# Patient Record
Sex: Female | Born: 1960
Health system: Southern US, Community
[De-identification: ages and names within clinical notes are randomized; demographics above are authoritative.]

## PROBLEM LIST (undated history)

## (undated) DIAGNOSIS — Z780 Asymptomatic menopausal state: Secondary | ICD-10-CM

## (undated) DIAGNOSIS — R011 Cardiac murmur, unspecified: Secondary | ICD-10-CM

## (undated) DIAGNOSIS — F32A Depression, unspecified: Secondary | ICD-10-CM

## (undated) DIAGNOSIS — R232 Flushing: Secondary | ICD-10-CM

## (undated) DIAGNOSIS — F329 Major depressive disorder, single episode, unspecified: Secondary | ICD-10-CM

## (undated) DIAGNOSIS — F419 Anxiety disorder, unspecified: Secondary | ICD-10-CM

## (undated) DIAGNOSIS — K922 Gastrointestinal hemorrhage, unspecified: Secondary | ICD-10-CM

## (undated) DIAGNOSIS — M1990 Unspecified osteoarthritis, unspecified site: Secondary | ICD-10-CM

## (undated) DIAGNOSIS — K259 Gastric ulcer, unspecified as acute or chronic, without hemorrhage or perforation: Secondary | ICD-10-CM

## (undated) DIAGNOSIS — N816 Rectocele: Secondary | ICD-10-CM

## (undated) DIAGNOSIS — G4700 Insomnia, unspecified: Secondary | ICD-10-CM

## (undated) DIAGNOSIS — K2970 Gastritis, unspecified, without bleeding: Secondary | ICD-10-CM

## (undated) DIAGNOSIS — M75102 Unspecified rotator cuff tear or rupture of left shoulder, not specified as traumatic: Secondary | ICD-10-CM

## (undated) DIAGNOSIS — R519 Headache, unspecified: Secondary | ICD-10-CM

## (undated) DIAGNOSIS — K219 Gastro-esophageal reflux disease without esophagitis: Secondary | ICD-10-CM

## (undated) DIAGNOSIS — D352 Benign neoplasm of pituitary gland: Secondary | ICD-10-CM

## (undated) DIAGNOSIS — N950 Postmenopausal bleeding: Secondary | ICD-10-CM

## (undated) DIAGNOSIS — Z8619 Personal history of other infectious and parasitic diseases: Secondary | ICD-10-CM

## (undated) DIAGNOSIS — K7689 Other specified diseases of liver: Secondary | ICD-10-CM

## (undated) DIAGNOSIS — K209 Esophagitis, unspecified without bleeding: Secondary | ICD-10-CM

## (undated) DIAGNOSIS — K529 Noninfective gastroenteritis and colitis, unspecified: Secondary | ICD-10-CM

## (undated) DIAGNOSIS — R61 Generalized hyperhidrosis: Secondary | ICD-10-CM

## (undated) DIAGNOSIS — Z79890 Hormone replacement therapy (postmenopausal): Secondary | ICD-10-CM

## (undated) DIAGNOSIS — T7840XA Allergy, unspecified, initial encounter: Secondary | ICD-10-CM

## (undated) DIAGNOSIS — R9389 Abnormal findings on diagnostic imaging of other specified body structures: Secondary | ICD-10-CM

## (undated) DIAGNOSIS — IMO0001 Reserved for inherently not codable concepts without codable children: Secondary | ICD-10-CM

## (undated) DIAGNOSIS — K5909 Other constipation: Secondary | ICD-10-CM

## (undated) DIAGNOSIS — D219 Benign neoplasm of connective and other soft tissue, unspecified: Secondary | ICD-10-CM

## (undated) DIAGNOSIS — D649 Anemia, unspecified: Secondary | ICD-10-CM

## (undated) DIAGNOSIS — Z9889 Other specified postprocedural states: Secondary | ICD-10-CM

## (undated) DIAGNOSIS — R4589 Other symptoms and signs involving emotional state: Secondary | ICD-10-CM

## (undated) HISTORY — DX: Reserved for inherently not codable concepts without codable children: IMO0001

## (undated) HISTORY — DX: Hormone replacement therapy: Z79.890

## (undated) HISTORY — DX: Rectocele: N81.6

## (undated) HISTORY — DX: Benign neoplasm of connective and other soft tissue, unspecified: D21.9

## (undated) HISTORY — DX: Depression, unspecified: F32.A

## (undated) HISTORY — DX: Allergy, unspecified, initial encounter: T78.40XA

## (undated) HISTORY — DX: Flushing: R23.2

## (undated) HISTORY — DX: Other specified diseases of liver: K76.89

## (undated) HISTORY — PX: APPENDECTOMY: SHX54

## (undated) HISTORY — DX: Esophagitis, unspecified without bleeding: K20.90

## (undated) HISTORY — DX: Major depressive disorder, single episode, unspecified: F32.9

## (undated) HISTORY — DX: Other symptoms and signs involving emotional state: R45.89

## (undated) HISTORY — DX: Generalized hyperhidrosis: R61

## (undated) HISTORY — DX: Gastritis, unspecified, without bleeding: K29.70

## (undated) HISTORY — DX: Other constipation: K59.09

## (undated) HISTORY — DX: Anxiety disorder, unspecified: F41.9

## (undated) HISTORY — DX: Postmenopausal bleeding: N95.0

## (undated) HISTORY — DX: Anemia, unspecified: D64.9

## (undated) HISTORY — DX: Abnormal findings on diagnostic imaging of other specified body structures: R93.89

## (undated) HISTORY — DX: Personal history of other infectious and parasitic diseases: Z86.19

## (undated) HISTORY — DX: Asymptomatic menopausal state: Z78.0

## (undated) HISTORY — DX: Insomnia, unspecified: G47.00

## (undated) HISTORY — DX: Unspecified osteoarthritis, unspecified site: M19.90

## (undated) HISTORY — DX: Gastro-esophageal reflux disease without esophagitis: K21.9

## (undated) HISTORY — DX: Cardiac murmur, unspecified: R01.1

## (undated) HISTORY — PX: TONSILECTOMY, ADENOIDECTOMY, BILATERAL MYRINGOTOMY AND TUBES: SHX2538

## (undated) HISTORY — DX: Noninfective gastroenteritis and colitis, unspecified: K52.9

## (undated) HISTORY — PX: CARPAL TUNNEL RELEASE: SHX101

## (undated) HISTORY — DX: Benign neoplasm of pituitary gland: D35.2

## (undated) HISTORY — DX: Gastric ulcer, unspecified as acute or chronic, without hemorrhage or perforation: K25.9

## (undated) HISTORY — PX: ABDOMINAL SURGERY: SHX537

## (undated) HISTORY — PX: UPPER GASTROINTESTINAL ENDOSCOPY: SHX188

---
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
## 1994-01-05 HISTORY — PX: BREAST ENHANCEMENT SURGERY: SHX7

## 1998-01-05 HISTORY — PX: AUGMENTATION MAMMAPLASTY: SUR837

## 1999-04-22 ENCOUNTER — Other Ambulatory Visit: Admission: RE | Admit: 1999-04-22 | Discharge: 1999-04-22 | Payer: Self-pay | Admitting: Family Medicine

## 2000-11-25 ENCOUNTER — Other Ambulatory Visit: Admission: RE | Admit: 2000-11-25 | Discharge: 2000-11-25 | Payer: Self-pay | Admitting: Family Medicine

## 2004-03-27 ENCOUNTER — Other Ambulatory Visit: Admission: RE | Admit: 2004-03-27 | Discharge: 2004-03-27 | Payer: Self-pay | Admitting: Family Medicine

## 2004-04-14 ENCOUNTER — Encounter: Admission: RE | Admit: 2004-04-14 | Discharge: 2004-04-14 | Payer: Self-pay | Admitting: Family Medicine

## 2005-05-07 ENCOUNTER — Other Ambulatory Visit: Admission: RE | Admit: 2005-05-07 | Discharge: 2005-05-07 | Payer: Self-pay | Admitting: Family Medicine

## 2005-05-14 ENCOUNTER — Encounter: Admission: RE | Admit: 2005-05-14 | Discharge: 2005-05-14 | Payer: Self-pay | Admitting: Family Medicine

## 2006-01-14 ENCOUNTER — Encounter: Admission: RE | Admit: 2006-01-14 | Discharge: 2006-01-14 | Payer: Self-pay | Admitting: Family Medicine

## 2007-04-21 ENCOUNTER — Encounter: Admission: RE | Admit: 2007-04-21 | Discharge: 2007-04-21 | Payer: Self-pay | Admitting: Family Medicine

## 2008-05-07 ENCOUNTER — Encounter: Admission: RE | Admit: 2008-05-07 | Discharge: 2008-05-07 | Payer: Self-pay | Admitting: Unknown Physician Specialty

## 2008-10-02 ENCOUNTER — Ambulatory Visit: Payer: Self-pay | Admitting: Gastroenterology

## 2008-10-18 ENCOUNTER — Ambulatory Visit: Payer: Self-pay | Admitting: Gastroenterology

## 2008-10-19 ENCOUNTER — Telehealth: Payer: Self-pay | Admitting: Gastroenterology

## 2008-12-21 ENCOUNTER — Telehealth: Payer: Self-pay | Admitting: Gastroenterology

## 2009-01-25 ENCOUNTER — Telehealth: Payer: Self-pay | Admitting: Gastroenterology

## 2009-03-04 ENCOUNTER — Telehealth: Payer: Self-pay | Admitting: Gastroenterology

## 2009-03-12 ENCOUNTER — Ambulatory Visit: Payer: Self-pay | Admitting: Gastroenterology

## 2009-03-18 ENCOUNTER — Telehealth: Payer: Self-pay | Admitting: Gastroenterology

## 2009-03-20 ENCOUNTER — Ambulatory Visit (HOSPITAL_COMMUNITY): Admission: RE | Admit: 2009-03-20 | Discharge: 2009-03-20 | Payer: Self-pay | Admitting: Gastroenterology

## 2009-03-20 ENCOUNTER — Ambulatory Visit: Payer: Self-pay | Admitting: Gastroenterology

## 2009-03-21 ENCOUNTER — Telehealth: Payer: Self-pay | Admitting: Gastroenterology

## 2009-03-21 ENCOUNTER — Encounter (INDEPENDENT_AMBULATORY_CARE_PROVIDER_SITE_OTHER): Payer: Self-pay | Admitting: *Deleted

## 2009-03-26 ENCOUNTER — Telehealth: Payer: Self-pay | Admitting: Gastroenterology

## 2009-03-26 ENCOUNTER — Ambulatory Visit: Payer: Self-pay | Admitting: Cardiology

## 2009-05-17 ENCOUNTER — Telehealth: Payer: Self-pay | Admitting: Gastroenterology

## 2009-08-19 ENCOUNTER — Encounter (INDEPENDENT_AMBULATORY_CARE_PROVIDER_SITE_OTHER): Payer: Self-pay | Admitting: *Deleted

## 2009-08-19 ENCOUNTER — Telehealth: Payer: Self-pay | Admitting: Gastroenterology

## 2009-09-02 ENCOUNTER — Telehealth: Payer: Self-pay | Admitting: Gastroenterology

## 2009-09-03 ENCOUNTER — Ambulatory Visit: Payer: Self-pay | Admitting: Gastroenterology

## 2009-09-03 ENCOUNTER — Telehealth: Payer: Self-pay | Admitting: Physician Assistant

## 2009-09-03 ENCOUNTER — Ambulatory Visit: Payer: Self-pay | Admitting: Cardiovascular Disease

## 2009-09-03 LAB — CONVERTED CEMR LAB
Basophils Absolute: 0 10*3/uL (ref 0.0–0.1)
Eosinophils Relative: 1.9 % (ref 0.0–5.0)
Hemoglobin: 13.7 g/dL (ref 12.0–15.0)
Lymphs Abs: 1.6 10*3/uL (ref 0.7–4.0)
MCV: 98.3 fL (ref 78.0–100.0)
Monocytes Relative: 8.1 % (ref 3.0–12.0)
Neutro Abs: 3.4 10*3/uL (ref 1.4–7.7)
WBC: 5.6 10*3/uL (ref 4.5–10.5)

## 2009-09-04 ENCOUNTER — Telehealth: Payer: Self-pay | Admitting: Physician Assistant

## 2009-09-30 ENCOUNTER — Encounter: Payer: Self-pay | Admitting: Gastroenterology

## 2009-09-30 ENCOUNTER — Encounter: Payer: Self-pay | Admitting: Physician Assistant

## 2009-09-30 ENCOUNTER — Ambulatory Visit: Payer: Self-pay | Admitting: Gastroenterology

## 2009-10-14 ENCOUNTER — Ambulatory Visit: Payer: Self-pay | Admitting: Gastroenterology

## 2009-10-17 ENCOUNTER — Encounter: Payer: Self-pay | Admitting: Gastroenterology

## 2009-10-18 ENCOUNTER — Encounter: Payer: Self-pay | Admitting: Gastroenterology

## 2009-10-25 ENCOUNTER — Telehealth: Payer: Self-pay | Admitting: Gastroenterology

## 2009-11-11 ENCOUNTER — Ambulatory Visit: Payer: Self-pay | Admitting: Gastroenterology

## 2009-11-11 DIAGNOSIS — K52839 Microscopic colitis, unspecified: Secondary | ICD-10-CM | POA: Insufficient documentation

## 2009-11-11 DIAGNOSIS — K219 Gastro-esophageal reflux disease without esophagitis: Secondary | ICD-10-CM | POA: Insufficient documentation

## 2009-11-12 ENCOUNTER — Telehealth: Payer: Self-pay | Admitting: Gastroenterology

## 2010-01-09 ENCOUNTER — Ambulatory Visit: Admit: 2010-01-09 | Payer: Self-pay | Admitting: Gastroenterology

## 2010-02-04 NOTE — Progress Notes (Signed)
Summary: CONDITION UPDATE-FLEX SCHEDULED  Phone Note Call from Patient Call back at [phone]   Caller: Patient Call For: Arlyce Dice Reason for Call: Talk to Nurse Action Taken: Patient advised to call 911 Summary of Call: Patient states that she was told to take antibiotics last Tues. and that if she did not notice any significant difference on how she felt by today to give [place] a call. Initial call taken by: Tawni Levy,  March 18, 2009 10:49 AM  Follow-up for Phone Call        F/U from OV 03-12-09--Pt. is still taking Cipro/Flagyl, continues with 4-6 watery stools daily,gas and cramping. Denies blood, black stools, fever. per OV note she needs a Sigmoidoscopy if symptoms haven't improved.   1) Flex at [facility] on 03-20-09 at 12:30pm 2) Continue antibiotics as directed 3) Continue Immodium as needed 4) If symptoms become worse call back immediately.  (All procedure instructions given to pt. by phone.) Follow-up by: Laureen Ochs LPN,  March 18, 2009 11:27 AM  Additional Follow-up for Phone Call Additional follow up Details #1::        ok Additional Follow-up by: Louis Meckel MD,  March 19, 2009 9:48 AM  New Problems: FLATULENCE ERUCTATION AND GAS PAIN (ICD-787.3)   New Problems: FLATULENCE ERUCTATION AND GAS PAIN (ICD-787.3)

## 2010-02-04 NOTE — Progress Notes (Signed)
Summary: medication info.  Phone Note Call from Patient Call back at Work Phone [phone]   Caller: Patient Call For: Mike Gip Reason for Call: Talk to Nurse Summary of Call: At our request, pt. called back with prescription information.  She takes Wellbutrin SR 150 mg. Initial call taken by: Schuyler Amor,  September 03, 2009 1:15 PM    New/Updated Medications: * WELLBUTRIN SR 150 MG 1 by mouth once daily

## 2010-02-04 NOTE — Letter (Signed)
Summary: Appt Reminder 2  [facility]  [address] [place], [place] [address]   Phone: [phone]  Fax: [phone]        August 19, 2009 MRN: [id number]    [facility] [address] [place], [place]  [address]    Dear Ms. Lalley,   You have a return appointment with Dr.Robert Arlyce Dice on 09-16-09 at 9:30am. Please remember to bring a complete list of the medicines you are taking, your insurance card and your co-pay.  If you have to cancel or reschedule this appointment, please call before 5:00 pm the evening before to avoid a cancellation fee.  If you have any questions or concerns, please call [phone].    Sincerely,    Laureen Ochs LPN  Appended Document: Appt Reminder 2 Letter mailed to patient.

## 2010-02-04 NOTE — Progress Notes (Signed)
Summary: results  Phone Note Call from Patient Call back at [phone]   Caller: Patient Call For: Dr Arlyce Dice Reason for Call: Talk to Nurse, Lab or Test Results Summary of Call: lab results Initial call taken by: Tawni Levy,  September 04, 2009 9:40 AM     Appended Document: results Please see CT report from 09-04-09 for follow-up.

## 2010-02-04 NOTE — Progress Notes (Signed)
Summary: Nexium authorization  Phone Note Outgoing Call Call back at [facility] Phone [phone]   Call placed by: Merri Ray CMA Duncan Dull),  November 12, 2009 1:32 PM Summary of Call: Called pt to inform that nexium needs prior authorization. Need to know if pt has been on another PPI. L/M for pt to R/C Initial call taken by: Merri Ray CMA Duncan Dull),  November 12, 2009 1:33 PM  Follow-up for Phone Call        Pt returned call, has not been on another PPI. Will send in Omeprazole for her to try for insuracne purposes Follow-up by: Merri Ray CMA Duncan Dull),  November 12, 2009 4:47 PM  Additional Follow-up for Phone Call Additional follow up Details #1::        FYI Dr Arlyce Dice sent pt in Omeprazole Additional Follow-up by: Merri Ray CMA Duncan Dull),  November 12, 2009 4:49 PM    New/Updated Medications: OMEPRAZOLE 40 MG CPDR (OMEPRAZOLE) 1 by mouth once daily Prescriptions: OMEPRAZOLE 40 MG CPDR (OMEPRAZOLE) 1 by mouth once daily  #30 x 2   Entered by:   Merri Ray CMA (AAMA)   Authorized by:   Louis Meckel MD   Signed by:   Merri Ray CMA (AAMA) on 11/12/2009   Method used:   [facility]        [facility] [phone]* (retail)       [address]       [place], [place]  [address]       Ph: [phone] or [phone]       Fax: [phone]   RxID:   [id number]

## 2010-02-04 NOTE — Assessment & Plan Note (Signed)
Summary: RUQ PAIN, DIARRHEA, BLOOD IN STOOLS            Catherine Short   History of Present Illness Visit Type: Follow-up Visit Primary GI MD: Melvia Heaps MD [name] Primary Provider: Belva Agee, NP Requesting Provider: NA Chief Complaint: Patient complains of diarrhea that comtains some blood. She has had some mucous. She has 4-14 BM per day. Patient complains of LLQ abdominal pain that is constant.  History of Present Illness:   Catherine Short has returned for evaluation of diarrhea.  She suffers from chronic constipation.  However, over the past 6 weeks she has had an abrupt change of bowel habits consisting of multiple watery stools daily.  She may have up to 8 stools a day and will awaken with the urge to move her bowels.  On occasion she has had bright red blood mixed with her stools.  Diarrheas is accompanied  by urgency.  There is been no change in her medications or diet.  She went .about 4 months ago.  She complains of left lower quadrant pain as well.   GI Review of Systems    Reports abdominal pain.     Location of  Abdominal pain: LLQ.    Denies acid reflux, belching, bloating, chest pain, dysphagia with liquids, dysphagia with solids, heartburn, loss of appetite, nausea, vomiting, vomiting blood, weight loss, and  weight gain.      Reports diarrhea, hemorrhoids, and  rectal bleeding.     Denies anal fissure, black tarry stools, change in bowel habit, constipation, diverticulosis, fecal incontinence, heme positive stool, irritable bowel syndrome, jaundice, light color stool, liver problems, and  rectal pain.    Current Medications (verified): 1)  Norethindrone Acetate 5 Mg Tabs (Norethindrone Acetate) .... Take 1 Tablet By Mouth Once A Day 2)  Multivitamins  Tabs (Multiple Vitamin) .... Take 1 Tablet By Mouth Once A Day 3)  Omega 3-6-9 .... Take 1 Capsule By Mouth Two Times A Day 4)  Vitamin D 400 Unit Caps (Cholecalciferol) .... Take 1 Tablet By Mouth Once A Day 5)  Calcium 1,200 Units  Capsule .... Take 1 Capsule By Mouth Once A Day 6)  Acidophilus  Caps (Lactobacillus) .... Take 1 Capsule By Mouth Once A Day 7)  Trans Resveratrol (Unknown Dosage) .... Take 1 Tablet By Mouth Once A Day 8)  Co Q-10 150 Mg Caps (Coenzyme Q10) .... Take One By Mouth Once Daily 9)  Imodium A-D 2 Mg Tabs (Loperamide Hcl) .... Take Two By Mouth Every 4-6 Hours  Allergies (verified): 1)  ! Flexeril  Past History:  Past Medical History: Last updated: 10/02/2008 Anxiety Disorder Diverticulitis  Past Surgical History: bilateral carptunnel surgery Tonsillectomy  Family History: Family History of Colon Cancer: Maternal grandfather Family History of Ovarian Cancer:Maternal grandmother Family History of Diabetes: maternal grandfather Family History of Heart Disease: father  Social History: Reviewed history from 10/02/2008 and no changes required. 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  Alcohol Use - yes  1 daily Daily Caffeine Use 1 daily Illicit Drug Use - no no smokeless tobacco  Review of Systems  The patient denies allergy/sinus, anemia, anxiety-new, arthritis/joint pain, back pain, blood in urine, breast changes/lumps, change in vision, confusion, cough, coughing up blood, depression-new, fainting, fatigue, fever, headaches-new, hearing problems, heart murmur, heart rhythm changes, itching, menstrual pain, muscle pains/cramps, night sweats, nosebleeds, pregnancy symptoms, shortness of breath, skin rash, sleeping problems, sore throat, swelling of feet/legs, swollen lymph glands, thirst - excessive ,  urination - excessive , urination changes/pain, urine leakage, vision changes, and voice change.    Vital Signs:  Patient profile:   50 year old female Height:      64 inches Weight:      130.0 pounds BMI:     22.40 Pulse rate:   60 / minute Pulse rhythm:   regular BP sitting:   110 / 60  (left arm) Cuff size:   regular  Vitals Entered By: Harlow Mares CMA Duncan Dull) (March 12, 2009 9:32 AM)  Physical Exam  Additional Exam:  On physical exam healthy-appearing female  Abdomen is without masses, tenderness organomegaly   Impression & Recommendations:  Problem # 1:  DIARRHEA (ICD-787.91) Assessment New Presumably this is due to an infectious etiology.  Idiopathic colitis is less likely.  Recommendations #1 trial of Cipro and Flagyl for 10 days #2 flexible sigmoidoscopy if symptoms are not significantly improved on empiric antibiotic therapy  Patient Instructions: 1)  CC Belva Agee, NP Prescriptions: FLAGYL 500 MG TABS (METRONIDAZOLE) take one tab t.i.d.  #30 x 0   Entered and Authorized by:   Louis Meckel MD   Signed by:   Louis Meckel MD on 03/12/2009   Method used:   Electronically to        [facility] [phone]* (retail)       [address]       [place], [place]  [address]       Ph: [phone] or [phone]       Fax: [phone]   RxID:   [id number] CIPRO 500 MG TABS (CIPROFLOXACIN HCL) take one half twice a day  #20 x 0   Entered and Authorized by:   Louis Meckel MD   Signed by:   Louis Meckel MD on 03/12/2009   Method used:   Electronically to        [facility] [phone]* (retail)       [address]       [place], [place]  [address]       Ph: [phone] or [phone]       Fax: [phone]   RxID:   [id number]

## 2010-02-04 NOTE — Procedures (Signed)
Summary: Flexible Sigmoidoscopy  Patient: Catherine Short Note: All result statuses are Final unless otherwise noted.  Tests: (1) Flexible Sigmoidoscopy (FLX)  FLX Flexible Sigmoidoscopy                             DONE     [facility]     [address] [place], [place]  [address]           FLEXIBLE SIGMOIDOSCOPY PROCEDURE REPORT           PATIENT:  Catherine, Short  MR#:  [id number]     BIRTHDATE:  08/19/1960, 48 yrs. old  GENDER:  female           ENDOSCOPIST:  Barbette Hair. Arlyce Dice, MD     Referred by:           PROCEDURE DATE:  03/20/2009     PROCEDURE:  Flexible Sigmoidoscopy, diagnostic     ASA CLASS:  Class I     INDICATIONS:  diarrhea           MEDICATIONS:   Fentanyl 75 mcg, Versed 8 mg, Benadryl 25 mg IV           DESCRIPTION OF PROCEDURE:   After the risks benefits and     alternatives of the procedure were thoroughly explained, informed     consent was obtained.  Digital rectal exam was performed and     revealed perianal skin tags.   The EC-[id number] ([id number]) endoscope     was introduced through the anus and advanced to the descending     colon, without limitations.  The quality of the prep was .  The     instrument was then slowly withdrawn as the mucosa was fully     examined.     <<PROCEDUREIMAGES>>           The area of colon examined was normal in appearance (see image001,     image002, and image003). Normal colon to 60cm  Internal     hemorrhoids were found (see image004).   Retroflexed views in the     rectum revealed no abnormalities.    The scope was then withdrawn     from the patient and the procedure terminated.           COMPLICATIONS:  None           ENDOSCOPIC IMPRESSION:     1) Normal  left colon     2) Internal hemorrhoids     RECOMMENDATIONS:     1) continue current meds     2) Call the office to schedule a followup office visit for 2     weeks     3) CT abd/pelvis           REPEAT EXAM:  No        ______________________________     Barbette Hair. Arlyce Dice, MD           CC:  Catherine Agee, NP           n.     Rosalie DoctorBarbette Hair. Kaplan at 03/20/2009 01:04 PM           Catherine, Short, [id number]  Note: An exclamation mark (!) indicates a result that was not dispersed into the flowsheet. Document Creation Date: 03/20/2009 1:05 PM _______________________________________________________________________  (1) Order result status: Final Collection or observation date-time: 03/20/2009 12:59 Requested  date-time:  Receipt date-time:  Reported date-time:  Referring Physician:   Ordering Physician: Melvia Heaps [phone]) Specimen Source:  Source: Launa Grill Order Number: [id number] Lab site:

## 2010-02-04 NOTE — Progress Notes (Signed)
Summary: TRIAGE-diarrhea,blood  Phone Note Call from Patient Call back at Home Phone [phone]   Caller: Patient Call For: Dr Arlyce Dice Reason for Call: Talk to Nurse Summary of Call: Patient states that she had rectal bleeding and abd pain on Friday and dark tarry stools in weekend. Initial call taken by: Tawni Levy,  September 02, 2009 8:26 AM  Follow-up for Phone Call        On Friday pt. had diarrhea, then  had an episode of severe cramping and rectal bleeding after running on the treadmill. States, "It happened 4 times, just poured blood." On Saturday she had "Black mushy stool"  This morning pt. has had 4 watery stools.  No blood or black stools today. Pt. c/o fatigue. Declines to use Imodium, states is doesn't help at all.   DR.STARK (DOD) PLEASE ADVISE  Follow-up by: Laureen Ochs LPN,  September 02, 2009 9:29 AM  Additional Follow-up for Phone Call Additional follow up Details #1::        Needs office appt in next 2-3 days. Can work in with extender. Additional Follow-up by: Meryl Dare MD Clementeen Graham,  September 02, 2009 9:32 AM    Additional Follow-up for Phone Call Additional follow up Details #2::    1) See [name] [name] PAC on 09-03-09 at 9am. (Please have labs done prior.) 2) Soft,bland diet. No spicy,greasy,fried foods.  3) Immodium as needed for diarrhea. 4) tylenol/Ibuprofen as needed 5) Heating pad to abdomen as needed. 6) If symptoms become worse call back immediately or go to ER.  Follow-up by: Laureen Ochs LPN,  September 02, 2009 9:46 AM

## 2010-02-04 NOTE — Letter (Signed)
Summary: Results Letter  [facility]  [address] [place], [place] [address]   Phone: [phone]  Fax: [phone]        October 17, 2009 MRN: [id number]    [facility] [address] [place], [place]  [address]    Dear Ms. Delawder,   Your colon biopsies demonstrated inflammatory changes.  These changes could account for the change in her bowel habits.  My nurse will be calling him a prescription for you.  I would like to see you again in approximately 2 weeks.  Please feel free to contact me if you have any questions.  Sincerely,  Barbette Hair. Arlyce Dice, M.D., [facility]          Sincerely,  Louis Meckel MD  This letter has been electronically signed by your physician.  Appended Document: Results Letter letter mailed

## 2010-02-04 NOTE — Letter (Signed)
Summary: [facility]  [address] [place], [place] [address]   Phone: [phone]  Fax: [phone]       Catherine Short    [birth year]    MRN: [id number]        Procedure Day /Date:MONDAY 10/14/2009     Arrival Time:1PM      Procedure Time:2PM     Location of Procedure:                    X  [facility] (4th Floor)    PREPARATION FOR FLEXIBLE SIGMOIDOSCOPY WITH MAGNESIUM CITRATE  Prior to the day before your procedure, purchase one 8 oz. bottle of Magnesium Citrate and one Fleet Enema from the laxative section of your drugstore.  _________________________________________________________________________________________________  THE DAY BEFORE YOUR PROCEDURE             DATE:10/13/2009    DAY: SUNDAY  1.   Have a clear liquid dinner the night before your procedure.  2.   Do not drink anything colored red or purple.  Avoid juices with pulp.  No orange juice.              CLEAR LIQUIDS INCLUDE: Water Jello Ice Popsicles Tea (sugar ok, no milk/cream) Powdered fruit flavored drinks Coffee (sugar ok, no milk/cream) Gatorade Juice: apple, white grape, white cranberry  Lemonade Clear bullion, consomm, broth Carbonated beverages (any kind) Strained chicken noodle soup Hard Candy   3.   At 7:00 pm the night before your procedure, drink one bottle of Magnesium Citrate over ice.  4.   Drink at least 3 more glasses of clear liquids before bedtime (preferably juices).  5.   Results are expected usually within 1 to 6 hours after taking the Magnesium Citrate.  ___________________________________________________________________________________________________  THE DAY OF YOUR PROCEDURE            DATE:10/14/2009   DAY: MONDAY  1.   Use Fleet Enema one hour prior to coming for procedure.  2.   You may drink clear liquids until 12PM(2 hours before exam)      MEDICATION INSTRUCTIONS  Unless otherwise instructed, you  should take regular prescription medications with a small sip of water as early as possible the morning of your procedure.          OTHER INSTRUCTIONS  You will need a responsible adult at least 48 years of age to accompany you and drive you home.   This person must remain in the waiting room during your procedure.  Wear loose fitting clothing that is easily removed.  Leave jewelry and other valuables at home.  However, you may wish to bring a book to read or an iPod/MP3 player to listen to music as you wait for your procedure to start.  Remove all body piercing jewelry and leave at home.  Total time from sign-in until discharge is approximately 2-3 hours.  You should go home directly after your procedure and rest.  You can resume normal activities the day after your procedure.  The day of your procedure you should not:   Drive   Make legal decisions   Operate machinery   Drink alcohol   Return to work  You will receive specific instructions about eating, activities and medications before you leave.   The above instructions have been reviewed and explained to me by   _______________________    I fully understand and can verbalize these instructions _____________________________ Date _________

## 2010-02-04 NOTE — Assessment & Plan Note (Signed)
Summary: 2 week f/u -rs and starting Lialda   History of Present Illness Visit Type: Follow-up Visit Primary GI MD: Melvia Heaps MD [facility] Primary Provider: Belva Agee, NP Requesting Provider: NA Chief Complaint: follow up starting Lialda, pt states she is headed in the right direction, but symptoms have still not completely resolved History of Present Illness:   Catherine Short has returned for followup of her diarrhea.  Flexible sigmoidoscopy was negative but biopsies were consistent with microscopic colitis.  On lialda 2.4 g daily her symptoms have significantly improved.  She still has diarrhea when she runs.  Otherwise stools have normalized.   GI Review of Systems      Denies abdominal pain, acid reflux, belching, bloating, chest pain, dysphagia with liquids, dysphagia with solids, heartburn, loss of appetite, nausea, vomiting, vomiting blood, weight loss, and  weight gain.        Denies anal fissure, black tarry stools, change in bowel habit, constipation, diarrhea, diverticulosis, fecal incontinence, heme positive stool, hemorrhoids, irritable bowel syndrome, jaundice, light color stool, liver problems, rectal bleeding, and  rectal pain.    Current Medications (verified): 1)  Norethindrone Acetate 5 Mg Tabs (Norethindrone Acetate) .... Take 1 Tablet By Mouth Once A Day 2)  Multivitamins  Tabs (Multiple Vitamin) .... Take 1 Tablet By Mouth Once A Day 3)  Omega 3-6-9 .... Take 1 Capsule By Mouth Two Times A Day 4)  Vitamin D 400 Unit Caps (Cholecalciferol) .... Take 1 Tablet By Mouth Once A Day 5)  Calcium 1,200 Units Capsule .... Take 1 Capsule By Mouth Once A Day 6)  Acidophilus  Caps (Lactobacillus) .... Take 1 Capsule By Mouth Once A Day 7)  Imodium A-D 2 Mg Tabs (Loperamide Hcl) .... As Needed 8)  Wellbutrin Sr 150 Mg .[name] Kitchen.. 1 By Mouth Once Daily 9)  Clonazepam 0.5 Mg Tabs (Clonazepam) .... 1/2 Tablet As Needed 10)  Loradamed 10 Mg Tabs (Loratadine) .[name] Kitchen.. 1 By Mouth Once Daily 11)   Cataflam 50 Mg Tabs (Diclofenac Potassium) .[name] Kitchen.. 1 By Mouth As Needed 12)  Bentyl 10 Mg  Caps (Dicyclomine Hcl) .... Take 1 Twice Daily As Needed For Abd.cramping/spasm. 13)  Lialda 1.2 Gm Tbec (Mesalamine) .... 2 By Mouth Every Am  Allergies (verified): 1)  ! Flexeril  Past History:  Past Medical History: Reviewed history from 09/30/2009 and no changes required. Anxiety Disorder FLATULENCE ERUCTATION AND GAS PAIN (ICD-787.3) DIARRHEA (ICD-787.91) CONSTIPATION (ICD-564.00) RECTAL BLEEDING (ICD-569.3)  Past Surgical History: Reviewed history from 03/12/2009 and no changes required. bilateral carptunnel surgery Tonsillectomy  Family History: Reviewed history from 03/12/2009 and no changes required. Family History of Colon Cancer: Maternal grandfather Family History of Ovarian Cancer:Maternal grandmother Family History of Diabetes: maternal grandfather Family History of Heart Disease: father  Social History: Reviewed history from 10/02/2008 and no changes required. 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  Alcohol Use - yes  1 daily Daily Caffeine Use 1 daily Illicit Drug Use - no no smokeless tobacco  Review of Systems  The patient denies allergy/sinus, anemia, anxiety-new, arthritis/joint pain, back pain, blood in urine, breast changes/lumps, confusion, cough, coughing up blood, depression-new, fainting, fatigue, fever, headaches-new, hearing problems, heart murmur, heart rhythm changes, itching, menstrual pain, muscle pains/cramps, night sweats, nosebleeds, pregnancy symptoms, shortness of breath, skin rash, sleeping problems, sore throat, swelling of feet/legs, swollen lymph glands, thirst - excessive, urination - excessive, urination changes/pain, urine leakage, vision changes, and voice change.    Vital Signs:  Patient profile:  50 year old female Height:      64 inches Weight:      132 pounds BMI:     22.74 Pulse rate:   60 /  minute Pulse rhythm:   regular BP sitting:   100 / 66  (left arm) Cuff size:   regular  Vitals Entered By: Francee Piccolo CMA Duncan Dull) (November 11, 2009 10:18 AM)   Impression & Recommendations:  Problem # 1:  OTH&UNSPEC NONINFECTIOUS GASTROENTERITIS&COLITIS (ICD-558.9) Her diarrhea is due to microscopic colitis.  She has had a good response to lialda.  Recommendations #1 the patient was instructed to take either Pepto-Bismol or Imodium 1-2 hours prior to running.  She will continue lialda.  Problem # 2:  GERD (ICD-530.81) Symptoms are well-controlled with Nexium.  Patient Instructions: 1)  Copy sent to : Belva Agee, NP 2)  You will follow up in 6 weeks 3)  You prescription will be at your pharmacy today 4)  The medication list was reviewed and reconciled.  All changed / newly prescribed medications were explained.  A complete medication list was provided to the patient / caregiver. Prescriptions: NEXIUM 40 MG CPDR (ESOMEPRAZOLE MAGNESIUM) take one tab daily  #30 x 2   Entered and Authorized by:   Louis Meckel MD   Signed by:   Louis Meckel MD on 11/11/2009   Method used:   [facility] to        [facility] [phone]* (retail)       [address]       [place], [place]  [address]       Ph: [phone] or [phone]       Fax: [phone]   RxID:   [id number]

## 2010-02-04 NOTE — Letter (Signed)
Summary: Appt Reminder 2  [facility]  [address] [place], [place] [address]   Phone: [phone]  Fax: [phone]        March 21, 2009 MRN: [id number]    [facility] [address] [place], [place]  [address]    Dear Ms. Gearheart,   You have a return appointment with Dr.Robert Arlyce Dice on 04-17-09 at 8:30am. Please remember to bring a complete list of the medicines you are taking, your insurance card and your co-pay.  If you have to cancel or reschedule this appointment, please call before 5:00 pm the evening before to avoid a cancellation fee.  If you have any questions or concerns, please call [phone].    Sincerely,    Laureen Ochs LPN  Appended Document: Appt Reminder 2 Letter mailed to patient.

## 2010-02-04 NOTE — Progress Notes (Signed)
Summary: Meds causing headaches  Phone Note Call from Patient Call back at [phone]   Call For: Dr Arlyce Dice Summary of Call: Medicine Lialda is causing her to have headaches and she believes she was told she could not mix with ibuprofen. What else can she take? Initial call taken by: Le[facility],  October 25, 2009 8:57 AM  Follow-up for Phone Call        Patient  has not HA in the am she thinks this might be from [place].  I have advised that she can try Tylenol.  I have asked her to call us back if her symptoms continue on Lialda. Follow-up by: Darcey Nora RN, CGRN,  October 25, 2009 10:04 AM  Additional Follow-up for Phone Call Additional follow up Details #1::        tylenol for heachaches.  If that's not sufficient she can d/c lialda for 3-4 days to see if heachaches improve.  If so we can try alternative meds Additional Follow-up by: Louis Meckel MD,  October 25, 2009 2:39 PM    Additional Follow-up for Phone Call Additional follow up Details #2::    Patient  advised of Dr Marzetta Board recommendations.  She will call us back with an update. Follow-up by: Darcey Nora RN, CGRN,  October 25, 2009 3:24 PM

## 2010-02-04 NOTE — Progress Notes (Signed)
Summary: CT/REV SCHEDULED  Phone Note Outgoing Call Call back at [phone]   Call placed by: Laureen Ochs LPN,  March 21, 2009 10:29 AM Call placed to: Patient Summary of Call: Pt. is scheduled for a CT at [facility] CT on 03-26-09 at 8:30am and an REV with Dr.Ivy Puryear on 04-17-09 at 8:30am. Appt. info. reviewed by phone and pt. will pick-up contrast/nstructions tomorrow. Pt. instructed to call back as needed.  Initial call taken by: Laureen Ochs LPN,  March 21, 2009 10:30 AM

## 2010-02-04 NOTE — Assessment & Plan Note (Signed)
Summary: ABD. PAIN/BLEEDING          (DR.KAPLAN PT.)  Catherine Short   History of Present Illness Visit Type: Follow-up Visit Primary GI MD: Melvia Heaps MD [facility] Primary Provider: Belva Agee, NP Requesting Provider: NA Chief Complaint: abdominal pain, rectal bleeding, black tarry stools and dizziness. History of Present Illness:   PLEASANT 49 YO FEMAEL KNOWN TO DR. KAPLAN. SHE UNDERWENT COLONOSCOPY 10/10 WHICH WAS NORMAL. SHE COMES IN TODAY WITH C/O ONGOING PROBLEMS WITH DIARRHEA,SAYS SHE HAS NOT HAD ANY NORMAL BM'S IN MONTHS AND THAT HER STOOLS ARE MUSHY,4-5 BM'S/DAY. SHE HAD AN EPISODE ON 8/27 WITH BAD CRAMPY ABDOMINAL PAIN,DIAPHORESIS,CHILLS ,NAUSEA,THEN COMPLETELY EVACUATED BOWELS WITH MULTIPLE BM'S EVENTUALLY TURNING BLOODY. THIS OCCURRED AFTER SHE HAD FINISHED RUNNING ON THE TREADMI[place] FOR AN HOUR.. SHE FELT BAD THE NEXT COUPLE DAYS, STOOLS DARK,NOW CLEARING. SHE SAYS SHE ALWAYS HAS MORE DIARRHEA ON THE DAYS SHE RUNS,BUT HAS NOT HAD SUCH BAD CRAMPING AND BLEEDING IN THE PAST. HER WEIGHT IS DOWN 7 POUNDS IN THE PAST 3 MONTHS. SHE ALSO WORRIES ABOUT PARASITES ETC AS SHE KAYAKS  FREQUENTLY. SHE IS ON ESTROGEN BECAUSE OF OVARIAN CYSTS.   GI Review of Systems    Reports abdominal pain and  nausea.     Location of  Abdominal pain: generalized.    Denies acid reflux, belching, bloating, chest pain, dysphagia with liquids, dysphagia with solids, heartburn, loss of appetite, vomiting, vomiting blood, weight loss, and  weight gain.      Reports black tarry stools, change in bowel habits, diarrhea, and  rectal bleeding.     Denies anal fissure, constipation, diverticulosis, fecal incontinence, heme positive stool, hemorrhoids, irritable bowel syndrome, jaundice, light color stool, liver problems, and  rectal pain.    Current Medications (verified): 1)  Norethindrone Acetate 5 Mg Tabs (Norethindrone Acetate) .... Take 1 Tablet By Mouth Once A Day 2)  Multivitamins  Tabs (Multiple Vitamin) .... Take 1  Tablet By Mouth Once A Day 3)  Omega 3-6-9 .... Take 1 Capsule By Mouth Two Times A Day 4)  Vitamin D 400 Unit Caps (Cholecalciferol) .... Take 1 Tablet By Mouth Once A Day 5)  Calcium 1,200 Units Capsule .... Take 1 Capsule By Mouth Once A Day 6)  Acidophilus  Caps (Lactobacillus) .... Take 1 Capsule By Mouth Once A Day 7)  Imodium A-D 2 Mg Tabs (Loperamide Hcl) .... Take Two By Mouth Every 4-6 Hours 8)  Wellbutrin (Unsure of Strength) .[name] Kitchen.. 1 By Mouth Once Daily 9)  Buspirone Hcl 10 Mg Tabs (Buspirone Hcl) .[name] Kitchen.. 1 By Mouth Two Times A Day 10)  Loradamed 10 Mg Tabs (Loratadine) .[name] Kitchen.. 1 By Mouth Once Daily 11)  Cataflam 50 Mg Tabs (Diclofenac Potassium) .[name] Kitchen.. 1 By Mouth As Needed  Allergies (verified): 1)  ! Flexeril  Past History:  Past Medical History: Anxiety Disorder  Past Surgical History: Reviewed history from 03/12/2009 and no changes required. bilateral carptunnel surgery Tonsillectomy  Family History: Reviewed history from 03/12/2009 and no changes required. Family History of Colon Cancer: Maternal grandfather Family History of Ovarian Cancer:Maternal grandmother Family History of Diabetes: maternal grandfather Family History of Heart Disease: father  Social History: Reviewed history from 10/02/2008 and no changes required. 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  Alcohol Use - yes  1 daily Daily Caffeine Use 1 daily Illicit Drug Use - no no smokeless tobacco  Review of Systems       The patient complains of fatigue and night sweats.  The patient denies allergy/sinus, anemia, anxiety-new, arthritis/joint pain, back pain, blood in urine, breast changes/lumps, change in vision, confusion, cough, coughing up blood, depression-new, fainting, fever, headaches-new, hearing problems, heart murmur, heart rhythm changes, itching, muscle pains/cramps, nosebleeds, shortness of breath, skin rash, sleeping problems, sore throat, swelling of feet/legs,  swollen lymph glands, thirst - excessive, urination - excessive, urination changes/pain, urine leakage, vision changes, and voice change.         SEE HPI  Vital Signs:  Patient profile:   50 year old female Height:      64 inches Weight:      125.50 pounds BMI:     21.62 Pulse rate:   60 / minute Pulse rhythm:   regular BP sitting:   102 / 66  (left arm)  Vitals Entered By: Milford Cage NCMA (September 03, 2009 9:07 AM)  Physical Exam  General:  Well developed, well nourished, no acute distress. Head:  Normocephalic and atraumatic. Eyes:  PERRLA, no icterus. Lungs:  Clear throughout to auscultation. Heart:  Regular rate and rhythm; no murmurs, rubs,  or bruits. Abdomen:  SOFT,TONED, BS+,MILD TENDERNESS ACROSS LOWER ABDOMEN, AND LLQ,BS+ Rectal:  NOT DONE Extremities:  No clubbing, cyanosis, edema or deformities noted. Neurologic:  Alert and  oriented x4;  grossly normal neurologically. Psych:  Alert and cooperative. Normal mood and affect.   Impression & Recommendations:  Problem # 1:  DIARRHEA (ICD-787.91) Assessment Deteriorated 49 YO FEMALE WITH PERSISTENT DIARRHEA,ABDOMINAL CRAMPING-NOW WITH WEIGHT LOSS. SXS ARE EXACEREBATED BY EXERCISE. EPISODE 8/26 WORRISOME FOR SEGMENTAL ISCHEMIC COLITIS. NEGATIVE COLON 10/10.   R/O IBS,R/O GIARDIASIS.  CT ABD/PELVIS TODAY TODAY R/O ISCHEMIC COLITIS. START FLAGYL 250 MG 4 X DAILY X 10 DAYS. PUSH FLUIDS,BACK OFF ON INTENSE EXERCISE IN THE SHOERT TERM LABS AS BELOW. FURTHER PLANS PENDING CT. FOLLOW UP DR.KAPLAN IN 2 WEEKS. Orders: T-Sprue Panel (Celiac Disease Aby Eval) ([id number]) T-Culture, Stool ([id number]) T-Culture, C-Diff Toxin A/B ([id number]) T-Stool Giardia / Crypto- EIA (95188) T-Stool for O&P ([id number]) T-Fecal WBC ([id number]) GI Misc Procedure/ Radiology Order (GI Misc ) TLB-Sedimentation Rate (ESR) ([id number]-[id number])  Other Orders: TLB-CRP-High Sensitivity (C-Reactive Protein) (86140-FCRP)  Patient  Instructions: 1)  We have ordered the CT scan at [facility] CT. 2)  Directions and contrast provided. 3)  Please go to lab, basement level. 4)  We sent a prescription  for Flagyl to [facility] 5)  Copy Sent To: Belva Agee NP 6)  The medication list was reviewed and reconciled.  All changed / newly prescribed medications were explained.  A complete medication list was provided to the patient / caregiver.  Prescriptions: FLAGYL 250 MG TABS (METRONIDAZOLE) Take 1 tab 4 times daily x 10 days  #40 x 0   Entered by:   Lowry Ram NCMA   Authorized by:   Sammuel Cooper PA-c   Signed by:   Lowry Ram NCMA on 09/03/2009   Method used:   [facility] to        [facility] [phone]* (retail)       [address]       [place], [place]  [address]       Ph: [phone] or [phone]       Fax: [phone]   RxID:   [id number]   Appended Document: ABD. PAIN/BLEEDING          (DR.KAPLAN PT.)  Catherine Short Warden Buffa: I am pretty sure this patient has never been seen at our  office at [facility] Belva Agee, NP, is listed as her PCP if that is helpful.

## 2010-02-04 NOTE — Miscellaneous (Signed)
  Clinical Lists Changes  Medications: Rx of LIALDA 1.2 GM TBEC (MESALAMINE) 2 by mouth every am;  #60 x 3;  Signed;  Entered by: Merri Ray CMA (AAMA);  Authorized by: Louis Meckel MD;  Method used: Printed then faxed to [facility] [phone]*, [address], [place], [place], [place]  [address], Ph: [phone] or [phone], Fax: [phone]    Prescriptions: LIALDA 1.2 GM TBEC (MESALAMINE) 2 by mouth every am  #60 x 3   Entered by:   Merri Ray CMA (AAMA)   Authorized by:   Louis Meckel MD   Signed by:   Merri Ray CMA (AAMA) on 10/18/2009   Method used:   Printed then faxed to ...       [facility]  [address] [phone]* (retail)       [address]       [place], [place]  [address]       Ph: [phone] or [phone]       Fax: [phone]   RxID:   [id number]

## 2010-02-04 NOTE — Progress Notes (Signed)
Summary: TRIAGE  Phone Note Call from Patient Call back at [phone]   Caller: Patient Call For: Dr. Arlyce Dice Reason for Call: Talk to Nurse Summary of Call: half-way through running routine yesterday she felt nausea and then started burping a lot and felt like she might vomit... about an hour later she had a BM and it looked like coffee grinds Initial call taken by: Vallarie Mare,  May 17, 2009 8:39 AM  Follow-up for Phone Call        Pt. states she became very nauseated while running yesterday. Took a few Maalox and drank a beer, thought it may relieve the gas/bloating.  She had 2-3 runny,black "It looked just like coffee grounds" stools, took 2 immodium. Pt. is outof town, cannot be seen today. States "I feel fine today, I was just worried about what happened yesterday"  1) If symptoms become worse, go to the nearest ER for evaluation.  2) Soft,bland diet. No spicy,greasy,fried foods. Advance diet as tolerated. 3) Immodium as needed for diarrhea. 4) Gas-x,Phazyme, etc. as needed for gas & bloating. 5) Call back on Monday with an update, sooner as needed.  DR.JACOBS-(DOD) PLEASE REVIEW.   Follow-up by: Laureen Ochs LPN,  May 17, 2009 8:49 AM  Additional Follow-up for Phone Call Additional follow up Details #1::        I agree Additional Follow-up by: Rachael Fee MD,  May 17, 2009 9:05 AM

## 2010-02-04 NOTE — Progress Notes (Signed)
Summary: TRIAGE  Phone Note Call from Patient Call back at [phone]   Caller: Patient Call For: Dr. Arlyce Dice Reason for Call: Talk to Nurse Summary of Call: pt having same symptoms as previously noted in last December EMR phone note... pt would like to know what to do this time  Initial call taken by: Vallarie Mare,  January 25, 2009 3:28 PM  Follow-up for Phone Call        Pt. c/o diarrhea, stools are loose to watery. "It doesn't matter what I eat, I go 3 to 8 times a day now, sometimes it is almost impossible to get to a bathroom without messing myself, and there is so much gas it is embarrassing"  Pt. states she takes up to 3 Immodium daily, some improvement, but not much. States she has tried [employer] and other products, they do not work. Pt. declines an OV, states she cannot afford it.  [facility] PLEASE REVIEW AND ADVISE   Follow-up by: Laureen Ochs LPN,  January 25, 2009 4:16 PM  Additional Follow-up for Phone Call Additional follow up Details #1::        She should stop any bowel stimulants.  Fiber daily.  To stop her diarrhea she can take 2 Imodium every 6 hours until it is stopped then take no more Additional Follow-up by: Louis Meckel MD,  January 28, 2009 10:01 AM    Additional Follow-up for Phone Call Additional follow up Details #2::    Message left for patient to callback. Laureen Ochs LPN  January 28, 2009 10:03 AM  Above MD orders reviewed with patient. Pt. to call back in one week with an update, sooner as needed. Follow-up by: Laureen Ochs LPN,  January 28, 2009 10:33 AM

## 2010-02-04 NOTE — Procedures (Signed)
Summary: Flexible Sigmoidoscopy  Patient: Catherine Short Note: All result statuses are Final unless otherwise noted.  Tests: (1) Flexible Sigmoidoscopy (FLX)  FLX Flexible Sigmoidoscopy                             DONE (C)     [facility]     [address]     [place], [place]  [address]           FLEXIBLE SIGMOIDOSCOPY PROCEDURE REPORT           PATIENT:  Catherine Short, Catherine Short  MR#:  [id number]     BIRTHDATE:  [birth year], 49 yrs. old  GENDER:  female           ENDOSCOPIST:  Barbette Hair. Arlyce Dice, MD     Referred by:           PROCEDURE DATE:  10/14/2009     PROCEDURE:  Flexible Sigmoidoscopy with biopsy     ASA CLASS:  Class I     INDICATIONS:  diarrhea           MEDICATIONS:   Fentanyl 100 mcg IV, Versed 10 mg IV           DESCRIPTION OF PROCEDURE:   After the risks benefits and     alternatives of the procedure were thoroughly explained, informed     consent was obtained.  Digital rectal exam was performed and     revealed no abnormalities.   The [id number] endoscope     was introduced through the anus and advanced to the splenic     flexure, without limitations.  The quality of the prep was .  The     instrument was then slowly withdrawn as the mucosa was fully     examined.     <<PROCEDUREIMAGES>>           The area of colon examined was normal in appearance (see image1,     image2, and image3).  Internal hemorrhoids were ween. Random     biopsies were taken to r/o microscopic colitis   Retroflexed views     in the rectum revealed no abnormalities.    The scope was then     withdrawn from the patient and the procedure terminated.           COMPLICATIONS:  None           ENDOSCOPIC IMPRESSION:     1) Normal colon     2) Internal hemorrhoids     RECOMMENDATIONS:     1) await biopsy results     2) Call the office to schedule a followup office visit for 2     weeks           REPEAT EXAM:  No           ______________________________     Barbette Hair.  Arlyce Dice, MD           CC:  Belva Agee, NP           n.     REVISED:  10/14/2009 03:28 PM     eSIGNED:   Barbette Hair. Kaplan at 10/14/2009 03:28 PM           [place], [place], [phone]  Note: An exclamation mark (!) indicates a result that was not dispersed into the flowsheet. Document Creation Date: 10/14/2009 3:28 PM _______________________________________________________________________  (1) Order result status: Final Collection or  observation date-time: 10/14/2009 15:22 Requested date-time:  Receipt date-time:  Reported date-time:  Referring Physician:   Ordering Physician: Melvia Heaps [phone]) Specimen Source:  Source: Launa Grill Order Number: [id number] Lab site:

## 2010-02-04 NOTE — Progress Notes (Signed)
Summary: TRIAGE  Phone Note Call from Patient   Caller: Patient Summary of Call: Pt. began Cipro/Flagyl on 03-12-09. She took them until 03-19-09, "I thought I was supposed to stop them" Pt. states that on 03-20-09 she began with leg cramps, states she cannot run like she usually does due to the cramping, wonders if it is from the antibiotics.   [facility] PLEASE ADVISE  Initial call taken by: Laureen Ochs LPN,  March 26, 2009 10:54 AM  Follow-up for Phone Call        I can't be sure.  She should take tonic water.  Is she still having diarrhea? CT scan shows no abnormalities though she needs gyn f/u for a possible fibroid Follow-up by: Louis Meckel MD,  March 26, 2009 12:04 PM  Additional Follow-up for Phone Call Additional follow up Details #1::        Message left for patient to callback. Laureen Ochs LPN  March 26, 2009 1:01 PM  Above MD orders reviewed with patient. Pt. continues with daily,multiple watery stools. Has stopped all fiber and Immodium. I have faxed her CT report to her GYN., she will call and make an appt. for follow-up of possible fibroid. I offered pt. a sooner OV with Dr.Kaplan, she declined, so she will see him on 04-17-09 at 9:15am.  1) Take 1-2 Immodium every morning and as needed throughout the day. 2) Begin a daily fiber supplement. 3) I will call pt., if new orders, after MD reviews. 4) Pt. instructed to call back as needed.  Additional Follow-up by: Laureen Ochs LPN,  March 26, 2009 2:19 PM    Additional Follow-up for Phone Call Additional follow up Details #2::    ok Follow-up by: Louis Meckel MD,  March 26, 2009 2:40 PM

## 2010-02-04 NOTE — Assessment & Plan Note (Signed)
Summary: F/U FROM APPT. W/[name]            Catherine   History of Present Illness Visit Type: Follow-up Visit Primary GI MD: Melvia Heaps MD [facility] Primary Provider: Belva Agee, NP Requesting Provider: NA Chief Complaint: F/u from visit with [name] for rectal bleeding, black tarry stools, and abd pain.  Pt states that she is having loose stools but is feeling much better and denies any other GI complaints  History of Present Illness:   Ms. Short has returned  for evaluation of diarrhea and bleeding.  She developed loose stools several months ago.  For years she has had problems with constipation.  She stopped running 4 weeks ago.  She now has poorly formed but more solid stools accompanied by urgency but no bleeding.  She took a course of Flagyl without change in her symptoms.  Clearly, with running she will develop loose stools and frequent rectal bleeding.  CT Scan was unremarkable.  There has been no significant change in her medications or her diet.   GI Review of Systems      Denies abdominal pain, acid reflux, belching, bloating, chest pain, dysphagia with liquids, dysphagia with solids, heartburn, loss of appetite, nausea, vomiting, vomiting blood, weight loss, and  weight gain.        Denies anal fissure, black tarry stools, change in bowel habit, constipation, diarrhea, diverticulosis, fecal incontinence, heme positive stool, hemorrhoids, irritable bowel syndrome, jaundice, light color stool, liver problems, rectal bleeding, and  rectal pain.    Current Medications (verified): 1)  Norethindrone Acetate 5 Mg Tabs (Norethindrone Acetate) .... Take 1 Tablet By Mouth Once A Day 2)  Multivitamins  Tabs (Multiple Vitamin) .... Take 1 Tablet By Mouth Once A Day 3)  Omega 3-6-9 .... Take 1 Capsule By Mouth Two Times A Day 4)  Vitamin D 400 Unit Caps (Cholecalciferol) .... Take 1 Tablet By Mouth Once A Day 5)  Calcium 1,200 Units Capsule .... Take 1 Capsule By Mouth Once A Day 6)  Acidophilus   Caps (Lactobacillus) .... Take 1 Capsule By Mouth Once A Day 7)  Imodium A-D 2 Mg Tabs (Loperamide Hcl) .... As Needed 8)  Wellbutrin Sr 150 Mg .[name] Kitchen.. 1 By Mouth Once Daily 9)  Buspirone Hcl 10 Mg Tabs (Buspirone Hcl) .[name] Kitchen.. 1 By Mouth Two Times A Day 10)  Loradamed 10 Mg Tabs (Loratadine) .[name] Kitchen.. 1 By Mouth Once Daily 11)  Cataflam 50 Mg Tabs (Diclofenac Potassium) .[name] Kitchen.. 1 By Mouth As Needed 12)  Bentyl 10 Mg  Caps (Dicyclomine Hcl) .... Take 1 Twice Daily As Needed For Abd.cramping/spasm.  Allergies (verified): 1)  ! Flexeril  Past History:  Past Medical History: Anxiety Disorder FLATULENCE ERUCTATION AND GAS PAIN (ICD-787.3) DIARRHEA (ICD-787.91) CONSTIPATION (ICD-564.00) RECTAL BLEEDING (ICD-569.3)  Past Surgical History: Reviewed history from 03/12/2009 and no changes required. bilateral carptunnel surgery Tonsillectomy  Family History: Reviewed history from 03/12/2009 and no changes required. Family History of Colon Cancer: Maternal grandfather Family History of Ovarian Cancer:Maternal grandmother Family History of Diabetes: maternal grandfather Family History of Heart Disease: father  Social History: Reviewed history from 10/02/2008 and no changes required. 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  Alcohol Use - yes  1 daily Daily Caffeine Use 1 daily Illicit Drug Use - no no smokeless tobacco  Review of Systems  The patient denies allergy/sinus, anemia, anxiety-new, arthritis/joint pain, back pain, blood in urine, breast changes/lumps, change in vision, confusion, cough, coughing up blood,  depression-new, fainting, fatigue, fever, headaches-new, hearing problems, heart murmur, heart rhythm changes, itching, menstrual pain, muscle pains/cramps, night sweats, nosebleeds, pregnancy symptoms, shortness of breath, skin rash, sleeping problems, sore throat, swelling of feet/legs, swollen lymph glands, thirst - excessive , urination - excessive ,  urination changes/pain, urine leakage, vision changes, and voice change.    Vital Signs:  Patient profile:   50 year old female Height:      64 inches Weight:      125 pounds BMI:     21.53 BSA:     1.60 Pulse rate:   64 / minute Pulse rhythm:   regular BP sitting:   100 / 62  (left arm) Cuff size:   regular  Vitals Entered By: Ok Anis CMA (September 30, 2009 10:30 AM)   Impression & Recommendations:  Problem # 1:  DIARRHEA (ICD-787.91)  I am suspicious that she develops ischemic colitis during her marathon running.  This does not explain her more chronic symptoms in the absence a running. Microscopic colitis is a possibility.  Enteric infection is less likely in view of the longevity of her symptoms and lack response to empiric therapy with Flagyl.  Recommendations #1 the patient is going to run several miles and then come in later that day for sigmoidoscopy.  If no changes are seen then biopsies will be taken to rule out microscopic colitis #2 check stool studies for ova and P., C. diffl, lactoferrin and C&S  Orders: Flex with Sedation (Flex w/Sed)  Patient Instructions: 1)  Copy sent to : Belva Agee, NP 2)  Conscious Sedation brochure given. 3)  Your Flex sig is scheduled for 10/14/2009 at 2pm  4)  The medication list was reviewed and reconciled.  All changed / newly prescribed medications were explained.  A complete medication list was provided to the patient / caregiver.

## 2010-02-04 NOTE — Progress Notes (Signed)
Summary: Triage  Phone Note Call from Patient Call back at Home Phone [phone]   Caller: Patient Call For: Dr. Arlyce Dice Reason for Call: Talk to Nurse Summary of Call: Pt is on a high fiber diet and she has always been constipated and now she has diarrhea. Initial call taken by: Karna Christmas,  March 04, 2009 12:11 PM  Follow-up for Phone Call        Message left for patient to callback. Laureen Ochs LPN  March 04, 2009 12:32 PM   Pt. c/o a pain under her right ribs when she runs, thinks it is gas pain, states it also causes diarrhea. Uses immodium, not helping. Sees blood in stools after she runs. Wants to be seen ASAP, declines an appt. until after 03-09-09.  Wants labs to be done for Celiac and Crohn's.  1) Pt. will see Dr.Adonte Vanriper on 03-12-09 at 9:30am 2) Soft,bland diet. No spicy,greasy,fried foods.  3) Avoid running until you see Dr.Frankie Zito. 4) If symptoms become worse call back immediately or go to ER.  Follow-up by: Laureen Ochs LPN,  March 04, 2009 3:09 PM  Additional Follow-up for Phone Call Additional follow up Details #1::        ok Additional Follow-up by: Louis Meckel MD,  March 04, 2009 4:58 PM

## 2010-02-04 NOTE — Progress Notes (Signed)
Summary: TRIAGE  Phone Note Call from Patient Call back at [phone]   Caller: Patient Call For: Dr. Arlyce Dice Reason for Call: Talk to Nurse Summary of Call: pt would like more instruction on a bland diet Initial call taken by: Vallarie Mare,  August 19, 2009 9:13 AM  Follow-up for Phone Call        Parke Simmers diet phamplet reviewed and mailed to pt. She continues with episodes of pain after running and episodes of diarrhea. She cancelled her last follow-up with Dr.Marquice Uddin, she has now scheduled an appt. to see him on 09-16-09 at 9:30am. Pt. instructed to call back as needed.  Follow-up by: Laureen Ochs LPN,  August 19, 2009 10:59 AM

## 2010-02-10 ENCOUNTER — Ambulatory Visit: Payer: Self-pay | Admitting: Gastroenterology

## 2010-03-31 LAB — CBC
HCT: 41.2 % (ref 36.0–46.0)
MCHC: 33.8 g/dL (ref 30.0–36.0)
MCV: 97.6 fL (ref 78.0–100.0)
RDW: 12.8 % (ref 11.5–15.5)

## 2010-03-31 LAB — COMPREHENSIVE METABOLIC PANEL
ALT: 27 U/L (ref 0–35)
AST: 33 U/L (ref 0–37)
Albumin: 3.4 g/dL — ABNORMAL LOW (ref 3.5–5.2)
Alkaline Phosphatase: 21 U/L — ABNORMAL LOW (ref 39–117)
Creatinine, Ser: 0.76 mg/dL (ref 0.4–1.2)
GFR calc Af Amer: >60 mL/min (ref >60)
Sodium: 141 mEq/L (ref 135–145)
Total Bilirubin: 1.2 mg/dL (ref 0.3–1.2)

## 2010-10-30 ENCOUNTER — Encounter: Payer: Self-pay | Admitting: Orthopedic Surgery

## 2010-10-30 ENCOUNTER — Ambulatory Visit (INDEPENDENT_AMBULATORY_CARE_PROVIDER_SITE_OTHER): Payer: BC Managed Care – PPO | Admitting: Orthopedic Surgery

## 2010-10-30 VITALS — BP 102/70 | Ht 64.0 in | Wt 132.0 lb

## 2010-10-30 DIAGNOSIS — M75102 Unspecified rotator cuff tear or rupture of left shoulder, not specified as traumatic: Secondary | ICD-10-CM | POA: Insufficient documentation

## 2010-10-30 DIAGNOSIS — M7710 Lateral epicondylitis, unspecified elbow: Secondary | ICD-10-CM

## 2010-10-30 DIAGNOSIS — M62838 Other muscle spasm: Secondary | ICD-10-CM

## 2010-10-30 DIAGNOSIS — M719 Bursopathy, unspecified: Secondary | ICD-10-CM

## 2010-10-30 DIAGNOSIS — M67919 Unspecified disorder of synovium and tendon, unspecified shoulder: Secondary | ICD-10-CM

## 2010-10-30 HISTORY — DX: Unspecified rotator cuff tear or rupture of left shoulder, not specified as traumatic: M75.102

## 2010-10-30 MED ORDER — METHOCARBAMOL 500 MG PO TABS: 500.0000 mg | ORAL_TABLET | Freq: Every day | ORAL | Status: DC

## 2010-10-30 NOTE — Patient Instructions (Signed)
You have received a steroid shot. 15% of patients experience increased pain at the injection site with in the next 24 hours. This is best treated with ice and tylenol extra strength 2 tabs every 8 hours. If you are still having pain please call the office.    Wear tennis elbow brace

## 2010-10-31 ENCOUNTER — Encounter: Payer: Self-pay | Admitting: Orthopedic Surgery

## 2010-10-31 MED ORDER — METHOCARBAMOL 500 MG PO TABS: 500.0000 mg | ORAL_TABLET | Freq: Every day | ORAL | Status: AC

## 2010-10-31 MED ORDER — METHYLPREDNISOLONE ACETATE 40 MG/ML IJ SUSP: 40.0000 mg | Freq: Once | INTRAMUSCULAR | Status: DC

## 2010-10-31 NOTE — Progress Notes (Signed)
   New patient previously followed and he [place] a 50 year old female complains of pain in her LEFT arm radiating from her LEFT shoulder across her LEFT elbow and into her LEFT wrist which started about 6 months ago.  She has a history of tennis elbow.  She has not lost any range of motion.  The pain in the elbows on the lateral epicondyle.  The pain in the wrist is over the LEFT thumb.  She is aching in the LEFT shoulder.  Review of systems history diarrhea or excessive thirst seasonal ALLERGIES denies chest pain blurred vision shortness of breath heartburn frequency skin changes numbness nervousness easy bleeding and expected weight loss.  Past Surgical History  Procedure Date  . Carpal tunnel release   . Tonsilectomy, adenoidectomy, bilateral myringotomy and tubes    Past Medical History  Diagnosis Date  . Colitis   . Prolactinoma    Vital signs are stable.  Gen. Appearance normal, excellent grooming and hygiene muscular body habitus.  She is oriented x3 she is a pleasant mood and affect.  Her gait and station are normal.  The lower extremities are normal on inspection with no contracture subluxation atrophy or tremor and normal skin  The RIGHT upper extremity has full range of motion, normal strength, all joints are reduced.  No contractures are seen.  No swelling is seen.  Skin is normal.  The cervical spine is tender at the base of the cervical spine there is normal range of motion and a negative Spurling sign with increased muscle tension on both sides of the spine and the trapezius muscle.  There is full range of motion of the shoulder elbow and wrist.  There is tenderness in the supraspinatus fossa and posterior and inferior to the acromion.  There is tenderness along the lateral epicondyle of the elbow.  There is some tenderness at the base of the thumb the LEFT hand.  Provocative tests For the shoulder shows a positive Neer sign for impingement at 130 flexion.   There is tenderness over the lateral epicondyle of the elbow with a positive wrist extension test.  Impression #1Spasms of the cervical spine #2 rotator cuff syndrome  #3 tennis elbow  Subacromial Shoulder Injection Procedure Note  Pre-operative Diagnosis: left RC Syndrome  Post-operative Diagnosis: same  Indications: pain   Anesthesia: ethyl chloride   Procedure Details   Verbal consent was obtained for the procedure. The shoulder was prepped withalcohol and the skin was anesthetized. A 20 gauge needle was advanced into the subacromial space through posterior approach without difficulty  The space was then injected with 3 ml 1% lidocaine and 1 ml of depomedrol. The injection site was cleansed with isopropyl alcohol and a dressing was applied.  Complications:  None; patient tolerated the procedure well.  Diagnosis LEFT tennis elbow Posterior diagnosis same Indications pain Anesthesia 2 chloride Procedure details  Verbal consent was obtained for the procedure.  Time out was taken.  The skin over the LEFT elbow was prepped with alcohol.  A 25-gauge needle was advanced at the point of maximal tenderness and 3 mL of 1% lidocaine and 1 mL of 40 mg Depo-Medrol per cc was injected.  The procedure was tolerated well  Complications none.

## 2010-12-18 ENCOUNTER — Encounter: Payer: Self-pay | Admitting: Orthopedic Surgery

## 2010-12-18 ENCOUNTER — Ambulatory Visit (INDEPENDENT_AMBULATORY_CARE_PROVIDER_SITE_OTHER): Payer: BC Managed Care – PPO | Admitting: Orthopedic Surgery

## 2010-12-18 VITALS — BP 100/64 | Ht 64.5 in | Wt 134.0 lb

## 2010-12-18 DIAGNOSIS — M7712 Lateral epicondylitis, left elbow: Secondary | ICD-10-CM

## 2010-12-18 DIAGNOSIS — M7710 Lateral epicondylitis, unspecified elbow: Secondary | ICD-10-CM

## 2010-12-18 MED ORDER — CYCLOBENZAPRINE HCL 5 MG PO TABS: 5.0000 mg | ORAL_TABLET | Freq: Every day | ORAL | Status: AC

## 2010-12-18 NOTE — Progress Notes (Signed)
Patient ID: Catherine Short, female   DOB: 1960-05-17, 50 y.o.   MRN: [id number] Her LEFT shoulder LEFT elbow  LEFT shoulder rotator cuff syndrome  LEFT elbow lateral epicondylitis  She could not tolerate the Robaxin although it helped her shoulder symptoms and her neck pain.  She would like a cervical pillow.  She did not tolerate the tennis elbow brace so she has stopped using  General an injection of therapy at this time  Exam shows tenderness over the lateral epicondyle painful hyperextension test of the long finger and painful wrist extension test with elbow extension.  Range of motion is full she has pain with hyperextension of the elbow.  Elbow stable.  No neurologic deficits no lymph nodes normal skin  Full range of motion LEFT shoulder  Impression rotator cuff syndrome Impression continued lateral epicondylitis  Plan recommend physical therapy for the elbow  modified activities for the shoulder  stop the Robaxin  start Flexeril continue Cataflam

## 2010-12-18 NOTE — Patient Instructions (Signed)
Start physical therapy   Continue cataflam  Stop robaxin start flexeril

## 2010-12-26 ENCOUNTER — Telehealth: Payer: Self-pay | Admitting: Orthopedic Surgery

## 2010-12-26 ENCOUNTER — Ambulatory Visit: Payer: BC Managed Care – PPO | Attending: Orthopedic Surgery | Admitting: Physical Therapy

## 2010-12-26 DIAGNOSIS — IMO0001 Reserved for inherently not codable concepts without codable children: Secondary | ICD-10-CM | POA: Insufficient documentation

## 2010-12-26 DIAGNOSIS — R5381 Other malaise: Secondary | ICD-10-CM | POA: Insufficient documentation

## 2010-12-26 DIAGNOSIS — M25529 Pain in unspecified elbow: Secondary | ICD-10-CM | POA: Insufficient documentation

## 2010-12-26 NOTE — Telephone Encounter (Signed)
Heather from [facility], Ph [phone], called to relay that patient has appointment today, 12/26/10.  States needs the order to indicate physical therapy -- original order indicates occupational therapy only.  Copy retained + copy sent to scanning center.  Order updated, noted, and re-faxed to them at [phone].

## 2010-12-31 ENCOUNTER — Ambulatory Visit: Payer: BC Managed Care – PPO | Admitting: Physical Therapy

## 2011-01-02 ENCOUNTER — Ambulatory Visit: Payer: BC Managed Care – PPO | Admitting: Physical Therapy

## 2011-01-07 NOTE — Telephone Encounter (Signed)
noted 

## 2011-02-12 ENCOUNTER — Ambulatory Visit: Payer: BC Managed Care – PPO | Admitting: Orthopedic Surgery

## 2012-08-18 ENCOUNTER — Other Ambulatory Visit (INDEPENDENT_AMBULATORY_CARE_PROVIDER_SITE_OTHER): Payer: BC Managed Care – PPO

## 2012-08-18 DIAGNOSIS — E229 Hyperfunction of pituitary gland, unspecified: Secondary | ICD-10-CM

## 2012-08-18 DIAGNOSIS — Z131 Encounter for screening for diabetes mellitus: Secondary | ICD-10-CM

## 2012-08-18 DIAGNOSIS — E785 Hyperlipidemia, unspecified: Secondary | ICD-10-CM

## 2012-08-18 NOTE — Progress Notes (Signed)
Pt came in for labs and an order from dr.mcleod at [facility] in [place]

## 2012-08-19 LAB — PROLACTIN: Prolactin: 33.8 ng/mL — ABNORMAL HIGH (ref 4.8–23.3)

## 2012-08-19 LAB — LIPID PANEL
Cholesterol, Total: 145 mg/dL (ref 100–199)
LDL Calculated: 66 mg/dL (ref 0–99)
Triglycerides: 57 mg/dL (ref 0–149)
VLDL Cholesterol Cal: 11 mg/dL (ref 5–40)

## 2012-08-24 ENCOUNTER — Telehealth: Payer: Self-pay | Admitting: Family Medicine

## 2012-08-26 NOTE — Telephone Encounter (Signed)
Results given to pt and faxed to [facility] whom ordered it

## 2012-11-14 ENCOUNTER — Ambulatory Visit (INDEPENDENT_AMBULATORY_CARE_PROVIDER_SITE_OTHER): Payer: BC Managed Care – PPO | Admitting: Gastroenterology

## 2012-11-14 ENCOUNTER — Encounter: Payer: Self-pay | Admitting: Gastroenterology

## 2012-11-14 VITALS — BP 98/64 | HR 64 | Ht 64.0 in | Wt 128.6 lb

## 2012-11-14 DIAGNOSIS — K5900 Constipation, unspecified: Secondary | ICD-10-CM | POA: Insufficient documentation

## 2012-11-14 NOTE — Progress Notes (Signed)
History of Present Illness: 52 year old white female with history of constipation and microscopic colitis here for reevaluation of constipation.  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  Because of diarrhea,  in 2011 she underwent sigmoidoscopy which was normal.  Biopsies demonstrated changes consistent with microscopic colitis.  She took Lialda for a period of time with resolution of symptoms.  She's been off lialda for a couple of years.  She's now back with constipation.  She may go 4-5 days without a bowel movement.  With constipation she has abdominal discomfort which is relieved with defecation.  There is no history of melena or hematochezia.  Past Medical History  Diagnosis Date  . Colitis   . Prolactinoma    Past Surgical History  Procedure Laterality Date  . Carpal tunnel release    . Tonsilectomy, adenoidectomy, bilateral myringotomy and tubes     family history includes Diabetes in an other family member; Heart disease in an other family member. Current Outpatient Prescriptions  Medication Sig Dispense Refill  . Loratadine (CLARITIN PO) Take by mouth.        [name] Kitchen PROGESTERONE MICRONIZED PO Take by mouth.         Current Facility-Administered Medications  Medication Dose Route Frequency Provider Last Rate Last Dose  . methylPREDNISolone acetate (DEPO-MEDROL) injection 40 mg  40 mg Intra-articular Once Vickki Hearing, MD      . methylPREDNISolone acetate (DEPO-MEDROL) injection 40 mg  40 mg Intra-articular Once Vickki Hearing, MD       Allergies as of 11/14/2012 - Review Complete 11/14/2012  Allergen Reaction Noted  . Cyclobenzaprine hcl  10/02/2008    reports that she has never smoked. She has never used smokeless tobacco. She reports that she drinks alcohol. She reports that she does not use illicit drugs.     Review of Systems: Pertinent positive and negative review of systems were noted in the above HPI section. All other review of  systems were otherwise negative.  Vital signs were reviewed in today's medical record Physical Exam: General: Well developed , well nourished, no acute distress Skin: anicteric Head: Normocephalic and atraumatic Eyes:  sclerae anicteric, EOMI Ears: Normal auditory acuity Mouth: No deformity or lesions Neck: Supple, no masses or thyromegaly Lungs: Clear throughout to auscultation Heart: Regular rate and rhythm; no murmurs, rubs or bruits Abdomen: Soft, non tender and non distended. No masses, hepatosplenomegaly or hernias noted. Normal Bowel sounds Rectal:deferred Musculoskeletal: Symmetrical with no gross deformities  Skin: No lesions on visible extremities Pulses:  Normal pulses noted Extremities: No clubbing, cyanosis, edema or deformities noted Neurological: Alert oriented x 4, grossly nonfocal Cervical Nodes:  No significant cervical adenopathy Inguinal Nodes: No significant inguinal adenopathy Psychological:  Alert and cooperative. Normal mood and affect

## 2012-11-14 NOTE — Patient Instructions (Addendum)
We are giving you Linzess samples today.  Stop Linzess and call office if you develop diarrhea per

## 2012-11-14 NOTE — Assessment & Plan Note (Addendum)
Pt has chronic idiopathic constipation.  While she has a history of microscopic colitis, this was associated with diarrhea.  Recommendations #1 trial of Linzess 145 grams daily

## 2012-11-28 ENCOUNTER — Telehealth: Payer: Self-pay | Admitting: Gastroenterology

## 2012-11-28 MED ORDER — LINACLOTIDE 290 MCG PO CAPS: 290.0000 ug | ORAL_CAPSULE | Freq: Every day | ORAL | Status: DC

## 2012-11-28 NOTE — Telephone Encounter (Signed)
Called in script for pt 

## 2012-11-29 MED ORDER — LINACLOTIDE 290 MCG PO CAPS: 290.0000 ug | ORAL_CAPSULE | Freq: Every day | ORAL | Status: DC

## 2012-11-29 NOTE — Telephone Encounter (Signed)
Linzess 90 day suplly sent to [facility]

## 2013-01-12 ENCOUNTER — Ambulatory Visit (INDEPENDENT_AMBULATORY_CARE_PROVIDER_SITE_OTHER): Payer: BC Managed Care – PPO | Admitting: Family Medicine

## 2013-01-12 VITALS — BP 122/79 | HR 64 | Temp 99.1°F | Ht 64.0 in | Wt 130.0 lb

## 2013-01-12 DIAGNOSIS — H6990 Unspecified Eustachian tube disorder, unspecified ear: Secondary | ICD-10-CM

## 2013-01-12 DIAGNOSIS — J329 Chronic sinusitis, unspecified: Secondary | ICD-10-CM

## 2013-01-12 DIAGNOSIS — R51 Headache: Secondary | ICD-10-CM

## 2013-01-12 DIAGNOSIS — H6980 Other specified disorders of Eustachian tube, unspecified ear: Secondary | ICD-10-CM

## 2013-01-12 MED ORDER — MONTELUKAST SODIUM 10 MG PO TABS: 10.0000 mg | ORAL_TABLET | Freq: Every day | ORAL | Status: DC

## 2013-01-12 MED ORDER — LEVOFLOXACIN 500 MG PO TABS: 500.0000 mg | ORAL_TABLET | Freq: Every day | ORAL | Status: DC

## 2013-01-12 MED ORDER — KETOROLAC TROMETHAMINE 30 MG/ML IJ SOLN
30.0000 mg | Freq: Once | INTRAMUSCULAR | Status: AC
  Administered 2013-01-12: 30 mg via INTRAMUSCULAR

## 2013-01-12 MED ORDER — METHYLPREDNISOLONE ACETATE 80 MG/ML IJ SUSP
80.0000 mg | Freq: Once | INTRAMUSCULAR | Status: AC
  Administered 2013-01-12: 80 mg via INTRAMUSCULAR

## 2013-01-12 MED ORDER — FLUTICASONE PROPIONATE 50 MCG/ACT NA SUSP: 2.0000 | Freq: Every day | NASAL | Status: DC

## 2013-01-12 NOTE — Progress Notes (Signed)
   Subjective:    Patient ID: Catherine Short, female    DOB: 09/18/1960, 53 y.o.   MRN: [id number]  HPI This 53 y.o. female presents for evaluation of URI sx's for over 3 weeks.  She has  Been having persistent cough.  She has been having some ear discomfot and sore throat. She has been having allergy symptoms and she has ETD sx's.  She has headache today.   Review of Systems C/o uri sx's and bilateral ear discomfort.  C/o sinus pressure and mucopurulent sinus drainage. No chest pain, SOB, HA, dizziness, vision change, N/V, diarrhea, constipation, dysuria, urinary urgency or frequency, myalgias, arthralgias or rash.     Objective:   Physical Exam  Vital signs noted  Well developed well nourished female.  HEENT - Head atraumatic Normocephalic                Eyes - PERRLA, Conjuctiva - clear Sclera- Clear EOMI                Ears - EAC's Wnl TM's Wnl Gross Hearing WNL                Nose - Nares patent                 Throat - oropharanx injected                Face - TTP bilateral maxillary sinuses Respiratory - Lungs CTA bilateral Cardiac - RRR S1 and S2 without murmur GI - Abdomen soft Nontender and bowel sounds active x 4 Extremities - No edema. Neuro - Grossly intact.      Assessment & Plan:  Sinusitis - Plan: levofloxacin (LEVAQUIN) 500 MG tablet, fluticasone (FLONASE) 50 MCG/ACT nasal spray, methylPREDNISolone acetate (DEPO-MEDROL) injection 80 mg, montelukast (SINGULAIR) 10 MG tablet  Headache(784.0) - Plan: ketorolac (TORADOL) 30 MG/ML injection 30 mg, montelukast (SINGULAIR) 10 MG tablet  ETD (eustachian tube dysfunction), unspecified laterality - Plan: montelukast (SINGULAIR) 10 MG tablet. Push po fluids, rest, tylenol and motrin otc prn as directed for fever, arthralgias, and myalgias.  Follow up prn if sx's continue or persist.  Lysbeth Penner FNP

## 2013-01-12 NOTE — Patient Instructions (Addendum)
Ketorolac injection What is this medicine? KETOROLAC (kee toe ROLE ak) is a non-steroidal anti-inflammatory drug (NSAID). It is used to treat moderate to severe pain for up to 5 days. It is commonly used after surgery. This medicine should not be used for more than 5 days. This medicine may be used for other purposes; ask your health care provider or pharmacist if you have questions. COMMON BRAND NAME(S): Toradol What should I tell my health care provider before I take this medicine? They need to know if you have any of these conditions: -asthma, especially aspirin-sensitive asthma -bleeding problems -kidney disease -stomach bleed, ulcer, or other problem -taking aspirin, other NSAID, or probenecid -an unusual or allergic reaction to ketorolac, tromethamine, aspirin, other NSAIDs, other medicines, foods, dyes or preservatives -pregnant or trying to get pregnant -breast-feeding How should I use this medicine? This medicine is for injection into a muscle or into a vein. It is given by a health care professional in a hospital or clinic setting. Talk to your pediatrician regarding the use of this medicine in children. While this drug may be prescribed for children as young as 74 years old for selected conditions, precautions do apply. Patients over 44 years old may have a stronger reaction and need a smaller dose. Overdosage: If you think you have taken too much of this medicine contact a poison control center or emergency room at once. NOTE: This medicine is only for you. Do not share this medicine with others. What if I miss a dose? This does not apply. What may interact with this medicine? Do not take this medicine with any of the following medications: -aspirin and aspirin-like medicines -cidofovir -methotrexate -NSAIDs, medicines for pain and inflammation, like ibuprofen or naproxen -pentoxifylline -probenecid This medicine may also interact with the following  medications: -alcohol -alendronate -alprazolam -carbamazepine -diuretics -flavocoxid -fluoxetine -ginkgo -lithium -medicines for blood pressure like enalapril -medicines that affect platelets like pentoxifylline -medicines that treat or prevent blood clots like heparin, warfarin -muscle relaxants -pemetrexed -phenytoin -thiothixene This list may not describe all possible interactions. Give your health care provider a list of all the medicines, herbs, non-prescription drugs, or dietary supplements you use. Also tell them if you smoke, drink alcohol, or use illegal drugs. Some items may interact with your medicine. What should I watch for while using this medicine? Tell your doctor or healthcare professional if your symptoms do not start to get better or if they get worse. This medicine does not prevent heart attack or stroke. In fact, this medicine may increase the chance of a heart attack or stroke. The chance may increase with longer use of this medicine and in people who have heart disease. If you take aspirin to prevent heart attack or stroke, talk with your doctor or health care professional. Do not take medicines such as ibuprofen and naproxen with this medicine. Side effects such as stomach upset, nausea, or ulcers may be more likely to occur. Many medicines available without a prescription should not be taken with this medicine. This medicine can cause ulcers and bleeding in the stomach and intestines at any time during treatment. Do not smoke cigarettes or drink alcohol. These increase irritation to your stomach and can make it more susceptible to damage from this medicine. Ulcers and bleeding can happen without warning symptoms and can cause death. This medicine can cause you to bleed more easily. Try to avoid damage to your teeth and gums when you brush or floss your teeth. What side effects may  I notice from receiving this medicine? Side effects that you should report to your  doctor or health care professional as soon as possible: -allergic reactions like skin rash, itching or hives, swelling of the face, lips, or tongue -black or tarry stools -breathing problems -changes in vision -chest pain -high blood pressure -nausea, vomiting -redness, blistering, peeling or loosening of the skin, including inside the mouth -severe abdominal pain -slurred speech or weakness on one side of the body -trouble passing urine or change in the amount of urine -unexplained weight gain or swelling -unusual bleeding or bruising -unusually weak or tired -yellowing of eyes or skin Side effects that usually do not require medical attention (report to your doctor or health care professional if they continue or are bothersome): -diarrhea -dizziness -headache -heartburn This list may not describe all possible side effects. Call your doctor for medical advice about side effects. You may report side effects to FDA at 1-800-FDA-1088. Where should I keep my medicine? This drug is given in a hospital or clinic and will not be stored at home. NOTE: This sheet is a summary. It may not cover all possible information. If you have questions about this medicine, talk to your doctor, pharmacist, or health care provider.  2014, Elsevier/Gold Standard. (2007-05-12 17:24:50)   Sinusitis Sinusitis is redness, soreness, and swelling (inflammation) of the paranasal sinuses. Paranasal sinuses are air pockets within the bones of your face (beneath the eyes, the middle of the forehead, or above the eyes). In healthy paranasal sinuses, mucus is able to drain out, and air is able to circulate through them by way of your nose. However, when your paranasal sinuses are inflamed, mucus and air can become trapped. This can allow bacteria and other germs to grow and cause infection. Sinusitis can develop quickly and last only a short time (acute) or continue over a long period (chronic). Sinusitis that lasts for  more than 12 weeks is considered chronic.  CAUSES  Causes of sinusitis include:  Allergies.  Structural abnormalities, such as displacement of the cartilage that separates your nostrils (deviated septum), which can decrease the air flow through your nose and sinuses and affect sinus drainage.  Functional abnormalities, such as when the small hairs (cilia) that line your sinuses and help remove mucus do not work properly or are not present. SYMPTOMS  Symptoms of acute and chronic sinusitis are the same. The primary symptoms are pain and pressure around the affected sinuses. Other symptoms include:  Upper toothache.  Earache.  Headache.  Bad breath.  Decreased sense of smell and taste.  A cough, which worsens when you are lying flat.  Fatigue.  Fever.  Thick drainage from your nose, which often is green and may contain pus (purulent).  Swelling and warmth over the affected sinuses. DIAGNOSIS  Your caregiver will perform a physical exam. During the exam, your caregiver may:  Look in your nose for signs of abnormal growths in your nostrils (nasal polyps).  Tap over the affected sinus to check for signs of infection.  View the inside of your sinuses (endoscopy) with a special imaging device with a light attached (endoscope), which is inserted into your sinuses. If your caregiver suspects that you have chronic sinusitis, one or more of the following tests may be recommended:  Allergy tests.  Nasal culture A sample of mucus is taken from your nose and sent to a lab and screened for bacteria.  Nasal cytology A sample of mucus is taken from your nose and  examined by your caregiver to determine if your sinusitis is related to an allergy. TREATMENT  Most cases of acute sinusitis are related to a viral infection and will resolve on their own within 10 days. Sometimes medicines are prescribed to help relieve symptoms (pain medicine, decongestants, nasal steroid sprays, or saline  sprays).  However, for sinusitis related to a bacterial infection, your caregiver will prescribe antibiotic medicines. These are medicines that will help kill the bacteria causing the infection.  Rarely, sinusitis is caused by a fungal infection. In theses cases, your caregiver will prescribe antifungal medicine. For some cases of chronic sinusitis, surgery is needed. Generally, these are cases in which sinusitis recurs more than 3 times per year, despite other treatments. HOME CARE INSTRUCTIONS   Drink plenty of water. Water helps thin the mucus so your sinuses can drain more easily.  Use a humidifier.  Inhale steam 3 to 4 times a day (for example, sit in the bathroom with the shower running).  Apply a warm, moist washcloth to your face 3 to 4 times a day, or as directed by your caregiver.  Use saline nasal sprays to help moisten and clean your sinuses.  Take over-the-counter or prescription medicines for pain, discomfort, or fever only as directed by your caregiver. SEEK IMMEDIATE MEDICAL CARE IF:  You have increasing pain or severe headaches.  You have nausea, vomiting, or drowsiness.  You have swelling around your face.  You have vision problems.  You have a stiff neck.  You have difficulty breathing. MAKE SURE YOU:   Understand these instructions.  Will watch your condition.  Will get help right away if you are not doing well or get worse. Document Released: 12/22/2004 Document Revised: 03/16/2011 Document Reviewed: 01/06/2011 [facility] Patient Information 2014 [place], [place].

## 2013-02-22 ENCOUNTER — Telehealth: Payer: Self-pay | Admitting: Family Medicine

## 2013-02-22 ENCOUNTER — Other Ambulatory Visit: Payer: Self-pay | Admitting: Family Medicine

## 2013-02-22 DIAGNOSIS — J329 Chronic sinusitis, unspecified: Secondary | ICD-10-CM

## 2013-02-22 MED ORDER — LEVOFLOXACIN 500 MG PO TABS: 500.0000 mg | ORAL_TABLET | Freq: Every day | ORAL | Status: DC

## 2013-02-22 NOTE — Telephone Encounter (Signed)
Sent in another round of levaquin and if this doesn't help she needs to see ENT.  Follow up if not better

## 2013-02-23 MED ORDER — LEVOFLOXACIN 500 MG PO TABS: 500.0000 mg | ORAL_TABLET | Freq: Every day | ORAL | Status: DC

## 2013-02-23 NOTE — Addendum Note (Signed)
Addended by: Ilean China on: 02/23/2013 03:20 PM   Modules accepted: Orders

## 2013-03-10 ENCOUNTER — Other Ambulatory Visit: Payer: Self-pay | Admitting: Physician Assistant

## 2013-10-10 ENCOUNTER — Telehealth: Payer: Self-pay | Admitting: Gastroenterology

## 2013-10-10 NOTE — Telephone Encounter (Signed)
Movantik is for narcotic-induced constipation.  Is she taking chronic narcotics?  If not she can try increasing Linzess to 290 mcg daily and call back in one to 2 weeks

## 2013-10-10 NOTE — Telephone Encounter (Signed)
Patient c/o constipation despite Linzess. She takes herbal laxative daily and occasionally take Miralax, but reports stool is hard "like rocks". She has an appointment 11/23/13 for follow up. She asks if she can try Movantik prior to the appointment. She also reports indigestion and burping but will discuss that at her appointment. Please advise on the Movantik.

## 2013-10-10 NOTE — Telephone Encounter (Signed)
She is not on any chronic pain meds of any sort. I scheduled her to come in and discuss options. She declines to try Linzess again.

## 2013-10-16 ENCOUNTER — Ambulatory Visit (INDEPENDENT_AMBULATORY_CARE_PROVIDER_SITE_OTHER): Payer: BC Managed Care – PPO | Admitting: Physician Assistant

## 2013-10-16 ENCOUNTER — Encounter: Payer: Self-pay | Admitting: Physician Assistant

## 2013-10-16 VITALS — BP 118/76 | HR 76 | Ht 64.0 in | Wt 128.8 lb

## 2013-10-16 DIAGNOSIS — R140 Abdominal distension (gaseous): Secondary | ICD-10-CM

## 2013-10-16 DIAGNOSIS — R110 Nausea: Secondary | ICD-10-CM

## 2013-10-16 DIAGNOSIS — K5909 Other constipation: Secondary | ICD-10-CM

## 2013-10-16 DIAGNOSIS — K5900 Constipation, unspecified: Secondary | ICD-10-CM

## 2013-10-16 NOTE — Progress Notes (Signed)
Subjective:    Patient ID: Catherine Short, female    DOB: February 24, 1960, 53 y.o.   MRN: [id number]  HPI Svara is a pleasant 53 year old white female known to Dr. Deatra Ina. She has history of chronic constipation and also carries a diagnosis of microscopic colitis made in 2011 during an episode of diarrhea. She had sigmoidoscopy with random biopsies. She was treated with a course of [place] all the and has not had recurrence. Patient comes in today with ongoing complaints of constipation. She says she has been constipated her entire life. She had been given a trial of Linzess about a year ago and says that it worked for a week or 2 and then stopped working and she was back to having a bowel movement every 4-5 days despite the Linzess. More recently she has been taking a combination of a senna laxative and a  colon cleanse laxative containing cascara. She says she started on Paxil for anxiety about a week ago and has noticed a significant increase in frequency of bowel movements and has been going every day. She had also recently taken a course of Levaquin. She says if she is constipated she'll have problems with abdominal bloating gas and sometimes postprandial queasiness and right-sided discomfort. Generally if bowels are purged she does not have any symptoms. She wonders about her gallbladder. She also wanted to make sure that it was safe to take laxatives on a regular basis. She has been taking a probiotic. Family history negative for colon cancer and inflammatory bowel disease as far she is aware.    Review of Systems  Constitutional: Negative.   HENT: Negative.   Eyes: Negative.   Respiratory: Negative.   Cardiovascular: Negative.   Gastrointestinal: Positive for abdominal pain and constipation.  Endocrine: Negative.   Genitourinary: Negative.   Musculoskeletal: Negative.   Allergic/Immunologic: Negative.   Neurological: Negative.   Hematological: Negative.   Psychiatric/Behavioral: Negative.      Outpatient Prescriptions Prior to Visit  Medication Sig Dispense Refill  . fluticasone (FLONASE) 50 MCG/ACT nasal spray Place 2 sprays into both nostrils daily.  16 g  6  . levofloxacin (LEVAQUIN) 500 MG tablet Take 1 tablet (500 mg total) by mouth daily.  14 tablet  0  . Loratadine (CLARITIN PO) Take by mouth.        . montelukast (SINGULAIR) 10 MG tablet Take 1 tablet (10 mg total) by mouth at bedtime.  90 tablet  3  . PROGESTERONE MICRONIZED PO Take by mouth.        . Linaclotide (LINZESS) 290 MCG CAPS capsule Take 1 capsule (290 mcg total) by mouth daily.  90 capsule  3   Facility-Administered Medications Prior to Visit  Medication Dose Route Frequency Provider Last Rate Last Dose  . methylPREDNISolone acetate (DEPO-MEDROL) injection 40 mg  40 mg Intra-articular Once Carole Civil, MD      . methylPREDNISolone acetate (DEPO-MEDROL) injection 40 mg  40 mg Intra-articular Once Carole Civil, MD       Allergies  Allergen Reactions  . Cyclobenzaprine Hcl     REACTION: disoriented   Patient Active Problem List   Diagnosis Date Noted  . Unspecified constipation 11/14/2012  . Tennis elbow 10/30/2010  . Rotator cuff syndrome of left shoulder 10/30/2010  . Muscle spasms of neck 10/30/2010  . GERD 11/11/2009  . Microscopic colitis 11/11/2009   History  Substance Use Topics  . Smoking status: Never Smoker   . Smokeless tobacco: Never Used  .  Alcohol Use: Yes      family history includes Diabetes in an other family member; Heart disease in an other family member.  Objective:   Physical Exam  well-developed white female in no acute distress, pleasant blood pressure 118/76 pulse 76 height 5 foot 4 weight 128. HEENT; nontraumatic normocephalic EOMI PERRLA sclera anicteric, Supple ;no JVD, Cardiovascular; regular rate and rhythm with S1-S2 no murmur or gallop, Pulmonary ;clear bilaterally, Abdomen; soft nontender nondistended bowel sounds are active there is no palpable mass  or hepatosplenomegaly, Rectal; exam not done, Extremities ;no clubbing cyanosis or edema skin warm dry, Psych; mood and affect normal and appropriate         Assessment & Plan:  #33  53 year old female with long-standing chronic constipation refractory to trial of Linzess #2 history of microscopic colitis, quiescent  since 2011, #3 colon neoplasia screening patient had colonoscopy October 2010 negative with exception of internal hemorrhoids and melanosis  Plan; Continue a daily probiotic Will give her a trial of Amitiza 24 mcg twice daily. Can decrease dosage if needed We'll check upper abdominal ultrasound do to bloating and postprandial nausea complaints. Patient will follow up with Dr. Deatra Ina or myself as needed.

## 2013-10-16 NOTE — Patient Instructions (Addendum)
We have given you samples of Amitiza 24 mcg capsules.  Take 1 daily. After 5-6 days call us with a progress report. We can decide if this dose is working or if you need the lower dose, 38mcg.   We can then send a prescription to your pharmacy.  Take a daily probiotic. Suggestions are: Sharlotte Alamo, Florastor. You can get these at your pharmacy. [facility].  You have been scheduled for an abdominal ultrasound at [facility] (1st floor of hospital) on Thursday 10-21 at 8:30 am . Please arrive  At 8:15 am. prior to your appointment for registration. Make certain not to have anything to eat or drink 6 hours prior to your appointment. Should you need to reschedule your appointment, please contact radiology at [phone]. This test typically takes about 30 minutes to perform.

## 2013-10-17 NOTE — Progress Notes (Signed)
If patient is moving her bowels more regularly since starting Paxil Amitiza may not be necessary.

## 2013-10-19 ENCOUNTER — Ambulatory Visit (HOSPITAL_COMMUNITY): Payer: BC Managed Care – PPO

## 2013-10-25 ENCOUNTER — Ambulatory Visit (HOSPITAL_COMMUNITY): Payer: BC Managed Care – PPO

## 2013-11-23 ENCOUNTER — Ambulatory Visit: Payer: BC Managed Care – PPO | Admitting: Gastroenterology

## 2013-12-21 ENCOUNTER — Other Ambulatory Visit: Payer: Self-pay | Admitting: Physician Assistant

## 2014-03-30 ENCOUNTER — Other Ambulatory Visit: Payer: Self-pay | Admitting: Family Medicine

## 2014-05-17 ENCOUNTER — Encounter: Payer: Self-pay | Admitting: Gastroenterology

## 2014-11-14 ENCOUNTER — Ambulatory Visit (INDEPENDENT_AMBULATORY_CARE_PROVIDER_SITE_OTHER): Payer: BLUE CROSS/BLUE SHIELD | Admitting: Adult Health

## 2014-11-14 ENCOUNTER — Encounter: Payer: Self-pay | Admitting: Adult Health

## 2014-11-14 VITALS — BP 106/78 | HR 52 | Ht 64.5 in | Wt 131.5 lb

## 2014-11-14 DIAGNOSIS — R232 Flushing: Secondary | ICD-10-CM

## 2014-11-14 DIAGNOSIS — N951 Menopausal and female climacteric states: Secondary | ICD-10-CM | POA: Insufficient documentation

## 2014-11-14 DIAGNOSIS — R61 Generalized hyperhidrosis: Secondary | ICD-10-CM | POA: Diagnosis not present

## 2014-11-14 DIAGNOSIS — R450 Nervousness: Secondary | ICD-10-CM | POA: Diagnosis not present

## 2014-11-14 DIAGNOSIS — IMO0001 Reserved for inherently not codable concepts without codable children: Secondary | ICD-10-CM | POA: Insufficient documentation

## 2014-11-14 DIAGNOSIS — Z79890 Hormone replacement therapy (postmenopausal): Secondary | ICD-10-CM

## 2014-11-14 DIAGNOSIS — Z780 Asymptomatic menopausal state: Secondary | ICD-10-CM

## 2014-11-14 HISTORY — DX: Flushing: R23.2

## 2014-11-14 HISTORY — DX: Hormone replacement therapy: Z79.890

## 2014-11-14 HISTORY — DX: Generalized hyperhidrosis: R61

## 2014-11-14 HISTORY — DX: Reserved for inherently not codable concepts without codable children: IMO0001

## 2014-11-14 HISTORY — DX: Asymptomatic menopausal state: Z78.0

## 2014-11-14 MED ORDER — CONJ ESTROGENS-BAZEDOXIFENE 0.45-20 MG PO TABS: ORAL_TABLET | ORAL | Status: DC

## 2014-11-14 NOTE — Patient Instructions (Signed)
Conjugated Estrogens; Bazedoxifene tablets What is this medicine? CONJUGATED ESTROGENS; BAZEDOXIFENE (CON ju gate ed ESS troe jenz; BAY ze DOX i feen) is used as hormone replacement in menopausal women who still have their uterus. This medicine helps to treat hot flashes and prevent osteoporosis (weak bones). This medicine may be used for other purposes; ask your health care provider or pharmacist if you have questions. What should I tell my health care provider before I take this medicine? They need to know if you have any of these conditions: -abnormal vaginal bleeding -blood vessel disease or blood clots -breast, cervical, endometrial, ovarian, liver, or uterine cancer -dementia -diabetes -endometriosis -fibroids -gallbladder disease -heart disease or recent heart attack -hereditary angioedema -high blood pressure -high cholesterol -high level of calcium in the blood -kidney disease -liver disease -mental depression -migraine headaches -porphyria -protein C deficiency -protein S deficiency -seizure disorder -stroke -systemic lupus erythematosus -thyroid disorder -tobacco smoker -an unusual or allergic reaction to estrogens, bazedoxifene, other medicines, foods, dyes, or preservatives -pregnant or trying to get pregnant -breast-feeding How should I use this medicine? Take this medicine by mouth with a glass of water. Follow the directions on the prescription label. Take your medicine at regular intervals, at the same time each day. Do not take your medicine more often than directed. A patient package insert for the product will be given with each prescription and refill. Read this sheet carefully each time. Talk to your pediatrician regarding the use of this medicine in children. This medicine is not approved for use in children. Overdosage: If you think you have taken too much of this medicine contact a poison control center or emergency room at once. NOTE: This medicine is  only for you. Do not share this medicine with others. What if I miss a dose? If you miss a dose, take it as soon as you can. If it is almost time for your next dose, take only that dose. Do not take double or extra doses. What may interact with this medicine? -aromatase inhibitors like aminoglutethimide, anastrozole, exemestane, letrozole, testolactone -metyrapone This medicine may also interact with the following medications: -barbiturates, such as phenobarbital -carbamazepine -clarithromycin -erythromycin -grapefruit juice -medicines for fungal infections like ketoconazole and itraconazole -phenytoin -rifampin -ritonavir -St. John's Wort -thyroid hormones This list may not describe all possible interactions. Give your health care provider a list of all the medicines, herbs, non-prescription drugs, or dietary supplements you use. Also tell them if you smoke, drink alcohol, or use illegal drugs. Some items may interact with your medicine. What should I watch for while using this medicine? Do not take a progestin product or additional estrogen or estrogen-like products while taking this drug. Discuss the risks and benefits of taking this drug with your prescriber. Visit your health care professional for regular checks on your progress. You will need a regular breast and pelvic exam and Pap smear while on this medicine. You should also discuss the need for regular mammograms with your health care professional, and follow his or her guidelines for these tests. This medicine can make your body retain fluid, making your fingers, hands, or ankles swell. Your blood pressure can go up. Contact your doctor or health care professional if you feel you are retaining fluid. You should make sure you get enough calcium and vitamin D in your diet while you are taking this medicine. Discuss your dietary needs with your health care professional or nutritionist. Exercise may help to prevent bone loss. Discuss  your exercise  needs with your doctor or health care professional. This medicine can rarely cause blood clots. You should avoid long periods of bed rest while taking this medicine. If you are going to have surgery, tell your doctor or health care professional that you are taking this medicine. This medicine should be stopped at least 3 days before surgery. After surgery, it should be restarted only after you are walking again. It should not be restarted while you still need long periods of bed rest. Smoking increases the risk of getting a blood clot or having a stroke while you are taking this medicine, especially if you are more than 52 years old. You are strongly advised not to smoke. If you have any reason to think you are pregnant; stop taking this medicine at once and contact your doctor or health care professional. If you wear contact lenses and notice visual changes, or if the lenses begin to feel uncomfortable, consult your eye care specialist. What side effects may I notice from receiving this medicine? Side effects that you should report to your doctor or health care professional as soon as possible: -allergic reactions like skin rash, itching or hives, swelling of the face, lips, or tongue -breast tissue changes or discharge -changes in vision -chest pain -confusion, trouble speaking or understanding -dark urine -general ill feeling or flu-like symptoms -light-colored stools -nausea, vomiting -pain, swelling, warmth in the leg -right upper belly pain -severe headaches -shortness of breath -sudden numbness or weakness of the face, arm or leg -trouble walking, dizziness, loss of balance or coordination -unusual vaginal bleeding -yellowing of the eyes or skin Side effects that usually do not require medical attention (Report these to your doctor or health care professional if they continue or are bothersome.): -abdominal pain -diarrhea -dizziness -hair loss -increased hunger or  thirst -increased urination -nausea -symptoms of vaginal infection like itching, irritation or unusual discharge -unusually weak or tired -upset stomach This list may not describe all possible side effects. Call your doctor for medical advice about side effects. You may report side effects to FDA at 1-800-FDA-1088. Where should I keep my medicine? Keep out of the reach of children. Store at room temperature between 15 and 30 degrees C (59 and 86 degrees F). Throw away any unused medicine after the expiration date. NOTE: This sheet is a summary. It may not cover all possible information. If you have questions about this medicine, talk to your doctor, pharmacist, or health care provider.    2016, Elsevier/Gold Standard. (2011-10-12 15:15:52) Menopause Menopause is the normal time of life when menstrual periods stop completely. Menopause is complete when you have missed 12 consecutive menstrual periods. It usually occurs between the ages of 76 years and 74 years. Very rarely does a woman develop menopause before the age of 91 years. At menopause, your ovaries stop producing the female hormones estrogen and progesterone. This can cause undesirable symptoms and also affect your health. Sometimes the symptoms may occur 4-5 years before the menopause begins. There is no relationship between menopause and:  Oral contraceptives.  Number of children you had.  Race.  The age your menstrual periods started (menarche). Heavy smokers and very thin women may develop menopause earlier in life. CAUSES  The ovaries stop producing the female hormones estrogen and progesterone.  Other causes include:  Surgery to remove both ovaries.  The ovaries stop functioning for no known reason.  Tumors of the pituitary gland in the brain.  Medical disease that affects the ovaries and hormone  production.  Radiation treatment to the abdomen or pelvis.  Chemotherapy that affects the ovaries. SYMPTOMS   Hot  flashes.  Night sweats.  Decrease in sex drive.  Vaginal dryness and thinning of the vagina causing painful intercourse.  Dryness of the skin and developing wrinkles.  Headaches.  Tiredness.  Irritability.  Memory problems.  Weight gain.  Bladder infections.  Hair growth of the face and chest.  Infertility. More serious symptoms include:  Loss of bone (osteoporosis) causing breaks (fractures).  Depression.  Hardening and narrowing of the arteries (atherosclerosis) causing heart attacks and strokes. DIAGNOSIS   When the menstrual periods have stopped for 12 straight months.  Physical exam.  Hormone studies of the blood. TREATMENT  There are many treatment choices and nearly as many questions about them. The decisions to treat or not to treat menopausal changes is an individual choice made with your health care provider. Your health care provider can discuss the treatments with you. Together, you can decide which treatment will work best for you. Your treatment choices may include:   Hormone therapy (estrogen and progesterone).  Non-hormonal medicines.  Treating the individual symptoms with medicine (for example antidepressants for depression).  Herbal medicines that may help specific symptoms.  Counseling by a psychiatrist or psychologist.  Group therapy.  Lifestyle changes including:  Eating healthy.  Regular exercise.  Limiting caffeine and alcohol.  Stress management and meditation.  No treatment. HOME CARE INSTRUCTIONS   Take the medicine your health care provider gives you as directed.  Get plenty of sleep and rest.  Exercise regularly.  Eat a diet that contains calcium (good for the bones) and soy products (acts like estrogen hormone).  Avoid alcoholic beverages.  Do not smoke.  If you have hot flashes, dress in layers.  Take supplements, calcium, and vitamin D to strengthen bones.  You can use over-the-counter lubricants or  moisturizers for vaginal dryness.  Group therapy is sometimes very helpful.  Acupuncture may be helpful in some cases. SEEK MEDICAL CARE IF:   You are not sure you are in menopause.  You are having menopausal symptoms and need advice and treatment.  You are still having menstrual periods after age 92 years.  You have pain with intercourse.  Menopause is complete (no menstrual period for 12 months) and you develop vaginal bleeding.  You need a referral to a specialist (gynecologist, psychiatrist, or psychologist) for treatment. SEEK IMMEDIATE MEDICAL CARE IF:   You have severe depression.  You have excessive vaginal bleeding.  You fell and think you have a broken bone.  You have pain when you urinate.  You develop leg or chest pain.  You have a fast pounding heart beat (palpitations).  You have severe headaches.  You develop vision problems.  You feel a lump in your breast.  You have abdominal pain or severe indigestion.   This information is not intended to replace advice given to you by your health care provider. Make sure you discuss any questions you have with your health care provider.   Document Released: 03/14/2003 Document Revised: 08/24/2012 Document Reviewed: 07/21/2012 Elsevier Interactive Patient Education 2016 [place] 1 duavee daily Follow up with me about 12/5

## 2014-11-14 NOTE — Progress Notes (Signed)
Subjective:     Patient ID: Catherine Short, female   DOB: [birth year], 54 y.o.   MRN: [id number]  HPI Vianka is a 54 year old white female,married,new to this practice,in complaining of night sweats,hot flashes and bad nerves,also not sleeping well.She had been on aygestin for last 6 years to stop her periods and just stopped it in September.Has constipation too. She is trim and looks fit but says she is having weight gain in mid section and goes not like it and can't concentrate.She has tried OTC meds like black cohosh and yams. PCP is [facility], was going to gyn in [place],but she said they did not listen.  Review of Systems  Patient denies any headaches, hearing loss, fatigue, blurred vision, shortness of breath, chest pain, abdominal pain, problems with  urination, or intercourse. No joint pain, she HPI for positives.  Reviewed past medical,surgical, social and family history. Reviewed medications and allergies.     Objective:   Physical Exam BP 106/78 mmHg  Pulse 52  Ht 5' 4.5" (1.638 m)  Wt 131 lb 8 oz (59.648 kg)  BMI 22.23 kg/m2 Skin warm and dry. Neck: mid line trachea, normal thyroid, good ROM, no lymphadenopathy noted. Lungs: clear to ausculation bilaterally. Cardiovascular: regular rate and rhythm.Face time 20 minutes with 50% counseling over menopause and risk and benefits of HRT.Stop OTC meds.will try Duavee, and can use luvena for added vaginal moisture.    Assessment:     Menopause Hot flashes Night sweats  Nerves  HRT    Plan:     Given 28 tabs of Duavee to try,take 1 daily lot [id number] exp 6/17 Follow up in December to see if HRT working Review handout on menopause and duavee

## 2014-11-20 ENCOUNTER — Telehealth: Payer: Self-pay | Admitting: Adult Health

## 2014-11-20 NOTE — Telephone Encounter (Signed)
Spoke with pt. Pt started [facility] on 11/14/14 and was wondering when she will be able to tell a difference. I spoke with JAG and she advised to give it a good 2-4 weeks. Pt voiced understanding. [place]

## 2014-12-10 ENCOUNTER — Ambulatory Visit (INDEPENDENT_AMBULATORY_CARE_PROVIDER_SITE_OTHER): Payer: BLUE CROSS/BLUE SHIELD | Admitting: Adult Health

## 2014-12-10 ENCOUNTER — Encounter: Payer: Self-pay | Admitting: Adult Health

## 2014-12-10 VITALS — BP 110/78 | HR 56 | Ht 64.5 in | Wt 130.5 lb

## 2014-12-10 DIAGNOSIS — Z79890 Hormone replacement therapy (postmenopausal): Secondary | ICD-10-CM | POA: Diagnosis not present

## 2014-12-10 MED ORDER — MONTELUKAST SODIUM 10 MG PO TABS: 10.0000 mg | ORAL_TABLET | Freq: Every day | ORAL | Status: DC

## 2014-12-10 MED ORDER — CONJ ESTROGENS-BAZEDOXIFENE 0.45-20 MG PO TABS: ORAL_TABLET | ORAL | Status: DC

## 2014-12-10 NOTE — Progress Notes (Signed)
Subjective:     Patient ID: Catherine Short, female   DOB: Aug 21, 1960, 54 y.o.   MRN: [id number]  HPI Catherine Short is a 54 year old white female seen for the first time in November and started on [facility] and she has had decreased hot flashes and night sweats, is sleeping better and moods are better,too.  Review of Systems Patient denies any headaches, hearing loss, fatigue, blurred vision, shortness of breath, chest pain, abdominal pain, problems with bowel movements, urination, or intercourse. No joint pain or mood swings.See HPI for positives. Reviewed past medical,surgical, social and family history. Reviewed medications and allergies.     Objective:   Physical Exam BP 110/78 mmHg  Pulse 56  Ht 5' 4.5" (1.638 m)  Wt 130 lb 8 oz (59.194 kg)  BMI 22.06 kg/m2   Face time 10 minutes talking about how much better she is since starting [facility], and she wants to continue but insurance will not cover it yet.She needs refill on Singulair.  Assessment:     HRT    Plan:    Continue Duavee #56 tabs given lot [id number] exp 5/17 Refilled Singulair 10 mg #90 with 3 refills Follow up in 6 months or before if needed

## 2014-12-10 NOTE — Patient Instructions (Signed)
Continue duavee Follow up in 6 months

## 2014-12-20 ENCOUNTER — Encounter: Payer: Self-pay | Admitting: *Deleted

## 2015-01-25 ENCOUNTER — Telehealth: Payer: Self-pay | Admitting: Adult Health

## 2015-01-25 NOTE — Telephone Encounter (Signed)
Spoke with pt. Pt requesting Duavee samples. I spoke with JAG and she advised ok to give samples. 8 boxes given. Lot # [id number] exp 06/2015. Pt to pick up at front desk Monday. [place]

## 2015-02-06 ENCOUNTER — Telehealth: Payer: Self-pay | Admitting: Adult Health

## 2015-02-06 NOTE — Telephone Encounter (Signed)
Spoke with pt. Pt has been on Duavee x 3 months. She was put on Keflex x 10 days. Pt states her breasts and nipples are sore and tender. Some vaginal discharge. I advised the [facility] can cause breast tenderness per JAG. It should get better. Advised to try and give it another month and if she still has symptoms, let us know or sooner if worse. Pt voiced understanding. [place]

## 2015-02-18 ENCOUNTER — Telehealth: Payer: Self-pay | Admitting: *Deleted

## 2015-02-18 MED ORDER — CITALOPRAM HYDROBROMIDE 20 MG PO TABS: 20.0000 mg | ORAL_TABLET | Freq: Every day | ORAL | Status: DC

## 2015-02-18 NOTE — Telephone Encounter (Signed)
Pt is complaining of about 5 lb weight gain and  Feels puffy in am, breast sore, want stop duavee, will try celexa to see if helps hot flashes and moods.

## 2015-02-18 NOTE — Telephone Encounter (Signed)
Spoke with pt. Pt states the [facility] helped with the hot flashes but pt is gaining weight, breasts are sore. She is getting depressed due to weight gain. She hasn't taken the [facility] the last 2 nights. Please advise. Thanks!! [place]

## 2015-05-16 ENCOUNTER — Telehealth: Payer: Self-pay | Admitting: *Deleted

## 2015-05-16 ENCOUNTER — Telehealth: Payer: Self-pay | Admitting: Adult Health

## 2015-05-16 DIAGNOSIS — Z139 Encounter for screening, unspecified: Secondary | ICD-10-CM

## 2015-05-16 NOTE — Telephone Encounter (Signed)
Spoke with pt. Pt has an appt June 12 for physical. She is spotting. Has noticed blood from vagina when she has a BM. I advised she needs to be seen. Pt wants to have physical when she is seen so she only has 1 copay. Pt states she can have 1 physical per year, it doesn't have to be a year and a day from last. Call was transferred to front desk to see if we had something sooner than 6/12. [place]

## 2015-05-16 NOTE — Telephone Encounter (Signed)
Labs ordered.

## 2015-05-23 ENCOUNTER — Other Ambulatory Visit: Payer: Self-pay | Admitting: Adult Health

## 2015-05-23 ENCOUNTER — Encounter: Payer: Self-pay | Admitting: Adult Health

## 2015-05-23 ENCOUNTER — Ambulatory Visit (INDEPENDENT_AMBULATORY_CARE_PROVIDER_SITE_OTHER): Payer: BLUE CROSS/BLUE SHIELD | Admitting: Adult Health

## 2015-05-23 VITALS — BP 106/56 | HR 60 | Ht 63.25 in | Wt 127.5 lb

## 2015-05-23 DIAGNOSIS — N816 Rectocele: Secondary | ICD-10-CM

## 2015-05-23 DIAGNOSIS — R61 Generalized hyperhidrosis: Secondary | ICD-10-CM | POA: Diagnosis not present

## 2015-05-23 DIAGNOSIS — F489 Nonpsychotic mental disorder, unspecified: Secondary | ICD-10-CM

## 2015-05-23 DIAGNOSIS — Z01411 Encounter for gynecological examination (general) (routine) with abnormal findings: Secondary | ICD-10-CM | POA: Diagnosis not present

## 2015-05-23 DIAGNOSIS — Z1211 Encounter for screening for malignant neoplasm of colon: Secondary | ICD-10-CM | POA: Diagnosis not present

## 2015-05-23 DIAGNOSIS — Z01419 Encounter for gynecological examination (general) (routine) without abnormal findings: Secondary | ICD-10-CM

## 2015-05-23 DIAGNOSIS — Z780 Asymptomatic menopausal state: Secondary | ICD-10-CM

## 2015-05-23 DIAGNOSIS — R4589 Other symptoms and signs involving emotional state: Secondary | ICD-10-CM | POA: Insufficient documentation

## 2015-05-23 DIAGNOSIS — Z139 Encounter for screening, unspecified: Secondary | ICD-10-CM | POA: Diagnosis not present

## 2015-05-23 DIAGNOSIS — N950 Postmenopausal bleeding: Secondary | ICD-10-CM | POA: Diagnosis not present

## 2015-05-23 HISTORY — DX: Other symptoms and signs involving emotional state: R45.89

## 2015-05-23 HISTORY — DX: Rectocele: N81.6

## 2015-05-23 HISTORY — DX: Postmenopausal bleeding: N95.0

## 2015-05-23 LAB — HEMOCCULT GUIAC POC 1CARD (OFFICE): FECAL OCCULT BLD: NEGATIVE

## 2015-05-23 MED ORDER — CITALOPRAM HYDROBROMIDE 20 MG PO TABS: 20.0000 mg | ORAL_TABLET | Freq: Every day | ORAL | Status: DC

## 2015-05-23 NOTE — Progress Notes (Signed)
Patient ID: [name], female   DOB: [birth year], 55 y.o.   MRN: [id number] History of Present Illness: Catherine Short is a 55 year old white female, married in for a well woman gyn exam, she had a normal pap 08/29/14.She is complaining of vaginal spotting, esp after BM and pain with sex at times.She is also having night sweats and is moody but the celexa has helped with hot flashes. She had tried [facility] in past and liked it but then started with breast tenderness and so we switched to celexa.  She has constipation issues too.She had fasting labs this am. Husband with her today.  Current Medications, Allergies, Past Medical History, Past Surgical History, Family History and Social History were reviewed in [employer] record.     Review of Systems: Patient denies any headaches, hearing loss, fatigue, blurred vision, shortness of breath, chest pain, abdominal pain, problems with urination. No joint pain. See HPI for positives.   Physical Exam:BP 106/56 mmHg  Pulse 60  Ht 5' 3.25" (1.607 m)  Wt 127 lb 8 oz (57.834 kg)  BMI 22.40 kg/m2 General:  Well developed, well nourished, no acute distress Skin:  Warm and dry,tan Neck:  Midline trachea, normal thyroid, good ROM, no lymphadenopathy Lungs; Clear to auscultation bilaterally Breast:  No dominant palpable mass, retraction, or nipple discharge, has bilateral implants Cardiovascular: Regular rate and rhythm Abdomen:  Soft, non tender, no hepatosplenomegaly Pelvic:  External genitalia is normal in appearance, no lesions.  The vagina is normal in appearance. Urethra has no lesions or masses. The cervix is smooth, no CMT.  Uterus is felt to be normal size, shape, and contour.  No adnexal masses or tenderness noted.Bladder is non tender, no masses felt. Rectal: Good sphincter tone, no polyps, or hemorrhoids felt.  Hemoccult negative.+ rectocele Extremities/musculoskeletal:  No swelling or varicosities noted, no clubbing or  cyanosis Psych:  No mood changes, alert and cooperative,seems happy Discussed could try vaginal estrogen, but will get [place] first to assess uterus and endometrial lining.  Impression:  Well woman gyn exam, no pap PMB Menopause Night sweats [place]  Rectocele    Plan: Refilled celexa 20 mg #30 take 1 daily with 3 refills Return in 6 days for GYN [place] Physical in 1 year,pap 2019 Get mammogram yearly Review handout on menopause

## 2015-05-23 NOTE — Patient Instructions (Signed)
Postmenopausal Bleeding Postmenopausal bleeding is any bleeding a woman has after she has entered into menopause. Menopause is the end of a woman's fertile years. After menopause, a woman no longer ovulates or has menstrual periods.  Postmenopausal bleeding can be caused by various things. Any type of postmenopausal bleeding, even if it appears to be a typical menstrual period, is concerning. This should be evaluated by your health care provider. Any treatment will depend on the cause of the bleeding. HOME CARE INSTRUCTIONS Monitor your condition for any changes. The following actions may help to alleviate any discomfort you are experiencing:  Avoid the use of tampons and douches as directed by your health care provider.  Change your pads frequently.  Get regular pelvic exams and Pap tests.  Keep all follow-up appointments for diagnostic tests as directed by your health care provider. SEEK MEDICAL CARE IF:   Your bleeding lasts more than 1 week.  You have abdominal pain.  You have bleeding with sexual intercourse. SEEK IMMEDIATE MEDICAL CARE IF:   You have a fever, chills, headache, dizziness, muscle aches, and bleeding.  You have severe pain with bleeding.  You are passing blood clots.  You have bleeding and need more than 1 pad an hour.  You feel faint. MAKE SURE YOU:  Understand these instructions.  Will watch your condition.  Will get help right away if you are not doing well or get worse.   This information is not intended to replace advice given to you by your health care provider. Make sure you discuss any questions you have with your health care provider.   Document Released: 04/01/2005 Document Revised: 10/12/2012 Document Reviewed: 07/21/2012 Elsevier Interactive Patient Education [employer]. Return in 1 week for [place] Physical in 1 year Mammogram yearly

## 2015-05-24 LAB — LIPID PANEL
CHOLESTEROL TOTAL: 202 mg/dL — AB (ref 100–199)
Chol/HDL Ratio: 1.9 ratio units (ref 0.0–4.4)
HDL: 107 mg/dL (ref >39)
LDL CALC: 86 mg/dL (ref 0–99)
Triglycerides: 43 mg/dL (ref 0–149)
VLDL CHOLESTEROL CAL: 9 mg/dL (ref 5–40)

## 2015-05-24 LAB — COMPREHENSIVE METABOLIC PANEL
ALBUMIN: 4.4 g/dL (ref 3.5–5.5)
ALT: 23 IU/L (ref 0–32)
AST: 23 IU/L (ref 0–40)
Albumin/Globulin Ratio: 2.6 — ABNORMAL HIGH (ref 1.2–2.2)
Alkaline Phosphatase: 37 IU/L — ABNORMAL LOW (ref 39–117)
BUN / CREAT RATIO: 19 (ref 9–23)
BUN: 17 mg/dL (ref 6–24)
Bilirubin Total: 0.9 mg/dL (ref 0.0–1.2)
CO2: 25 mmol/L (ref 18–29)
CREATININE: 0.88 mg/dL (ref 0.57–1.00)
Calcium: 9.3 mg/dL (ref 8.7–10.2)
Chloride: 100 mmol/L (ref 96–106)
GFR calc non Af Amer: 76 mL/min/{1.73_m2} (ref >59)
GFR, EST AFRICAN AMERICAN: 87 mL/min/{1.73_m2} (ref >59)
GLUCOSE: 90 mg/dL (ref 65–99)
Globulin, Total: 1.7 g/dL (ref 1.5–4.5)
Potassium: 4.9 mmol/L (ref 3.5–5.2)
Sodium: 141 mmol/L (ref 134–144)
TOTAL PROTEIN: 6.1 g/dL (ref 6.0–8.5)

## 2015-05-24 LAB — CBC
HEMOGLOBIN: 14.8 g/dL (ref 11.1–15.9)
Hematocrit: 43.2 % (ref 34.0–46.6)
MCH: 33.2 pg — ABNORMAL HIGH (ref 26.6–33.0)
MCHC: 34.3 g/dL (ref 31.5–35.7)
MCV: 97 fL (ref 79–97)
PLATELETS: 195 10*3/uL (ref 150–379)
RBC: 4.46 x10E6/uL (ref 3.77–5.28)
RDW: 13.9 % (ref 12.3–15.4)
WBC: 6.1 10*3/uL (ref 3.4–10.8)

## 2015-05-24 LAB — TSH: TSH: 1.17 u[IU]/mL (ref 0.450–4.500)

## 2015-05-24 LAB — HEMOGLOBIN A1C
Est. average glucose Bld gHb Est-mCnc: 108 mg/dL
HEMOGLOBIN A1C: 5.4 % (ref 4.8–5.6)

## 2015-05-24 LAB — VITAMIN D 25 HYDROXY (VIT D DEFICIENCY, FRACTURES): Vit D, 25-Hydroxy: 83.9 ng/mL (ref 30.0–100.0)

## 2015-05-27 ENCOUNTER — Telehealth: Payer: Self-pay | Admitting: Adult Health

## 2015-05-27 NOTE — Telephone Encounter (Signed)
Labs not in epic but got hard copy from [facility] and they are excellent, pt aware will have them scanned in

## 2015-05-29 ENCOUNTER — Ambulatory Visit (INDEPENDENT_AMBULATORY_CARE_PROVIDER_SITE_OTHER): Payer: BLUE CROSS/BLUE SHIELD

## 2015-05-29 DIAGNOSIS — N950 Postmenopausal bleeding: Secondary | ICD-10-CM

## 2015-05-29 DIAGNOSIS — R938 Abnormal findings on diagnostic imaging of other specified body structures: Secondary | ICD-10-CM

## 2015-05-29 DIAGNOSIS — N854 Malposition of uterus: Secondary | ICD-10-CM

## 2015-05-29 DIAGNOSIS — N888 Other specified noninflammatory disorders of cervix uteri: Secondary | ICD-10-CM

## 2015-05-29 NOTE — Progress Notes (Signed)
PELVIC US TA/TV:homogenous anteverted uterus,thickened EEC 8.4 mm,normal ov's bilat,small amount of simple rt adnexal fluid, small simple nabothian cyst

## 2015-05-30 ENCOUNTER — Encounter: Payer: Self-pay | Admitting: Adult Health

## 2015-05-30 ENCOUNTER — Telehealth: Payer: Self-pay | Admitting: Adult Health

## 2015-05-30 DIAGNOSIS — R9389 Abnormal findings on diagnostic imaging of other specified body structures: Secondary | ICD-10-CM

## 2015-05-30 HISTORY — DX: Abnormal findings on diagnostic imaging of other specified body structures: R93.89

## 2015-05-30 NOTE — Telephone Encounter (Signed)
Pt aware US showed endometrial thickness 8.4 mm, will schedule endometrial biopsy for next week

## 2015-06-10 ENCOUNTER — Other Ambulatory Visit: Payer: Self-pay | Admitting: Obstetrics & Gynecology

## 2015-06-10 ENCOUNTER — Encounter: Payer: Self-pay | Admitting: Obstetrics & Gynecology

## 2015-06-10 ENCOUNTER — Ambulatory Visit (INDEPENDENT_AMBULATORY_CARE_PROVIDER_SITE_OTHER): Payer: BLUE CROSS/BLUE SHIELD | Admitting: Obstetrics & Gynecology

## 2015-06-10 VITALS — BP 108/60 | HR 62 | Ht 63.5 in | Wt 127.0 lb

## 2015-06-10 DIAGNOSIS — R938 Abnormal findings on diagnostic imaging of other specified body structures: Secondary | ICD-10-CM | POA: Diagnosis not present

## 2015-06-10 DIAGNOSIS — N950 Postmenopausal bleeding: Secondary | ICD-10-CM

## 2015-06-10 DIAGNOSIS — R9389 Abnormal findings on diagnostic imaging of other specified body structures: Secondary | ICD-10-CM

## 2015-06-10 NOTE — Addendum Note (Signed)
Addended by: Doyne Keel on: 06/10/2015 05:42 PM   Modules accepted: Orders

## 2015-06-10 NOTE — Progress Notes (Signed)
Patient ID: Catherine Short, female   DOB: [birth year], 55 y.o.   MRN: [id number] Endometrial Biopsy Procedure Note  Pre-operative Diagnosis: thickened endometrial stripe with post menopausal bleeding  Post-operative Diagnosis: same  Indications: postmenopausal bleeding  Procedure Details   Urine pregnancy test was not done.  The risks (including infection, bleeding, pain, and uterine perforation) and benefits of the procedure were explained to the patient and Written informed consent was obtained.  Antibiotic prophylaxis against endocarditis was not indicated.   The patient was placed in the dorsal lithotomy position.  Bimanual exam showed the uterus to be in the neutral position.  A Graves' speculum inserted in the vagina, and the cervix prepped with povidone iodine.  Endocervical curettage with a Kevorkian curette was not performed.   A sharp tenaculum was applied to the anterior lip of the cervix for stabilization.  A sterile uterine sound was used to sound the uterus to a depth of 5.5cm.  A Pipelle endometrial aspirator was used to sample the endometrium.  Sample was sent for pathologic examination.  Condition: Stable  Complications: None  Plan:  The patient was advised to call for any fever or for prolonged or severe pain or bleeding. She was advised to use OTC ibuprofen as needed for mild to moderate pain. She was advised to avoid vaginal intercourse for 48 hours or until the bleeding has completely stopped.  Attending Physician Documentation: I was present for or performed the following: endometrial biopsy

## 2015-06-18 ENCOUNTER — Encounter: Payer: Self-pay | Admitting: Obstetrics & Gynecology

## 2015-06-18 ENCOUNTER — Ambulatory Visit (INDEPENDENT_AMBULATORY_CARE_PROVIDER_SITE_OTHER): Payer: BLUE CROSS/BLUE SHIELD | Admitting: Obstetrics & Gynecology

## 2015-06-18 VITALS — BP 90/60 | HR 60 | Wt 127.0 lb

## 2015-06-18 DIAGNOSIS — N950 Postmenopausal bleeding: Secondary | ICD-10-CM | POA: Diagnosis not present

## 2015-06-18 DIAGNOSIS — R61 Generalized hyperhidrosis: Secondary | ICD-10-CM | POA: Diagnosis not present

## 2015-06-18 MED ORDER — ESTRADIOL 1 MG/GM TD GEL: 1.0000 | Freq: Every day | TRANSDERMAL | Status: DC

## 2015-06-18 MED ORDER — PROGESTERONE MICRONIZED 200 MG PO CAPS: ORAL_CAPSULE | ORAL | Status: DC

## 2015-06-18 NOTE — Progress Notes (Signed)
Patient ID: Catherine Short, female   DOB: 1960-08-01, 55 y.o.   MRN: [id number] Follow up appointment for results  Chief Complaint  Patient presents with  . Follow-up    Blood pressure 90/60, pulse 60, weight 127 lb (57.607 kg).  Pt had endometrial biopsy which was negative pathology  Is having a lot of menopausal symptoms Interested in trying different option  Meds ordered this encounter  Medications  . progesterone (PROMETRIUM) 200 MG capsule    Sig: Take 1 nightly    Dispense:  30 capsule    Refill:  11  . Estradiol 1 MG/GM GEL    Sig: Place 1 packet onto the skin daily.    Dispense:  30 g    Refill:  11     MEDS ordered this encounter: Meds ordered this encounter  Medications  . progesterone (PROMETRIUM) 200 MG capsule    Sig: Take 1 nightly    Dispense:  30 capsule    Refill:  11  . Estradiol 1 MG/GM GEL    Sig: Place 1 packet onto the skin daily.    Dispense:  30 g    Refill:  11    Orders for this encounter: No orders of the defined types were placed in this encounter.    Plan:  Follow Up: Return in about 3 months (around 09/18/2015) for Follow up, with Dr Elonda Husky.     Face to face time:  15 minutes  Greater than 50% of the visit time was spent in counseling and coordination of care with the patient.  The summary and outline of the counseling and care coordination is summarized in the note above.   All questions were answered.  Past Medical History  Diagnosis Date  . Colitis   . Prolactinoma ([place])   . Menopause 11/14/2014  . Hot flashes 11/14/2014  . Night sweats 11/14/2014  . Nerves 11/14/2014  . Hormone replacement therapy (HRT) 11/14/2014  . Insomnia   . Anxiety   . Chronic constipation   . Moody 05/23/2015  . PMB (postmenopausal bleeding) 05/23/2015  . Rectocele 05/23/2015  . Increased endometrial stripe thickness 05/30/2015    Will get biopsy    Past Surgical History  Procedure Laterality Date  . Carpal tunnel release    . Tonsilectomy,  adenoidectomy, bilateral myringotomy and tubes    . Breast enhancement surgery  1996    OB History    Gravida Para Term Preterm AB TAB SAB Ectopic Multiple Living   2 1   1  1   1       Allergies  Allergen Reactions  . Cyclobenzaprine Hcl     REACTION: disoriented-Flexeril    Social History   Social History  . Marital Status: Married    Spouse Name: N/A  . Number of Children: 1  . Years of Education: N/A   Occupational History  . [employer]   Social History Main Topics  . Smoking status: Former Smoker -- 6 years    Types: Cigarettes  . Smokeless tobacco: Never Used  . Alcohol Use: Yes     Comment: twice a week  . Drug Use: No  . Sexual Activity: Yes    Birth Control/ Protection: None   Other Topics Concern  . None   Social History Narrative    Family History  Problem Relation Age of Onset  . Other Mother     digestive type issues  . Diabetes Mother   .  Hypertension Mother   . Heart attack Father   . Heart disease Father   . Other Brother     MVA  . Cancer Maternal Grandmother     uterine  . Emphysema Maternal Grandfather   . Alcohol abuse Brother   . Cancer Paternal Grandfather     colon

## 2015-06-19 ENCOUNTER — Other Ambulatory Visit: Payer: BLUE CROSS/BLUE SHIELD | Admitting: Obstetrics & Gynecology

## 2015-06-19 ENCOUNTER — Telehealth: Payer: Self-pay | Admitting: Obstetrics & Gynecology

## 2015-06-20 ENCOUNTER — Telehealth: Payer: Self-pay | Admitting: Obstetrics & Gynecology

## 2015-06-20 ENCOUNTER — Other Ambulatory Visit: Payer: BLUE CROSS/BLUE SHIELD | Admitting: Adult Health

## 2015-06-20 MED ORDER — ESTRADIOL 0.52 MG/0.87 GM (0.06%) TD GEL: 1.0000 "application " | Freq: Every day | TRANSDERMAL | Status: DC

## 2015-06-20 NOTE — Telephone Encounter (Signed)
Pt informed Elestrin prescription e-scribed.

## 2015-06-24 ENCOUNTER — Telehealth: Payer: Self-pay | Admitting: *Deleted

## 2015-06-24 NOTE — Telephone Encounter (Signed)
Pt wants to know if she can do without the medication due to the cost of it.  Please advise.

## 2015-06-25 DIAGNOSIS — M25562 Pain in left knee: Secondary | ICD-10-CM | POA: Diagnosis not present

## 2015-06-26 ENCOUNTER — Telehealth: Payer: Self-pay | Admitting: Obstetrics & Gynecology

## 2015-06-26 MED ORDER — ESTRADIOL 2 MG PO TABS: 2.0000 mg | ORAL_TABLET | Freq: Every day | ORAL | Status: DC

## 2015-06-27 NOTE — Telephone Encounter (Signed)
Nope since she decided she wanted to try estrogen for her menopausal symptoms, she is to take the prometrium the first 10 days of each month

## 2015-06-27 NOTE — Telephone Encounter (Signed)
Pt informed she is to take the prometrium the first 10 days of each month. Pt verbalized understanding.

## 2015-07-02 ENCOUNTER — Telehealth: Payer: Self-pay | Admitting: *Deleted

## 2015-07-02 ENCOUNTER — Telehealth: Payer: Self-pay | Admitting: Obstetrics & Gynecology

## 2015-07-02 MED ORDER — SULFAMETHOXAZOLE-TRIMETHOPRIM 800-160 MG PO TABS: 1.0000 | ORAL_TABLET | Freq: Two times a day (BID) | ORAL | Status: DC

## 2015-07-02 NOTE — Telephone Encounter (Signed)
Pt states started Estradiol and now c/o burning with urination, urine dark. History of frequent UTI's.

## 2015-07-22 DIAGNOSIS — Z1283 Encounter for screening for malignant neoplasm of skin: Secondary | ICD-10-CM | POA: Diagnosis not present

## 2015-07-22 DIAGNOSIS — I781 Nevus, non-neoplastic: Secondary | ICD-10-CM | POA: Diagnosis not present

## 2015-07-22 DIAGNOSIS — L723 Sebaceous cyst: Secondary | ICD-10-CM | POA: Diagnosis not present

## 2015-09-18 ENCOUNTER — Ambulatory Visit: Payer: BLUE CROSS/BLUE SHIELD | Admitting: Obstetrics & Gynecology

## 2015-10-07 ENCOUNTER — Encounter: Payer: Self-pay | Admitting: Adult Health

## 2015-10-07 ENCOUNTER — Telehealth: Payer: Self-pay | Admitting: Obstetrics & Gynecology

## 2015-10-07 ENCOUNTER — Ambulatory Visit: Payer: BLUE CROSS/BLUE SHIELD | Admitting: Obstetrics & Gynecology

## 2015-10-07 DIAGNOSIS — Z1231 Encounter for screening mammogram for malignant neoplasm of breast: Secondary | ICD-10-CM | POA: Diagnosis not present

## 2015-10-17 ENCOUNTER — Encounter: Payer: Self-pay | Admitting: Obstetrics & Gynecology

## 2015-10-17 ENCOUNTER — Ambulatory Visit (INDEPENDENT_AMBULATORY_CARE_PROVIDER_SITE_OTHER): Payer: BLUE CROSS/BLUE SHIELD | Admitting: Obstetrics & Gynecology

## 2015-10-17 VITALS — BP 100/70 | HR 56 | Ht 63.0 in | Wt 132.0 lb

## 2015-10-17 DIAGNOSIS — Z780 Asymptomatic menopausal state: Secondary | ICD-10-CM

## 2015-10-17 DIAGNOSIS — N950 Postmenopausal bleeding: Secondary | ICD-10-CM | POA: Diagnosis not present

## 2015-10-17 MED ORDER — CITALOPRAM HYDROBROMIDE 40 MG PO TABS: 40.0000 mg | ORAL_TABLET | Freq: Every day | ORAL | 11 refills | Status: DC

## 2015-10-17 MED ORDER — TRIAMTERENE-HCTZ 37.5-25 MG PO TABS: 1.0000 | ORAL_TABLET | Freq: Every day | ORAL | Status: DC

## 2015-10-17 MED ORDER — CITALOPRAM HYDROBROMIDE 20 MG PO TABS: 20.0000 mg | ORAL_TABLET | Freq: Every day | ORAL | 3 refills | Status: DC

## 2015-10-17 MED ORDER — ESTRADIOL 0.1 MG/24HR TD PTTW: 1.0000 | MEDICATED_PATCH | TRANSDERMAL | 12 refills | Status: DC

## 2015-10-17 MED ORDER — TRIAMTERENE-HCTZ 50-25 MG PO CAPS: ORAL_CAPSULE | ORAL | 11 refills | Status: DC

## 2015-10-17 NOTE — Progress Notes (Signed)
Chief Complaint  Patient presents with  . Follow-up    pt c/o vaginal bleeding x 16 days with cramps    Blood pressure 100/70, pulse (!) 56, height 5\' 3"  (1.6 m), weight 132 lb (59.9 kg).  55 y.o. [id number] No LMP recorded. Patient is not currently having periods (Reason: Other). The current method of family planning is post menopausal status.  Outpatient Encounter Prescriptions as of 10/17/2015  Medication Sig Note  . BIOTIN PO Take 10,000 mg by mouth daily.   Chong Sicilian Oil-GLA-Linoleic Acid (BORAGE PO) Take 1,300 mg by mouth daily.   . Calcium-Magnesium-Vitamin D (CALCIUM MAGNESIUM PO) Take by mouth daily.   . Cholecalciferol (VITAMIN D-3 PO) Take by mouth daily.   . citalopram (CELEXA) 40 MG tablet Take 1 tablet (40 mg total) by mouth daily.   . COLLAGEN PO Take by mouth daily.   . diclofenac (CATAFLAM) 50 MG tablet Take 50 mg by mouth as needed.   . fluticasone (FLONASE) 50 MCG/ACT nasal spray Place 2 sprays into both nostrils daily.   [name] Kitchen GLUTATHIONE PO Take 500 mg by mouth daily.   . montelukast (SINGULAIR) 10 MG tablet Take 1 tablet (10 mg total) by mouth at bedtime.   . Naproxen Sodium (ALEVE PO) Take by mouth daily as needed.    . NON FORMULARY Swiss Kriss-every night   . Omega-3 Fatty Acids (FISH OIL PO) Take by mouth. DHA and EFA-daily   . progesterone (PROMETRIUM) 200 MG capsule Take 1 nightly   . vitamin E 1000 UNIT capsule Take 1,000 Units by mouth daily.   [name] Kitchen zolpidem (AMBIEN) 10 MG tablet Take 10 mg by mouth at bedtime as needed for sleep.   . [DISCONTINUED] citalopram (CELEXA) 20 MG tablet Take 1 tablet (20 mg total) by mouth daily.   . [DISCONTINUED] citalopram (CELEXA) 20 MG tablet Take 1 tablet (20 mg total) by mouth daily.   . [DISCONTINUED] estradiol (ESTRACE) 2 MG tablet Take 1 tablet (2 mg total) by mouth daily. 10/17/2015: going to try vivelle dot patches 0.1  . estradiol (VIVELLE-DOT) 0.1 MG/24HR patch Place 1 patch (0.1 mg total) onto the skin 2 (two)  times a week.   . Estradiol 0.52 MG/0.87 GM (0.06%) GEL Apply 1 application topically daily. (Patient not taking: Reported on 10/17/2015)   . sulfamethoxazole-trimethoprim (BACTRIM DS) 800-160 MG tablet Take 1 tablet by mouth 2 (two) times daily. (Patient not taking: Reported on 10/17/2015)   . triamterene-hydrochlorothiazide (DYAZIDE) 50-25 MG capsule 1 tablet daily   . [DISCONTINUED] triamterene-hydrochlorothiazide (MAXZIDE-25) 37.5-25 MG per tablet 1 tablet     No facility-administered encounter medications on file as of 10/17/2015.     Subjective Pt was seen previously with menopausal symptoms and post menopausal bleeding with benign endometrial biopsy She had been having excessive spotting/bleeding on the oral estrogen and prometrium cyclically and also having continued vasomotor and emotional symptoms as well Overall the HRT has not been a positive experience thus far  Objective   Pertinent ROS No burning with urination, frequency or urgency No nausea, vomiting or diarrhea Nor fever chills or other constitutional symptoms   Labs or studies reviewed    Impression Diagnoses this Encounter::   ICD-9-CM ICD-10-CM   1. Post-menopausal bleeding 627.1 N95.0   2. Menopause 627.2 Z78.0     Established relevant diagnosis(es):   Plan/Recommendations: Meds ordered this encounter  Medications  . DISCONTD: citalopram (CELEXA) 20 MG tablet    Sig: Take 1 tablet (20 mg  total) by mouth daily.    Dispense:  30 tablet    Refill:  3  . estradiol (VIVELLE-DOT) 0.1 MG/24HR patch    Sig: Place 1 patch (0.1 mg total) onto the skin 2 (two) times a week.    Dispense:  8 patch    Refill:  12  . DISCONTD: triamterene-hydrochlorothiazide (MAXZIDE-25) 37.5-25 MG per tablet 1 tablet  . triamterene-hydrochlorothiazide (DYAZIDE) 50-25 MG capsule    Sig: 1 tablet daily    Dispense:  30 capsule    Refill:  11  . citalopram (CELEXA) 40 MG tablet    Sig: Take 1 tablet (40 mg total) by mouth  daily.    Dispense:  30 tablet    Refill:  11    Labs or Scans Ordered: No orders of the defined types were placed in this encounter.   Management:: Stop oral estrogen, will see if insurance will cover her vivelle dot patches which should be better than the oral Also will stop her prometrium/progesterone for now and see if that is contributing to her emotional issues and bleeding Will follow up in 2-3 months and restart some cyclical progesterone for endometrial protection  Follow up Return in about 3 months (around 01/17/2016) for Follow up, with Dr Elonda Husky.        Face to face time:  15 minutes  Greater than 50% of the visit time was spent in counseling and coordination of care with the patient.  The summary and outline of the counseling and care coordination is summarized in the note above.   All questions were answered.  Past Medical History:  Diagnosis Date  . Anxiety   . Chronic constipation   . Colitis   . Hormone replacement therapy (HRT) 11/14/2014  . Hot flashes 11/14/2014  . Increased endometrial stripe thickness 05/30/2015   Will get biopsy  . Insomnia   . Menopause 11/14/2014  . Moody 05/23/2015  . Nerves 11/14/2014  . Night sweats 11/14/2014  . PMB (postmenopausal bleeding) 05/23/2015  . Prolactinoma ([place])   . Rectocele 05/23/2015    Past Surgical History:  Procedure Laterality Date  . BREAST ENHANCEMENT SURGERY  1996  . CARPAL TUNNEL RELEASE    . TONSILECTOMY, ADENOIDECTOMY, BILATERAL MYRINGOTOMY AND TUBES      OB History    Gravida Para Term Preterm AB Living   2 1     1 1    SAB TAB Ectopic Multiple Live Births   1              Allergies  Allergen Reactions  . Cyclobenzaprine Hcl     REACTION: disoriented-Flexeril    Social History   Social History  . Marital status: Married    Spouse name: N/A  . Number of children: 1  . Years of education: N/A   Occupational History  . [employer]   Social History Main Topics  .  Smoking status: Former Smoker    Years: 6.00    Types: Cigarettes  . Smokeless tobacco: Never Used  . Alcohol use Yes     Comment: twice a week  . Drug use: No  . Sexual activity: Yes    Birth control/ protection: None   Other Topics Concern  . None   Social History Narrative  . None    Family History  Problem Relation Age of Onset  . Other Mother     digestive type issues  . Diabetes Mother   . Hypertension  Mother   . Heart attack Father   . Heart disease Father   . Other Brother     MVA  . Cancer Maternal Grandmother     uterine  . Emphysema Maternal Grandfather   . Alcohol abuse Brother   . Cancer Paternal Grandfather     colon

## 2015-10-21 DIAGNOSIS — M25562 Pain in left knee: Secondary | ICD-10-CM | POA: Diagnosis not present

## 2015-10-21 DIAGNOSIS — M5442 Lumbago with sciatica, left side: Secondary | ICD-10-CM | POA: Diagnosis not present

## 2015-10-30 DIAGNOSIS — M5442 Lumbago with sciatica, left side: Secondary | ICD-10-CM | POA: Diagnosis not present

## 2015-10-30 DIAGNOSIS — M25562 Pain in left knee: Secondary | ICD-10-CM | POA: Diagnosis not present

## 2015-11-04 DIAGNOSIS — M25562 Pain in left knee: Secondary | ICD-10-CM | POA: Diagnosis not present

## 2015-11-04 DIAGNOSIS — M5442 Lumbago with sciatica, left side: Secondary | ICD-10-CM | POA: Diagnosis not present

## 2015-11-07 ENCOUNTER — Other Ambulatory Visit: Payer: Self-pay | Admitting: Orthopedic Surgery

## 2015-11-07 DIAGNOSIS — G8929 Other chronic pain: Secondary | ICD-10-CM

## 2015-11-07 DIAGNOSIS — M5442 Lumbago with sciatica, left side: Principal | ICD-10-CM

## 2015-11-15 ENCOUNTER — Ambulatory Visit
Admission: RE | Admit: 2015-11-15 | Discharge: 2015-11-15 | Disposition: A | Discharge status: Home / Self Care | Payer: BLUE CROSS/BLUE SHIELD | Source: Ambulatory Visit | Attending: Orthopedic Surgery | Admitting: Orthopedic Surgery

## 2015-11-15 DIAGNOSIS — M5442 Lumbago with sciatica, left side: Principal | ICD-10-CM

## 2015-11-15 DIAGNOSIS — K7689 Other specified diseases of liver: Secondary | ICD-10-CM | POA: Diagnosis not present

## 2015-11-15 DIAGNOSIS — G8929 Other chronic pain: Secondary | ICD-10-CM

## 2015-11-15 MED ORDER — IOPAMIDOL (ISOVUE-300) INJECTION 61%
100.0000 mL | Freq: Once | INTRAVENOUS | Status: AC | PRN
  Administered 2015-11-15: 100 mL via INTRAVENOUS

## 2015-12-03 ENCOUNTER — Ambulatory Visit: Payer: BLUE CROSS/BLUE SHIELD | Admitting: Obstetrics & Gynecology

## 2015-12-13 ENCOUNTER — Encounter: Payer: Self-pay | Admitting: Obstetrics & Gynecology

## 2015-12-16 NOTE — Telephone Encounter (Signed)
done

## 2016-01-21 ENCOUNTER — Other Ambulatory Visit: Payer: Self-pay | Admitting: Obstetrics & Gynecology

## 2016-01-21 ENCOUNTER — Encounter: Payer: Self-pay | Admitting: Obstetrics & Gynecology

## 2016-01-21 MED ORDER — ESTRADIOL 0.1 MG/24HR TD PTTW: 1.0000 | MEDICATED_PATCH | TRANSDERMAL | 3 refills | Status: DC

## 2016-04-21 ENCOUNTER — Encounter: Payer: Self-pay | Admitting: Obstetrics & Gynecology

## 2016-04-22 ENCOUNTER — Other Ambulatory Visit: Payer: Self-pay | Admitting: Obstetrics & Gynecology

## 2016-04-22 MED ORDER — ESCITALOPRAM OXALATE 20 MG PO TABS: 20.0000 mg | ORAL_TABLET | Freq: Every day | ORAL | 11 refills | Status: DC

## 2016-05-17 ENCOUNTER — Encounter: Payer: Self-pay | Admitting: Obstetrics & Gynecology

## 2016-05-18 ENCOUNTER — Other Ambulatory Visit: Payer: Self-pay | Admitting: Obstetrics & Gynecology

## 2016-05-18 ENCOUNTER — Telehealth: Payer: Self-pay | Admitting: *Deleted

## 2016-05-18 MED ORDER — FLUOXETINE HCL 20 MG PO CAPS: 20.0000 mg | ORAL_CAPSULE | Freq: Every day | ORAL | 3 refills | Status: DC

## 2016-05-18 NOTE — Telephone Encounter (Signed)
Informed patient prescription sent to pharmacy

## 2016-06-10 ENCOUNTER — Encounter: Payer: Self-pay | Admitting: Obstetrics & Gynecology

## 2016-06-10 ENCOUNTER — Other Ambulatory Visit: Payer: Self-pay | Admitting: Obstetrics & Gynecology

## 2016-06-10 MED ORDER — VENLAFAXINE HCL ER 75 MG PO CP24: 75.0000 mg | ORAL_CAPSULE | Freq: Every day | ORAL | 3 refills | Status: DC

## 2016-07-17 ENCOUNTER — Encounter: Payer: Self-pay | Admitting: Obstetrics & Gynecology

## 2016-08-19 ENCOUNTER — Encounter: Payer: Self-pay | Admitting: Obstetrics and Gynecology

## 2016-08-19 ENCOUNTER — Other Ambulatory Visit (HOSPITAL_COMMUNITY)
Admission: RE | Admit: 2016-08-19 | Discharge: 2016-08-19 | Disposition: A | Discharge status: Home / Self Care | Payer: BLUE CROSS/BLUE SHIELD | Source: Ambulatory Visit | Attending: Obstetrics and Gynecology | Admitting: Obstetrics and Gynecology

## 2016-08-19 ENCOUNTER — Ambulatory Visit (INDEPENDENT_AMBULATORY_CARE_PROVIDER_SITE_OTHER): Payer: BLUE CROSS/BLUE SHIELD | Admitting: Obstetrics and Gynecology

## 2016-08-19 VITALS — BP 100/58 | HR 54 | Resp 16 | Ht 63.0 in | Wt 128.8 lb

## 2016-08-19 DIAGNOSIS — Z01419 Encounter for gynecological examination (general) (routine) without abnormal findings: Secondary | ICD-10-CM

## 2016-08-19 DIAGNOSIS — N950 Postmenopausal bleeding: Secondary | ICD-10-CM

## 2016-08-19 DIAGNOSIS — Z23 Encounter for immunization: Secondary | ICD-10-CM | POA: Diagnosis not present

## 2016-08-19 DIAGNOSIS — D352 Benign neoplasm of pituitary gland: Secondary | ICD-10-CM

## 2016-08-19 DIAGNOSIS — M546 Pain in thoracic spine: Secondary | ICD-10-CM | POA: Diagnosis not present

## 2016-08-19 DIAGNOSIS — M542 Cervicalgia: Secondary | ICD-10-CM | POA: Diagnosis not present

## 2016-08-19 NOTE — Progress Notes (Signed)
56 y.o. G25P0011 Married Caucasian female here for annual exam.    Had postmenopausal bleeding and received care from Dr. Elonda Husky.  Patient had pelvic US 05/29/15 showing EMS 8.4 mm. Ovaries normal.  EMB done 06/10/15 - benign endometrial tissue and no hyperplasia or malignancy.  Has been treated with various regimens of estrogen and progesterone.  Most recently was on unopposed Vivelle Dot. She stopped Vivelle Dot patch 0.1 mg and Effexor one month ago.  Her bleeding finally stopped 7 - 8 days after she stopped the patch.  Her last progesterone was about 8 months ago.   Now reports a jolting feeling in her body.  Decreased memory.  Having a lot of hot flashes which are very troublesome to her.   Has tried Celexa, Effexor, Prozac and nothing calms her down.  Clonazepam helps her. Did well with Wellbutrin in the past, but she stopped this because she felt well.  Very stressful work.  Not feeling herself.   Lymph node enlargement comes and goes.   Has a prolactinoma. Her imaging was done many years ago to confirm this.  From [place].  Married and 1 child.   PCP:  None  Patient's last menstrual period was 07/05/2016 (approximate).           Sexually active: Yes.   female The current method of family planning is vasectomy.    Exercising: Yes.    Weights, biking and cardio Smoker:  no  Health Maintenance: Pap: 08-29-14 normal per patient; 08-11-12 Neg History of abnormal Pap:  Yes, 1996 hx of cryotherapy MMG: 08/2015 [facility] patient [facility] Colonoscopy:  2010 microscopic colitis with Dr.Robert Kaplan;next due 2020. BMD:   n/a  Result  n/a TDaP:  Greater than 10 years Gardasil:   no HIV: would like today Hep C:would like today Screening Labs:      reports that she has quit smoking. Her smoking use included Cigarettes. She quit after 6.00 years of use. She has never used smokeless tobacco. She reports that she drinks about 1.2 oz of alcohol per week . She reports that  she does not use drugs.  Past Medical History:  Diagnosis Date  . Anxiety   . Chronic constipation   . Colitis   . Depression   . Fibroid   . H/O cold sores   . Hormone replacement therapy (HRT) 11/14/2014  . Hot flashes 11/14/2014  . Increased endometrial stripe thickness 05/30/2015   Will get biopsy  . Insomnia   . Liver cyst   . Menopause 11/14/2014  . Moody 05/23/2015  . Nerves 11/14/2014  . Night sweats 11/14/2014  . PMB (postmenopausal bleeding) 05/23/2015  . Prolactinoma ([place])   . Rectocele 05/23/2015    Past Surgical History:  Procedure Laterality Date  . AUGMENTATION MAMMAPLASTY  2000   saline implants  . BREAST ENHANCEMENT SURGERY  1996  . CARPAL TUNNEL RELEASE    . TONSILECTOMY, ADENOIDECTOMY, BILATERAL MYRINGOTOMY AND TUBES      Current Outpatient Prescriptions  Medication Sig Dispense Refill  . BIOTIN PO Take 10,000 mg by mouth daily.    Chong Sicilian Oil-GLA-Linoleic Acid (BORAGE PO) Take 1,300 mg by mouth daily.    . Calcium-Magnesium-Vitamin D (CALCIUM MAGNESIUM PO) Take by mouth daily.    . Cholecalciferol (VITAMIN D-3 PO) Take by mouth daily.    . COLLAGEN PO Take by mouth daily.    . diclofenac (CATAFLAM) 50 MG tablet Take 50 mg by mouth 3 (three) times daily. Patient states  taking 75mg  prn    . GLUTATHIONE PO Take 500 mg by mouth daily.    . montelukast (SINGULAIR) 10 MG tablet Take 1 tablet (10 mg total) by mouth at bedtime. 90 tablet 3  . Naproxen Sodium (ALEVE PO) Take by mouth daily as needed.     . NON FORMULARY Swiss Kriss-every night    . Omega-3 Fatty Acids (FISH OIL PO) Take by mouth. DHA and EFA-daily    . vitamin E 1000 UNIT capsule Take 1,000 Units by mouth daily.    [name] Kitchen zolpidem (AMBIEN) 10 MG tablet Take 10 mg by mouth at bedtime as needed for sleep.    [name] Kitchen estradiol (VIVELLE-DOT) 0.1 MG/24HR patch Place 1 patch (0.1 mg total) onto the skin 2 (two) times a week. (Patient not taking: Reported on 08/19/2016) 25 patch 3  . Estradiol 0.52 MG/0.87 GM  (0.06%) GEL Apply 1 application topically daily. (Patient not taking: Reported on 10/17/2015) 1 Bottle 11  . progesterone (PROMETRIUM) 200 MG capsule Take 1 nightly (Patient not taking: Reported on 08/19/2016) 30 capsule 11  . venlafaxine XR (EFFEXOR-XR) 75 MG 24 hr capsule Take 1 capsule (75 mg total) by mouth daily. (Patient not taking: Reported on 08/19/2016) 30 capsule 3   No current facility-administered medications for this visit.     Family History  Problem Relation Age of Onset  . Other Mother        digestive type issues  . Diabetes Mother   . Hypertension Mother   . Heart attack Father   . Heart disease Father   . Other Brother        MVA  . Cancer Maternal Grandmother        uterine  . Emphysema Maternal Grandfather   . Alcohol abuse Brother   . Cancer Paternal Grandfather        colon    ROS:  Pertinent items are noted in HPI.  Otherwise, a comprehensive ROS was negative.  Exam:   BP (!) 100/58 (BP Location: Right Arm, Patient Position: Sitting, Cuff Size: Small)   Pulse (!) 54   Resp 16   Ht 5\' 3"  (1.6 m)   Wt 128 lb 12.8 oz (58.4 kg)   LMP 07/05/2016 (Approximate)   BMI 22.82 kg/m     General appearance: alert, cooperative and appears stated age Head: Normocephalic, without obvious abnormality, atraumatic Neck: no adenopathy, supple, symmetrical, trachea midline and thyroid normal to inspection and palpation Lungs: clear to auscultation bilaterally Breasts: normal appearance, no masses or tenderness, No nipple retraction or dimpling, No nipple discharge or bleeding, No axillary or supraclavicular adenopathy Heart: regular rate and rhythm Abdomen: soft, non-tender; no masses, no organomegaly Extremities: extremities normal, atraumatic, no cyanosis or edema Skin: Skin color, texture, turgor normal. No rashes or lesions Lymph nodes: Cervical, supraclavicular, and axillary nodes normal. No abnormal inguinal nodes palpated Neurologic: Grossly normal  Pelvic:  External genitalia:  no lesions              Urethra:  normal appearing urethra with no masses, tenderness or lesions              Bartholins and Skenes: normal                 Vagina: normal appearing vagina with normal color and discharge, no lesions              Cervix: no lesions              Pap  taken: Yes.   Bimanual Exam:  Uterus:  normal size, contour, position, consistency, mobility, non-tender              Adnexa: no mass, fullness, tenderness              Rectal exam: Yes.  .  Confirms.              Anus:  normal sphincter tone, no lesions  Chaperone was present for exam.  Assessment:   Well woman visit with normal exam. Postmenopausal bleeding.  Was on unopposed estrogen.  Prolactinoma.  Anxiety. Menopausal symptoms.   Plan: Mammogram screening discussed.  She will schedule this. Recommended self breast awareness. Pap and HR HPV as above. Guidelines for Calcium, Vitamin D, regular exercise program including cardiovascular and weight bearing exercise. Will check FHS, estradiol, prolactin, and routine labs. Return for sonohysterogram and EMB. May consider Wellbutrin and counseling to help with her anxiety. TDap today. Follow up annually and prn.   After visit summary provided.

## 2016-08-20 LAB — COMPREHENSIVE METABOLIC PANEL
A/G RATIO: 2.3 — AB (ref 1.2–2.2)
ALT: 20 IU/L (ref 0–32)
AST: 29 IU/L (ref 0–40)
Albumin: 4.6 g/dL (ref 3.5–5.5)
Alkaline Phosphatase: 38 IU/L — ABNORMAL LOW (ref 39–117)
BUN/Creatinine Ratio: 22 (ref 9–23)
BUN: 21 mg/dL (ref 6–24)
Bilirubin Total: 0.5 mg/dL (ref 0.0–1.2)
CO2: 24 mmol/L (ref 20–29)
Calcium: 9.6 mg/dL (ref 8.7–10.2)
Chloride: 101 mmol/L (ref 96–106)
Creatinine, Ser: 0.94 mg/dL (ref 0.57–1.00)
GFR, EST AFRICAN AMERICAN: 80 mL/min/{1.73_m2} (ref >59)
GFR, EST NON AFRICAN AMERICAN: 69 mL/min/{1.73_m2} (ref >59)
Globulin, Total: 2 g/dL (ref 1.5–4.5)
Glucose: 91 mg/dL (ref 65–99)
POTASSIUM: 4.7 mmol/L (ref 3.5–5.2)
SODIUM: 140 mmol/L (ref 134–144)
Total Protein: 6.6 g/dL (ref 6.0–8.5)

## 2016-08-20 LAB — ESTRADIOL

## 2016-08-20 LAB — CBC
HEMATOCRIT: 42.7 % (ref 34.0–46.6)
Hemoglobin: 14.2 g/dL (ref 11.1–15.9)
MCH: 32.3 pg (ref 26.6–33.0)
MCHC: 33.3 g/dL (ref 31.5–35.7)
MCV: 97 fL (ref 79–97)
PLATELETS: 228 10*3/uL (ref 150–379)
RBC: 4.39 x10E6/uL (ref 3.77–5.28)
RDW: 13.5 % (ref 12.3–15.4)
WBC: 8.6 10*3/uL (ref 3.4–10.8)

## 2016-08-20 LAB — LIPID PANEL
CHOLESTEROL TOTAL: 206 mg/dL — AB (ref 100–199)
Chol/HDL Ratio: 2.2 ratio (ref 0.0–4.4)
HDL: 93 mg/dL (ref >39)
LDL CALC: 94 mg/dL (ref 0–99)
TRIGLYCERIDES: 95 mg/dL (ref 0–149)
VLDL CHOLESTEROL CAL: 19 mg/dL (ref 5–40)

## 2016-08-20 LAB — PROLACTIN: Prolactin: 21.7 ng/mL (ref 4.8–23.3)

## 2016-08-20 LAB — TSH: TSH: 1.66 u[IU]/mL (ref 0.450–4.500)

## 2016-08-20 LAB — FOLLICLE STIMULATING HORMONE: FSH: 130.9 m[IU]/mL

## 2016-08-21 LAB — CYTOLOGY - PAP
DIAGNOSIS: NEGATIVE
HPV (WINDOPATH): NOT DETECTED

## 2016-09-03 ENCOUNTER — Ambulatory Visit (INDEPENDENT_AMBULATORY_CARE_PROVIDER_SITE_OTHER): Payer: BLUE CROSS/BLUE SHIELD | Admitting: Obstetrics and Gynecology

## 2016-09-03 ENCOUNTER — Encounter: Payer: Self-pay | Admitting: Obstetrics and Gynecology

## 2016-09-03 ENCOUNTER — Other Ambulatory Visit: Payer: Self-pay | Admitting: Obstetrics and Gynecology

## 2016-09-03 ENCOUNTER — Ambulatory Visit (INDEPENDENT_AMBULATORY_CARE_PROVIDER_SITE_OTHER): Payer: BLUE CROSS/BLUE SHIELD

## 2016-09-03 VITALS — BP 108/60 | HR 60 | Ht 63.0 in | Wt 128.0 lb

## 2016-09-03 DIAGNOSIS — N950 Postmenopausal bleeding: Secondary | ICD-10-CM

## 2016-09-03 DIAGNOSIS — N951 Menopausal and female climacteric states: Secondary | ICD-10-CM | POA: Diagnosis not present

## 2016-09-03 DIAGNOSIS — F439 Reaction to severe stress, unspecified: Secondary | ICD-10-CM

## 2016-09-03 DIAGNOSIS — N858 Other specified noninflammatory disorders of uterus: Secondary | ICD-10-CM | POA: Diagnosis not present

## 2016-09-03 MED ORDER — BUPROPION HCL ER (XL) 150 MG PO TB24: 150.0000 mg | ORAL_TABLET | Freq: Every day | ORAL | 0 refills | Status: DC

## 2016-09-03 MED ORDER — PROGESTERONE MICRONIZED 100 MG PO CAPS: 100.0000 mg | ORAL_CAPSULE | Freq: Every day | ORAL | 0 refills | Status: DC

## 2016-09-03 MED ORDER — ESTRADIOL 1 MG PO TABS: 1.0000 mg | ORAL_TABLET | Freq: Every day | ORAL | 0 refills | Status: DC

## 2016-09-03 NOTE — Progress Notes (Signed)
GYNECOLOGY  VISIT   HPI: 56 y.o.   Married  Caucasian  female   G2P0011 with No LMP recorded. Patient is not currently having periods (Reason: Other).   here for sonohysterogram for postmenopausal bleeding.    Prior US one year ago showed EMS 8.4 mm and normal ovaries.  EMB was benign.   FSH  130.9 E2 < 5.  Used multiple HRT regimens in the past. Had problems with transdermal patches peeling off.  Night sweats.   Feeling stressed at work and brings it home.  GYNECOLOGIC HISTORY: No LMP recorded. Patient is not currently having periods (Reason: Other). Contraception: Vasectomy Menopausal hormone therapy: Vivelle Dot 0.1mg  Last mammogram: 10-07-15 Implant/Density C/Neg/BiRads1:[facility] Last pap smear:  08-29-14 normal per patient; 08-11-12 Neg         OB History    Gravida Para Term Preterm AB Living   2 1     1 1    SAB TAB Ectopic Multiple Live Births   1                 Patient Active Problem List   Diagnosis Date Noted  . Increased endometrial stripe thickness 05/30/2015  . Moody 05/23/2015  . PMB (postmenopausal bleeding) 05/23/2015  . Rectocele 05/23/2015  . Menopause 11/14/2014  . Hot flashes 11/14/2014  . Night sweats 11/14/2014  . Nerves 11/14/2014  . Hormone replacement therapy (HRT) 11/14/2014  . Unspecified constipation 11/14/2012  . Tennis elbow 10/30/2010  . Rotator cuff syndrome of left shoulder 10/30/2010  . Muscle spasms of neck 10/30/2010  . GERD 11/11/2009  . Microscopic colitis 11/11/2009    Past Medical History:  Diagnosis Date  . Anxiety   . Chronic constipation   . Colitis   . Depression   . Fibroid   . H/O cold sores   . Hormone replacement therapy (HRT) 11/14/2014  . Hot flashes 11/14/2014  . Increased endometrial stripe thickness 05/30/2015   Will get biopsy  . Insomnia   . Liver cyst   . Menopause 11/14/2014  . Moody 05/23/2015  . Nerves 11/14/2014  . Night sweats 11/14/2014  . PMB (postmenopausal bleeding) 05/23/2015  .  Prolactinoma ([place])   . Rectocele 05/23/2015    Past Surgical History:  Procedure Laterality Date  . AUGMENTATION MAMMAPLASTY  2000   saline implants  . BREAST ENHANCEMENT SURGERY  1996  . CARPAL TUNNEL RELEASE    . TONSILECTOMY, ADENOIDECTOMY, BILATERAL MYRINGOTOMY AND TUBES      Current Outpatient Prescriptions  Medication Sig Dispense Refill  . BIOTIN PO Take 10,000 mg by mouth daily.    Chong Sicilian Oil-GLA-Linoleic Acid (BORAGE PO) Take 1,300 mg by mouth daily.    . Calcium-Magnesium-Vitamin D (CALCIUM MAGNESIUM PO) Take by mouth daily.    . Cholecalciferol (VITAMIN D-3 PO) Take by mouth daily.    . COLLAGEN PO Take by mouth daily.    . diclofenac (CATAFLAM) 50 MG tablet Take 50 mg by mouth 3 (three) times daily. Patient states taking 75mg  prn    . GLUTATHIONE PO Take 500 mg by mouth daily.    . montelukast (SINGULAIR) 10 MG tablet Take 1 tablet (10 mg total) by mouth at bedtime. 90 tablet 3  . Naproxen Sodium (ALEVE PO) Take by mouth daily as needed.     . Omega-3 Fatty Acids (FISH OIL PO) Take by mouth. DHA and EFA-daily    . vitamin E 1000 UNIT capsule Take 1,000 Units by mouth daily.    [name] Kitchen zolpidem (  AMBIEN) 10 MG tablet Take 10 mg by mouth at bedtime as needed for sleep.     No current facility-administered medications for this visit.      ALLERGIES: Cyclobenzaprine hcl  Family History  Problem Relation Age of Onset  . Other Mother        digestive type issues  . Diabetes Mother   . Hypertension Mother   . Heart attack Father   . Heart disease Father   . Other Brother        MVA  . Cancer Maternal Grandmother        uterine  . Emphysema Maternal Grandfather   . Alcohol abuse Brother   . Cancer Paternal Grandfather        colon    Social History   Social History  . Marital status: Married    Spouse name: N/A  . Number of children: 1  . Years of education: N/A   Occupational History  . [employer]   Social History Main Topics  .  Smoking status: Former Smoker    Years: 6.00    Types: Cigarettes  . Smokeless tobacco: Never Used  . Alcohol use 1.2 oz/week    2 Glasses of wine per week     Comment: twice a week  . Drug use: No  . Sexual activity: Yes    Partners: Male    Birth control/ protection: None, Other-see comments     Comment: husband with vasectomy   Other Topics Concern  . Not on file   Social History Narrative  . No narrative on file    ROS:  Pertinent items are noted in HPI.  PHYSICAL EXAMINATION:    BP 108/60 (BP Location: Right Arm, Patient Position: Sitting, Cuff Size: Normal)   Pulse 60   Ht 5\' 3"  (1.6 m)   Wt 128 lb (58.1 kg)   BMI 22.67 kg/m     General appearance: alert, cooperative and appears stated age  Pelvic US: Uterus with no masses.  EMS 3.82 mm. Ovaries normal and left ovary with small follicle.  No free fluid.   Sonohysterogram/EMB. Consent for procedures.  Sterile prep with Hibiclens. Tenaculum to anterior cervical lip.  Cannula placed and NS injected.  No filling defects noted.  Reprep with Hibiclens.  Tenaculum to anterior cervical lip and Pipelle passed to 7 cm twice.  Small amount of tissue to pathology.  No complications.  Minimal EBL.   Chaperone was present for exam.  ASSESSMENT  Postmenopausal bleeding.  Menopausal symptoms. Situational stress.  PLAN  Follow up biopsy.  Instructions and precautions given.  OK for HRT - Estrace 1 mg daily and Prometrium 100 mg q hs.  (I told her we can decrease the Estrace to 0.5 mg daily if she has swelling.) We did discuss the WHI and risks of DVT, PE, MI, stroke, and breast cancer.  Start Wellbutrin  Xl 150 mg daily.  Discussed potential side effects.  Follow up in 2 months.  I did give her a brochure for [employer].  She will consider this.    An After Visit Summary was printed and given to the patient.  __15____ minutes face to face time of which over 50% was spent in counseling.

## 2016-09-03 NOTE — Progress Notes (Signed)
Encounter reviewed by Dr. Ramel Tobon Amundson C. Silva.  

## 2016-09-03 NOTE — Patient Instructions (Signed)

## 2016-09-16 DIAGNOSIS — M9901 Segmental and somatic dysfunction of cervical region: Secondary | ICD-10-CM | POA: Diagnosis not present

## 2016-09-17 DIAGNOSIS — M9901 Segmental and somatic dysfunction of cervical region: Secondary | ICD-10-CM | POA: Diagnosis not present

## 2016-09-21 DIAGNOSIS — M9901 Segmental and somatic dysfunction of cervical region: Secondary | ICD-10-CM | POA: Diagnosis not present

## 2016-09-29 DIAGNOSIS — M9901 Segmental and somatic dysfunction of cervical region: Secondary | ICD-10-CM | POA: Diagnosis not present

## 2016-09-30 DIAGNOSIS — M9901 Segmental and somatic dysfunction of cervical region: Secondary | ICD-10-CM | POA: Diagnosis not present

## 2016-10-01 DIAGNOSIS — M9901 Segmental and somatic dysfunction of cervical region: Secondary | ICD-10-CM | POA: Diagnosis not present

## 2016-10-05 DIAGNOSIS — M9901 Segmental and somatic dysfunction of cervical region: Secondary | ICD-10-CM | POA: Diagnosis not present

## 2016-10-07 DIAGNOSIS — M9901 Segmental and somatic dysfunction of cervical region: Secondary | ICD-10-CM | POA: Diagnosis not present

## 2016-10-12 DIAGNOSIS — M9901 Segmental and somatic dysfunction of cervical region: Secondary | ICD-10-CM | POA: Diagnosis not present

## 2016-10-19 DIAGNOSIS — M9901 Segmental and somatic dysfunction of cervical region: Secondary | ICD-10-CM | POA: Diagnosis not present

## 2016-10-22 DIAGNOSIS — M9901 Segmental and somatic dysfunction of cervical region: Secondary | ICD-10-CM | POA: Diagnosis not present

## 2016-10-26 DIAGNOSIS — M9901 Segmental and somatic dysfunction of cervical region: Secondary | ICD-10-CM | POA: Diagnosis not present

## 2016-10-29 DIAGNOSIS — M9901 Segmental and somatic dysfunction of cervical region: Secondary | ICD-10-CM | POA: Diagnosis not present

## 2016-10-30 DIAGNOSIS — Z1231 Encounter for screening mammogram for malignant neoplasm of breast: Secondary | ICD-10-CM | POA: Diagnosis not present

## 2016-10-30 DIAGNOSIS — D229 Melanocytic nevi, unspecified: Secondary | ICD-10-CM | POA: Diagnosis not present

## 2016-10-30 DIAGNOSIS — L570 Actinic keratosis: Secondary | ICD-10-CM | POA: Diagnosis not present

## 2016-10-30 DIAGNOSIS — I83819 Varicose veins of unspecified lower extremities with pain: Secondary | ICD-10-CM | POA: Diagnosis not present

## 2016-10-30 DIAGNOSIS — Z9882 Breast implant status: Secondary | ICD-10-CM | POA: Diagnosis not present

## 2016-11-01 ENCOUNTER — Other Ambulatory Visit: Payer: Self-pay | Admitting: Obstetrics and Gynecology

## 2016-11-02 ENCOUNTER — Telehealth: Payer: Self-pay | Admitting: *Deleted

## 2016-11-02 DIAGNOSIS — M9901 Segmental and somatic dysfunction of cervical region: Secondary | ICD-10-CM | POA: Diagnosis not present

## 2016-11-02 NOTE — Telephone Encounter (Signed)
Error

## 2016-11-03 ENCOUNTER — Ambulatory Visit: Payer: BLUE CROSS/BLUE SHIELD | Admitting: Obstetrics and Gynecology

## 2016-11-04 MED ORDER — BUPROPION HCL ER (XL) 150 MG PO TB24: 150.0000 mg | ORAL_TABLET | Freq: Every day | ORAL | 0 refills | Status: DC

## 2016-11-04 MED ORDER — ESTRADIOL 1 MG PO TABS: 1.0000 mg | ORAL_TABLET | Freq: Every day | ORAL | 0 refills | Status: DC

## 2016-11-04 MED ORDER — PROGESTERONE MICRONIZED 100 MG PO CAPS: 100.0000 mg | ORAL_CAPSULE | Freq: Every day | ORAL | 0 refills | Status: DC

## 2016-11-04 NOTE — Telephone Encounter (Signed)
Patient calling for her prescription request from 11/01/16. Patient really needs the refills as she is on her last pill. Please call patient with an update.

## 2016-11-04 NOTE — Telephone Encounter (Signed)
Medication refill request: buPROpion  Last AEX:  08/19/16 BS Next AEX: not scheduled yet Last MMG (if hormonal medication request): 10-07-15 Implant/Density C/Neg/BiRads1:[facility] Refill authorized: 09/04/26 #90 w/0 refills; today please advise  Medication refill request: Estradiol Refill authorized: 09/03/16 #90 w/0 refills; today please advise  Medication refill request: Progesterone  Refill authorized: 09/03/16 #90 w/0 refills; today please advise

## 2016-11-10 ENCOUNTER — Encounter: Payer: Self-pay | Admitting: Obstetrics and Gynecology

## 2016-11-18 ENCOUNTER — Ambulatory Visit (INDEPENDENT_AMBULATORY_CARE_PROVIDER_SITE_OTHER): Payer: BLUE CROSS/BLUE SHIELD | Admitting: Obstetrics and Gynecology

## 2016-11-18 ENCOUNTER — Encounter: Payer: Self-pay | Admitting: Obstetrics and Gynecology

## 2016-11-18 VITALS — BP 100/68 | HR 66 | Ht 63.0 in | Wt 135.0 lb

## 2016-11-18 DIAGNOSIS — F439 Reaction to severe stress, unspecified: Secondary | ICD-10-CM

## 2016-11-18 DIAGNOSIS — Z79890 Hormone replacement therapy (postmenopausal): Secondary | ICD-10-CM | POA: Diagnosis not present

## 2016-11-18 MED ORDER — BUPROPION HCL ER (XL) 300 MG PO TB24: 300.0000 mg | ORAL_TABLET | Freq: Every day | ORAL | 2 refills | Status: DC

## 2016-11-18 MED ORDER — PROGESTERONE MICRONIZED 100 MG PO CAPS: 100.0000 mg | ORAL_CAPSULE | Freq: Every day | ORAL | 2 refills | Status: DC

## 2016-11-18 MED ORDER — ESTRADIOL 1 MG PO TABS: 1.0000 mg | ORAL_TABLET | Freq: Every day | ORAL | 2 refills | Status: DC

## 2016-11-18 NOTE — Progress Notes (Signed)
GYNECOLOGY  VISIT   HPI: 56 y.o.   Married  Caucasian  female   G2P0011 with Patient's last menstrual period was 01/06/2008 (approximate).   here for follow up after starting Wellbutrin 150 mg XL daily. Helps a little bit but not much. Has tried multiple meds in the past and no getting a good response.  Feels OCD.  Lots of stress at work.  Not able to discuss this with her husband.  States she does not have anyone to talk to.  She has also started on a new HRT regimen.  Taking Estrace 1 mg and Prometrium 100 mg q hs. No more spotting.  ROS positive for: Weight gain. Craving sweets. Sinusitis. Constipation.  Loss of sexual interest.  Muscle/joint pain.  Depress and severe anxiety.  GYNECOLOGIC HISTORY: Patient's last menstrual period was 01/06/2008 (approximate). Contraception:  Vasectomy Menopausal hormone therapy:  Vivelle Dot 0.1mg  Last mammogram: 10-30-16 Bil.Implants/Density C/Neg/BiRads1:[facility] Last pap smear: 08-19-16 Neg:Neg HR HPV                              08-29-14 Neg OB History    Gravida Para Term Preterm AB Living   2 1     1 1    SAB TAB Ectopic Multiple Live Births   1                 Patient Active Problem List   Diagnosis Date Noted  . Increased endometrial stripe thickness 05/30/2015  . Moody 05/23/2015  . PMB (postmenopausal bleeding) 05/23/2015  . Rectocele 05/23/2015  . Menopause 11/14/2014  . Hot flashes 11/14/2014  . Night sweats 11/14/2014  . Nerves 11/14/2014  . Hormone replacement therapy (HRT) 11/14/2014  . Unspecified constipation 11/14/2012  . Tennis elbow 10/30/2010  . Rotator cuff syndrome of left shoulder 10/30/2010  . Muscle spasms of neck 10/30/2010  . GERD 11/11/2009  . Microscopic colitis 11/11/2009    Past Medical History:  Diagnosis Date  . Anxiety   . Chronic constipation   . Colitis   . Depression   . Fibroid   . H/O cold sores   . Hormone replacement therapy (HRT) 11/14/2014  . Hot flashes  11/14/2014  . Increased endometrial stripe thickness 05/30/2015   Will get biopsy  . Insomnia   . Liver cyst   . Menopause 11/14/2014  . Moody 05/23/2015  . Nerves 11/14/2014  . Night sweats 11/14/2014  . PMB (postmenopausal bleeding) 05/23/2015  . Prolactinoma ([place])   . Rectocele 05/23/2015    Past Surgical History:  Procedure Laterality Date  . AUGMENTATION MAMMAPLASTY  2000   saline implants  . BREAST ENHANCEMENT SURGERY  1996  . CARPAL TUNNEL RELEASE    . TONSILECTOMY, ADENOIDECTOMY, BILATERAL MYRINGOTOMY AND TUBES      Current Outpatient Medications  Medication Sig Dispense Refill  . BIOTIN PO Take 10,000 mg by mouth daily.    Chong Sicilian Oil-GLA-Linoleic Acid (BORAGE PO) Take 1,300 mg by mouth daily.    [name] Kitchen buPROPion (WELLBUTRIN XL) 150 MG 24 hr tablet Take 1 tablet (150 mg total) by mouth daily. 90 tablet 0  . Calcium-Magnesium-Vitamin D (CALCIUM MAGNESIUM PO) Take by mouth daily.    . Cholecalciferol (VITAMIN D-3 PO) Take by mouth daily.    . COLLAGEN PO Take by mouth daily.    . diclofenac (CATAFLAM) 50 MG tablet Take 50 mg by mouth 3 (three) times daily. Patient states taking 75mg  prn    .  estradiol (ESTRACE) 1 MG tablet Take 1 tablet (1 mg total) by mouth daily. 90 tablet 0  . GLUTATHIONE PO Take 500 mg by mouth daily.    . montelukast (SINGULAIR) 10 MG tablet Take 1 tablet (10 mg total) by mouth at bedtime. 90 tablet 3  . Naproxen Sodium (ALEVE PO) Take by mouth daily as needed.     . Omega-3 Fatty Acids (FISH OIL PO) Take by mouth. DHA and EFA-daily    . progesterone (PROMETRIUM) 100 MG capsule Take 1 capsule (100 mg total) by mouth daily. 90 capsule 0  . vitamin E 1000 UNIT capsule Take 1,000 Units by mouth daily.    [name] Kitchen zolpidem (AMBIEN) 10 MG tablet Take 10 mg by mouth at bedtime as needed for sleep.     No current facility-administered medications for this visit.      ALLERGIES: Cyclobenzaprine hcl  Family History  Problem Relation Age of Onset  . Other Mother         digestive type issues  . Diabetes Mother   . Hypertension Mother   . Heart attack Father   . Heart disease Father   . Other Brother        MVA  . Cancer Maternal Grandmother        uterine  . Emphysema Maternal Grandfather   . Alcohol abuse Brother   . Cancer Paternal Grandfather        colon    Social History   Socioeconomic History  . Marital status: Married    Spouse name: Not on file  . Number of children: 1  . Years of education: Not on file  . Highest education level: Not on file  Social Needs  . Financial resource strain: Not on file  . Food insecurity - worry: Not on file  . Food insecurity - inability: Not on file  . Transportation needs - medical: Not on file  . Transportation needs - non-medical: Not on file  Occupational History  . Occupation: Best boy: [employer]  Tobacco Use  . Smoking status: Former Smoker    Years: 6.00    Types: Cigarettes  . Smokeless tobacco: Never Used  Substance and Sexual Activity  . Alcohol use: Yes    Alcohol/week: 1.2 oz    Types: 2 Glasses of wine per week    Comment: twice a week  . Drug use: No  . Sexual activity: Yes    Partners: Male    Birth control/protection: None, Other-see comments    Comment: husband with vasectomy  Other Topics Concern  . Not on file  Social History Narrative  . Not on file    ROS:  Pertinent items are noted in HPI.  PHYSICAL EXAMINATION:    BP 100/68 (BP Location: Right Arm, Patient Position: Sitting, Cuff Size: Normal)   Pulse 66   Ht 5\' 3"  (1.6 m)   Wt 135 lb (61.2 kg)   LMP 01/06/2008 (Approximate)   BMI 23.91 kg/m     General appearance: alert, cooperative and appears stated age  ASSESSMENT  HRT doing well.  Situational stress.   PLAN  Continue current HRT.  Refills until annual exam is due.  Increase to Wellbutrin XL 300 mg daily with refills until annual exam is due. We talked about referral to psychiatry if this does not help her.   We reviewed the benefits of counseling and I do recommend this for her.  She is  open to the idea and will consider [place] counseling. Follow up for annual exam and prn.   An After Visit Summary was printed and given to the patient.  __25____ minutes face to face time of which over 50% was spent in counseling.

## 2016-11-19 ENCOUNTER — Telehealth: Payer: Self-pay | Admitting: *Deleted

## 2016-11-19 DIAGNOSIS — I83812 Varicose veins of left lower extremities with pain: Secondary | ICD-10-CM | POA: Diagnosis not present

## 2016-11-19 NOTE — Telephone Encounter (Signed)
Spoke with Lorre Nick at [facility]. Advised patient's Bupropion XL dosage should be 300 mg daily. Verbalized understanding and stated she would document on patient's chart.   Encounter closed.

## 2017-01-25 ENCOUNTER — Telehealth: Payer: Self-pay | Admitting: Obstetrics and Gynecology

## 2017-01-25 NOTE — Telephone Encounter (Signed)
Spoke with pharmacist at [facility]. Pharmacist states that patient does not need PA. Both generic Wellbutrin and Progesterone cost more because patient has not met her deductible. Patient has been informed. Advised patient that Dr. Quincy Simmonds is currently out of the office and I would discuss with another provider as to steps to do next. Please advise.

## 2017-01-25 NOTE — Telephone Encounter (Signed)
Patient called and said her progesterone and generic Wellbutrin both require prior-authorization with her insurance company.  [facility] [id number]

## 2017-01-26 NOTE — Telephone Encounter (Signed)
I don't understand the question? She doesn't need prior approval? It looks like refills were placed on the prior scripts. If she doesn't want those meds she will need to come in to discuss other options.

## 2017-01-27 NOTE — Telephone Encounter (Signed)
Spoke with Merrilee Seashore at [facility]. Per pharmacist, no PA needed for either med. Tried calling patient to see if she wishes to pay the out of pocket expense until she meets her deductible or if she would like to come in for OV to discuss other options. No answer, Message left for patient to return my call.

## 2017-01-27 NOTE — Telephone Encounter (Signed)
Spoke with patient. Per patient, Wellbutrin will cost $110 and Progesterone - $96 for 90-day supply. Patient states that insurance will cover OCP if prescribed because it is considered preventive. Advised patient that I will try to initiate prior authorization again. If PA is not needed, will call back to schedule appointment to discuss other options.

## 2017-01-28 NOTE — Telephone Encounter (Signed)
Spoke with patient. Patient states that she will continue to pay the out of pocket expense. Advised patient to give our office a call if she wishes to come in and discuss other options. Closing encounter.

## 2017-06-22 ENCOUNTER — Telehealth: Payer: Self-pay | Admitting: Obstetrics and Gynecology

## 2017-06-22 NOTE — Telephone Encounter (Signed)
I received a notice from [facility] about potential patient discontinuation of Wellbutrin XL. Please have her schedule an annual exam with me for August, 2019.   I am happy to see her sooner if she would like an appointment to discuss options.

## 2017-06-23 NOTE — Telephone Encounter (Signed)
Spoke with patient, advised as seen below per Dr. Quincy Simmonds. Patient states she has not discontinued Wellbutrin, but does not take daily. Takes every other day. Patient aware this is may not be effective, bit does not think she needs it daily.   AEX scheduled for 8/30 at 2pm. Patient declined earlier appt to discuss alternative options, will discuss at AEX.   Routing to provider for final review. Patient is agreeable to disposition. Will close encounter.

## 2017-07-13 ENCOUNTER — Other Ambulatory Visit: Payer: Self-pay | Admitting: Obstetrics and Gynecology

## 2017-07-13 MED ORDER — ESTRADIOL 1 MG PO TABS: 1.0000 mg | ORAL_TABLET | Freq: Every day | ORAL | 0 refills | Status: DC

## 2017-07-13 NOTE — Telephone Encounter (Signed)
Medication refill request: Wellbutrin XL 300mg  tab  Last AEX:  08/19/16 Next AEX: 09/03/17 Last MMG (if hormonal medication request): 10/30/16 Bi Rads Category 1 Neg  Refill authorized: Please refill if appropriate

## 2017-07-13 NOTE — Telephone Encounter (Signed)
Medication refill request: Estradiol Tab 1mg  Last AEX:  08/19/16 Next AEX: 09/03/17 Last MMG (if hormonal medication request): 10/30/16 Bi-rads Category 1 neg.  Refill authorized: please refill if appropriate.

## 2017-07-15 ENCOUNTER — Telehealth: Payer: Self-pay | Admitting: *Deleted

## 2017-07-15 NOTE — Telephone Encounter (Signed)
Message left to return call to [facility] at [phone].   Patient needs to confirm estradiol and prometrium need to go to [facility].

## 2017-07-20 NOTE — Telephone Encounter (Signed)
Message left to return call to [facility] at [phone].   Need patient to confirm both estradiol and prometrium need to go to [facility].

## 2017-07-22 NOTE — Telephone Encounter (Signed)
Message left to return call to [facility] at [phone].    Need patient to confirm both estradiol and prometrium need to go to [facility] and if she needs refilled prior to aex scheduled 09-03-17.

## 2017-07-23 NOTE — Telephone Encounter (Signed)
Dr. Quincy Simmonds, okay to close encounter? Fax request received from [facility] for estradiol and prometrium. I have attempted to contact this patient 3 times to verify that she in fact needs refill of estradiol and prometrium prior to aex on 09-03-17 and to verify that both need to go to [facility]. Patient has not returned call. Please advise.

## 2017-07-25 NOTE — Telephone Encounter (Signed)
I think she has refills of her HRT until her annual exam.  I did close the encounter.

## 2017-08-30 ENCOUNTER — Telehealth: Payer: Self-pay | Admitting: Obstetrics and Gynecology

## 2017-08-31 NOTE — Telephone Encounter (Signed)
Patient scheduled for 09/29/17 at 3 pm and she was added to Dr Elza Rafter wait list.

## 2017-09-03 ENCOUNTER — Ambulatory Visit: Payer: Self-pay | Admitting: Obstetrics and Gynecology

## 2017-09-13 DIAGNOSIS — Z1151 Encounter for screening for human papillomavirus (HPV): Secondary | ICD-10-CM | POA: Diagnosis not present

## 2017-09-13 DIAGNOSIS — Z6822 Body mass index (BMI) 22.0-22.9, adult: Secondary | ICD-10-CM | POA: Diagnosis not present

## 2017-09-13 DIAGNOSIS — Z01419 Encounter for gynecological examination (general) (routine) without abnormal findings: Secondary | ICD-10-CM | POA: Diagnosis not present

## 2017-09-29 ENCOUNTER — Ambulatory Visit: Payer: Self-pay | Admitting: Obstetrics and Gynecology

## 2017-10-01 DIAGNOSIS — Z1329 Encounter for screening for other suspected endocrine disorder: Secondary | ICD-10-CM | POA: Diagnosis not present

## 2017-10-01 DIAGNOSIS — Z130 Encounter for screening for diseases of the blood and blood-forming organs and certain disorders involving the immune mechanism: Secondary | ICD-10-CM | POA: Diagnosis not present

## 2017-10-01 DIAGNOSIS — D352 Benign neoplasm of pituitary gland: Secondary | ICD-10-CM | POA: Diagnosis not present

## 2017-10-01 DIAGNOSIS — Z131 Encounter for screening for diabetes mellitus: Secondary | ICD-10-CM | POA: Diagnosis not present

## 2017-10-01 DIAGNOSIS — Z13220 Encounter for screening for lipoid disorders: Secondary | ICD-10-CM | POA: Diagnosis not present

## 2017-10-01 DIAGNOSIS — Z0000 Encounter for general adult medical examination without abnormal findings: Secondary | ICD-10-CM | POA: Diagnosis not present

## 2017-11-01 DIAGNOSIS — Z1231 Encounter for screening mammogram for malignant neoplasm of breast: Secondary | ICD-10-CM | POA: Diagnosis not present

## 2017-11-01 DIAGNOSIS — Z9882 Breast implant status: Secondary | ICD-10-CM | POA: Diagnosis not present

## 2018-02-27 ENCOUNTER — Emergency Department (HOSPITAL_COMMUNITY): Payer: BLUE CROSS/BLUE SHIELD

## 2018-02-27 ENCOUNTER — Emergency Department (HOSPITAL_COMMUNITY): Payer: BLUE CROSS/BLUE SHIELD | Admitting: Anesthesiology

## 2018-02-27 ENCOUNTER — Encounter (HOSPITAL_COMMUNITY)
Admission: EM | Disposition: A | Discharge status: Home / Self Care | Payer: Self-pay | Source: Home / Self Care | Attending: General Surgery

## 2018-02-27 ENCOUNTER — Inpatient Hospital Stay (HOSPITAL_COMMUNITY)
Admission: EM | Admit: 2018-02-27 | Discharge: 2018-03-02 | DRG: 329 | Disposition: A | Discharge status: Home / Self Care | Payer: BLUE CROSS/BLUE SHIELD | Attending: General Surgery | Admitting: General Surgery

## 2018-02-27 ENCOUNTER — Other Ambulatory Visit: Payer: Self-pay

## 2018-02-27 DIAGNOSIS — Z8249 Family history of ischemic heart disease and other diseases of the circulatory system: Secondary | ICD-10-CM

## 2018-02-27 DIAGNOSIS — Z79890 Hormone replacement therapy (postmenopausal): Secondary | ICD-10-CM

## 2018-02-27 DIAGNOSIS — Z87891 Personal history of nicotine dependence: Secondary | ICD-10-CM | POA: Diagnosis not present

## 2018-02-27 DIAGNOSIS — M549 Dorsalgia, unspecified: Secondary | ICD-10-CM | POA: Diagnosis not present

## 2018-02-27 DIAGNOSIS — F418 Other specified anxiety disorders: Secondary | ICD-10-CM | POA: Diagnosis present

## 2018-02-27 DIAGNOSIS — Z833 Family history of diabetes mellitus: Secondary | ICD-10-CM

## 2018-02-27 DIAGNOSIS — K562 Volvulus: Secondary | ICD-10-CM

## 2018-02-27 DIAGNOSIS — K55049 Acute infarction of large intestine, extent unspecified: Principal | ICD-10-CM | POA: Diagnosis present

## 2018-02-27 DIAGNOSIS — K559 Vascular disorder of intestine, unspecified: Secondary | ICD-10-CM

## 2018-02-27 DIAGNOSIS — R112 Nausea with vomiting, unspecified: Secondary | ICD-10-CM | POA: Diagnosis not present

## 2018-02-27 DIAGNOSIS — R109 Unspecified abdominal pain: Secondary | ICD-10-CM | POA: Diagnosis not present

## 2018-02-27 DIAGNOSIS — Z79899 Other long term (current) drug therapy: Secondary | ICD-10-CM | POA: Diagnosis not present

## 2018-02-27 HISTORY — PX: LAPAROTOMY: SHX154

## 2018-02-27 HISTORY — PX: LAPAROSCOPIC RIGHT HEMI COLECTOMY: SHX5926

## 2018-02-27 LAB — COMPREHENSIVE METABOLIC PANEL
ALK PHOS: 36 U/L — AB (ref 38–126)
ALT: 21 U/L (ref 0–44)
AST: 23 U/L (ref 15–41)
Albumin: 4 g/dL (ref 3.5–5.0)
Anion gap: 6 (ref 5–15)
BILIRUBIN TOTAL: 0.3 mg/dL (ref 0.3–1.2)
BUN: 24 mg/dL — AB (ref 6–20)
CALCIUM: 9.2 mg/dL (ref 8.9–10.3)
CO2: 28 mmol/L (ref 22–32)
Chloride: 106 mmol/L (ref 98–111)
Creatinine, Ser: 0.67 mg/dL (ref 0.44–1.00)
GFR calc Af Amer: >60 mL/min (ref >60)
GFR calc non Af Amer: >60 mL/min (ref >60)
GLUCOSE: 179 mg/dL — AB (ref 70–99)
Potassium: 4 mmol/L (ref 3.5–5.1)
Sodium: 140 mmol/L (ref 135–145)
TOTAL PROTEIN: 6.2 g/dL — AB (ref 6.5–8.1)

## 2018-02-27 LAB — CBC
HCT: 40 % (ref 36.0–46.0)
Hemoglobin: 13.1 g/dL (ref 12.0–15.0)
MCH: 31.6 pg (ref 26.0–34.0)
MCHC: 32.8 g/dL (ref 30.0–36.0)
MCV: 96.6 fL (ref 80.0–100.0)
Platelets: 231 10*3/uL (ref 150–400)
RBC: 4.14 MIL/uL (ref 3.87–5.11)
RDW: 13.2 % (ref 11.5–15.5)
WBC: 14.7 10*3/uL — ABNORMAL HIGH (ref 4.0–10.5)
nRBC: 0 % (ref 0.0–0.2)

## 2018-02-27 LAB — LIPASE, BLOOD: Lipase: 30 U/L (ref 11–51)

## 2018-02-27 LAB — MRSA PCR SCREENING: MRSA by PCR: NEGATIVE

## 2018-02-27 SURGERY — LAPAROTOMY, EXPLORATORY
Anesthesia: General | Laterality: Right

## 2018-02-27 MED ORDER — FENTANYL CITRATE (PF) 100 MCG/2ML IJ SOLN
INTRAMUSCULAR | Status: DC | PRN
  Administered 2018-02-27 (×2): 50 ug via INTRAVENOUS

## 2018-02-27 MED ORDER — ONDANSETRON HCL 4 MG/2ML IJ SOLN
INTRAMUSCULAR | Status: DC | PRN
  Administered 2018-02-27: 4 mg via INTRAVENOUS

## 2018-02-27 MED ORDER — SODIUM CHLORIDE 0.9 % IV BOLUS
1000.0000 mL | Freq: Once | INTRAVENOUS | Status: AC
  Administered 2018-02-27: 1000 mL via INTRAVENOUS

## 2018-02-27 MED ORDER — SODIUM CHLORIDE 0.9% FLUSH
3.0000 mL | Freq: Once | INTRAVENOUS | Status: AC
  Administered 2018-02-27: 3 mL via INTRAVENOUS

## 2018-02-27 MED ORDER — SUGAMMADEX SODIUM 200 MG/2ML IV SOLN
INTRAVENOUS | Status: DC | PRN
  Administered 2018-02-27: 115.2 mg via INTRAVENOUS

## 2018-02-27 MED ORDER — AMPICILLIN-SULBACTAM SODIUM 3 (2-1) G IV SOLR: 3.0000 g | Freq: Once | INTRAVENOUS | Status: DC

## 2018-02-27 MED ORDER — SUGAMMADEX SODIUM 500 MG/5ML IV SOLN
INTRAVENOUS | Status: AC
  Filled 2018-02-27: qty 5

## 2018-02-27 MED ORDER — HYDROMORPHONE HCL 1 MG/ML IJ SOLN
1.0000 mg | Freq: Once | INTRAMUSCULAR | Status: AC
  Administered 2018-02-27: 1 mg via INTRAVENOUS
  Filled 2018-02-27: qty 1

## 2018-02-27 MED ORDER — HYDROMORPHONE HCL 1 MG/ML IJ SOLN
0.5000 mg | Freq: Once | INTRAMUSCULAR | Status: AC
  Administered 2018-02-27: 0.5 mg via INTRAVENOUS

## 2018-02-27 MED ORDER — DIPHENHYDRAMINE HCL 25 MG PO CAPS: 25.0000 mg | ORAL_CAPSULE | Freq: Four times a day (QID) | ORAL | Status: DC | PRN

## 2018-02-27 MED ORDER — ONDANSETRON 4 MG PO TBDP
4.0000 mg | ORAL_TABLET | Freq: Four times a day (QID) | ORAL | Status: DC | PRN
  Administered 2018-03-02: 4 mg via ORAL
  Filled 2018-02-27: qty 1

## 2018-02-27 MED ORDER — KETOROLAC TROMETHAMINE 30 MG/ML IJ SOLN
30.0000 mg | Freq: Four times a day (QID) | INTRAMUSCULAR | Status: DC | PRN
  Administered 2018-02-28: 30 mg via INTRAVENOUS
  Filled 2018-02-27: qty 1

## 2018-02-27 MED ORDER — DIPHENHYDRAMINE HCL 50 MG/ML IJ SOLN
25.0000 mg | Freq: Once | INTRAMUSCULAR | Status: AC
  Administered 2018-02-27: 25 mg via INTRAVENOUS
  Filled 2018-02-27: qty 1

## 2018-02-27 MED ORDER — METOCLOPRAMIDE HCL 5 MG/ML IJ SOLN
10.0000 mg | Freq: Once | INTRAMUSCULAR | Status: AC
  Administered 2018-02-27: 10 mg via INTRAVENOUS
  Filled 2018-02-27: qty 2

## 2018-02-27 MED ORDER — KETOROLAC TROMETHAMINE 30 MG/ML IJ SOLN
30.0000 mg | Freq: Four times a day (QID) | INTRAMUSCULAR | Status: AC
  Administered 2018-02-27: 30 mg via INTRAVENOUS
  Filled 2018-02-27: qty 1

## 2018-02-27 MED ORDER — ACETAMINOPHEN 650 MG RE SUPP: 650.0000 mg | Freq: Four times a day (QID) | RECTAL | Status: DC | PRN

## 2018-02-27 MED ORDER — SODIUM CHLORIDE 0.9 % IV SOLN
INTRAVENOUS | Status: DC | PRN
  Administered 2018-02-27: 2 g via INTRAVENOUS

## 2018-02-27 MED ORDER — HYDROCODONE-ACETAMINOPHEN 7.5-325 MG PO TABS: 1.0000 | ORAL_TABLET | Freq: Once | ORAL | Status: DC | PRN

## 2018-02-27 MED ORDER — POVIDONE-IODINE 10 % EX OINT
TOPICAL_OINTMENT | CUTANEOUS | Status: AC
  Filled 2018-02-27: qty 1

## 2018-02-27 MED ORDER — ONDANSETRON HCL 4 MG/2ML IJ SOLN
INTRAMUSCULAR | Status: AC
  Filled 2018-02-27: qty 2

## 2018-02-27 MED ORDER — ZOLPIDEM TARTRATE 5 MG PO TABS: 5.0000 mg | ORAL_TABLET | Freq: Every evening | ORAL | Status: DC | PRN

## 2018-02-27 MED ORDER — SODIUM CHLORIDE 0.9 % IV SOLN
2.0000 g | Freq: Once | INTRAVENOUS | Status: AC
  Administered 2018-02-27: 2 g via INTRAVENOUS
  Filled 2018-02-27 (×2): qty 2

## 2018-02-27 MED ORDER — ONDANSETRON HCL 4 MG/2ML IJ SOLN
INTRAMUSCULAR | Status: AC
  Administered 2018-02-27: 4 mg via INTRAVENOUS
  Filled 2018-02-27: qty 2

## 2018-02-27 MED ORDER — LACTATED RINGERS IV SOLN: INTRAVENOUS | Status: DC

## 2018-02-27 MED ORDER — MIDAZOLAM HCL 2 MG/2ML IJ SOLN: 0.5000 mg | Freq: Once | INTRAMUSCULAR | Status: DC | PRN

## 2018-02-27 MED ORDER — CHLORHEXIDINE GLUCONATE CLOTH 2 % EX PADS: 6.0000 | MEDICATED_PAD | Freq: Once | CUTANEOUS | Status: DC

## 2018-02-27 MED ORDER — HYDROMORPHONE HCL 1 MG/ML IJ SOLN
INTRAMUSCULAR | Status: AC
  Filled 2018-02-27: qty 1

## 2018-02-27 MED ORDER — ALVIMOPAN 12 MG PO CAPS
12.0000 mg | ORAL_CAPSULE | Freq: Two times a day (BID) | ORAL | Status: DC
  Administered 2018-03-01: 12 mg via ORAL
  Filled 2018-02-27: qty 1

## 2018-02-27 MED ORDER — FENTANYL CITRATE (PF) 100 MCG/2ML IJ SOLN: 50.0000 ug | INTRAMUSCULAR | Status: DC | PRN

## 2018-02-27 MED ORDER — SUCCINYLCHOLINE CHLORIDE 20 MG/ML IJ SOLN
INTRAMUSCULAR | Status: DC | PRN
  Administered 2018-02-27: 100 mg via INTRAVENOUS

## 2018-02-27 MED ORDER — KETOROLAC TROMETHAMINE 30 MG/ML IJ SOLN
INTRAMUSCULAR | Status: AC
  Filled 2018-02-27: qty 1

## 2018-02-27 MED ORDER — PROMETHAZINE HCL 25 MG/ML IJ SOLN: 6.2500 mg | INTRAMUSCULAR | Status: DC | PRN

## 2018-02-27 MED ORDER — PROPOFOL 10 MG/ML IV BOLUS
INTRAVENOUS | Status: AC
  Filled 2018-02-27: qty 20

## 2018-02-27 MED ORDER — ONDANSETRON HCL 4 MG/2ML IJ SOLN
4.0000 mg | Freq: Four times a day (QID) | INTRAMUSCULAR | Status: DC | PRN
  Administered 2018-02-28 – 2018-03-01 (×3): 4 mg via INTRAVENOUS
  Filled 2018-02-27 (×3): qty 2

## 2018-02-27 MED ORDER — PROPOFOL 10 MG/ML IV BOLUS
INTRAVENOUS | Status: DC | PRN
  Administered 2018-02-27: 120 mg via INTRAVENOUS

## 2018-02-27 MED ORDER — ALVIMOPAN 12 MG PO CAPS
12.0000 mg | ORAL_CAPSULE | ORAL | Status: AC
  Administered 2018-02-27: 12 mg via ORAL
  Filled 2018-02-27: qty 1

## 2018-02-27 MED ORDER — MORPHINE SULFATE (PF) 4 MG/ML IV SOLN
4.0000 mg | Freq: Once | INTRAVENOUS | Status: AC
  Administered 2018-02-27: 4 mg via INTRAVENOUS
  Filled 2018-02-27: qty 1

## 2018-02-27 MED ORDER — LORAZEPAM 2 MG/ML IJ SOLN
1.0000 mg | INTRAMUSCULAR | Status: DC | PRN
  Administered 2018-03-01 (×2): 1 mg via INTRAVENOUS
  Filled 2018-02-27 (×2): qty 1

## 2018-02-27 MED ORDER — SUCCINYLCHOLINE CHLORIDE 200 MG/10ML IV SOSY
PREFILLED_SYRINGE | INTRAVENOUS | Status: AC
  Filled 2018-02-27: qty 10

## 2018-02-27 MED ORDER — BUPROPION HCL ER (XL) 300 MG PO TB24
300.0000 mg | ORAL_TABLET | Freq: Every day | ORAL | Status: DC
  Filled 2018-02-27 (×2): qty 1
  Filled 2018-02-27 (×2): qty 2

## 2018-02-27 MED ORDER — SIMETHICONE 80 MG PO CHEW
40.0000 mg | CHEWABLE_TABLET | Freq: Four times a day (QID) | ORAL | Status: DC | PRN
  Administered 2018-02-28: 40 mg via ORAL
  Filled 2018-02-27: qty 1

## 2018-02-27 MED ORDER — LACTATED RINGERS IV SOLN
INTRAVENOUS | Status: DC
  Administered 2018-02-27 – 2018-03-01 (×4): via INTRAVENOUS

## 2018-02-27 MED ORDER — SODIUM CHLORIDE 0.9 % IV SOLN: 3.0000 g | Freq: Once | INTRAVENOUS | Status: DC

## 2018-02-27 MED ORDER — ESTRADIOL 1 MG PO TABS
1.0000 mg | ORAL_TABLET | Freq: Every day | ORAL | Status: DC
  Filled 2018-02-27 (×6): qty 1

## 2018-02-27 MED ORDER — SODIUM CHLORIDE 0.9 % IV SOLN
INTRAVENOUS | Status: AC
  Filled 2018-02-27: qty 3

## 2018-02-27 MED ORDER — HYDROMORPHONE HCL 2 MG/ML IJ SOLN
INTRAMUSCULAR | Status: AC
  Filled 2018-02-27: qty 1

## 2018-02-27 MED ORDER — DIPHENHYDRAMINE HCL 50 MG/ML IJ SOLN: 25.0000 mg | Freq: Four times a day (QID) | INTRAMUSCULAR | Status: DC | PRN

## 2018-02-27 MED ORDER — ACETAMINOPHEN 325 MG PO TABS
650.0000 mg | ORAL_TABLET | Freq: Four times a day (QID) | ORAL | Status: DC | PRN
  Filled 2018-02-27: qty 2

## 2018-02-27 MED ORDER — SODIUM CHLORIDE 0.9 % IV SOLN
2.0000 g | Freq: Two times a day (BID) | INTRAVENOUS | Status: DC
  Administered 2018-02-27 – 2018-03-02 (×6): 2 g via INTRAVENOUS
  Filled 2018-02-27 (×8): qty 2

## 2018-02-27 MED ORDER — ROCURONIUM BROMIDE 100 MG/10ML IV SOLN
INTRAVENOUS | Status: DC | PRN
  Administered 2018-02-27: 10 mg via INTRAVENOUS
  Administered 2018-02-27: 30 mg via INTRAVENOUS

## 2018-02-27 MED ORDER — HYDROMORPHONE HCL 1 MG/ML IJ SOLN: 0.2500 mg | INTRAMUSCULAR | Status: DC | PRN

## 2018-02-27 MED ORDER — BUPIVACAINE LIPOSOME 1.3 % IJ SUSP
INTRAMUSCULAR | Status: AC
  Filled 2018-02-27: qty 20

## 2018-02-27 MED ORDER — ROCURONIUM BROMIDE 10 MG/ML (PF) SYRINGE
PREFILLED_SYRINGE | INTRAVENOUS | Status: AC
  Filled 2018-02-27: qty 10

## 2018-02-27 MED ORDER — ENOXAPARIN SODIUM 40 MG/0.4ML ~~LOC~~ SOLN
40.0000 mg | SUBCUTANEOUS | Status: DC
  Administered 2018-02-28: 40 mg via SUBCUTANEOUS
  Filled 2018-02-27 (×2): qty 0.4

## 2018-02-27 MED ORDER — FENTANYL CITRATE (PF) 250 MCG/5ML IJ SOLN
INTRAMUSCULAR | Status: AC
  Filled 2018-02-27: qty 5

## 2018-02-27 MED ORDER — SODIUM CHLORIDE 0.9 % IR SOLN
Status: DC | PRN
  Administered 2018-02-27 (×3): 1000 mL

## 2018-02-27 MED ORDER — LACTATED RINGERS IV SOLN
INTRAVENOUS | Status: DC | PRN
  Administered 2018-02-27 (×2): via INTRAVENOUS

## 2018-02-27 MED ORDER — KETOROLAC TROMETHAMINE 30 MG/ML IJ SOLN
30.0000 mg | Freq: Once | INTRAMUSCULAR | Status: AC
  Administered 2018-02-27: 30 mg via INTRAVENOUS
  Filled 2018-02-27: qty 1

## 2018-02-27 MED ORDER — SODIUM CHLORIDE 0.9 % IV SOLN: 3.0000 g | Freq: Four times a day (QID) | INTRAVENOUS | Status: DC

## 2018-02-27 MED ORDER — OXYCODONE-ACETAMINOPHEN 5-325 MG PO TABS
1.0000 | ORAL_TABLET | ORAL | Status: DC | PRN
  Administered 2018-02-27 – 2018-03-02 (×6): 2 via ORAL
  Filled 2018-02-27: qty 2
  Filled 2018-02-27: qty 1
  Filled 2018-02-27 (×5): qty 2

## 2018-02-27 SURGICAL SUPPLY — 71 items
APPLIER CLIP 11 MED OPEN (CLIP)
APPLIER CLIP 13 LRG OPEN (CLIP)
APR CLP LRG 13 20 CLIP (CLIP)
APR CLP MED 11 20 MLT OPN (CLIP)
BARRIER SKIN 2 3/4 (OSTOMY) IMPLANT
BARRIER SKIN OD2.25 2 3/4 FLNG (OSTOMY) IMPLANT
BRR SKN FLT 2.75X2.25 2 PC (OSTOMY)
CELLS DAT CNTRL 66122 CELL SVR (MISCELLANEOUS) ×2 IMPLANT
CHLORAPREP W/TINT 26ML (MISCELLANEOUS) ×3 IMPLANT
CLAMP POUCH DRAINAGE QUIET (OSTOMY) IMPLANT
CLIP APPLIE 11 MED OPEN (CLIP) IMPLANT
CLIP APPLIE 13 LRG OPEN (CLIP) IMPLANT
CLOTH BEACON ORANGE TIMEOUT ST (SAFETY) ×3 IMPLANT
COVER LIGHT HANDLE STERIS (MISCELLANEOUS) ×6 IMPLANT
DRAPE WARM FLUID 44X44 (DRAPE) ×3 IMPLANT
DRSG OPSITE POSTOP 4X10 (GAUZE/BANDAGES/DRESSINGS) ×3 IMPLANT
DRSG OPSITE POSTOP 4X8 (GAUZE/BANDAGES/DRESSINGS) ×1 IMPLANT
ELECT BLADE 6 FLAT ULTRCLN (ELECTRODE) IMPLANT
ELECT REM PT RETURN 9FT ADLT (ELECTROSURGICAL) ×3
ELECTRODE REM PT RTRN 9FT ADLT (ELECTROSURGICAL) ×2 IMPLANT
GAUZE SPONGE 4X4 12PLY STRL (GAUZE/BANDAGES/DRESSINGS) ×3 IMPLANT
GLOVE BIOGEL M 6.5 STRL (GLOVE) ×1 IMPLANT
GLOVE BIOGEL M 7.0 STRL (GLOVE) ×1 IMPLANT
GLOVE BIOGEL M STRL SZ7.5 (GLOVE) ×1 IMPLANT
GLOVE BIOGEL PI IND STRL 6.5 (GLOVE) IMPLANT
GLOVE BIOGEL PI IND STRL 7.0 (GLOVE) ×4 IMPLANT
GLOVE BIOGEL PI IND STRL 7.5 (GLOVE) IMPLANT
GLOVE BIOGEL PI INDICATOR 6.5 (GLOVE) ×1
GLOVE BIOGEL PI INDICATOR 7.0 (GLOVE) ×2
GLOVE BIOGEL PI INDICATOR 7.5 (GLOVE) ×1
GLOVE SURG SS PI 7.5 STRL IVOR (GLOVE) ×3 IMPLANT
GOWN STRL REUS W/TWL LRG LVL3 (GOWN DISPOSABLE) ×9 IMPLANT
HANDLE SUCTION POOLE (INSTRUMENTS) IMPLANT
INST SET MAJOR GENERAL (KITS) ×3 IMPLANT
KIT REMOVER STAPLE SKIN (MISCELLANEOUS) IMPLANT
KIT TURNOVER KIT A (KITS) ×3 IMPLANT
LIGASURE IMPACT 36 18CM CVD LR (INSTRUMENTS) ×3 IMPLANT
MANIFOLD NEPTUNE II (INSTRUMENTS) ×3 IMPLANT
NDL HYPO 18GX1.5 BLUNT FILL (NEEDLE) ×2 IMPLANT
NEEDLE HYPO 18GX1.5 BLUNT FILL (NEEDLE) ×3 IMPLANT
NS IRRIG 1000ML POUR BTL (IV SOLUTION) ×7 IMPLANT
PACK ABDOMINAL MAJOR (CUSTOM PROCEDURE TRAY) ×3 IMPLANT
PAD ARMBOARD 7.5X6 YLW CONV (MISCELLANEOUS) ×3 IMPLANT
POUCH OSTOMY 2 3/4  H 3804 (WOUND CARE)
POUCH OSTOMY 2 3/4 H 3804 (WOUND CARE)
POUCH OSTOMY 2 PC DRNBL 2.75 (WOUND CARE) IMPLANT
RELOAD LINEAR CUT PROX 55 BLUE (ENDOMECHANICALS) IMPLANT
RELOAD PROXIMATE 75MM BLUE (ENDOMECHANICALS) ×6 IMPLANT
RELOAD STAPLE 55 3.8 BLU REG (ENDOMECHANICALS) IMPLANT
RELOAD STAPLE 75 3.8 BLU REG (ENDOMECHANICALS) IMPLANT
RETRACTOR WND ALEXIS 18 MED (MISCELLANEOUS) ×2 IMPLANT
RETRACTOR WND ALEXIS 25 LRG (MISCELLANEOUS) IMPLANT
RTRCTR WOUND ALEXIS 18CM MED (MISCELLANEOUS) ×3
RTRCTR WOUND ALEXIS 25CM LRG (MISCELLANEOUS)
SET BASIN LINEN APH (SET/KITS/TRAYS/PACK) ×3 IMPLANT
SPONGE LAP 18X18 RF (DISPOSABLE) ×3 IMPLANT
STAPLER GUN LINEAR PROX 60 (STAPLE) ×1 IMPLANT
STAPLER PROXIMATE 55 BLUE (STAPLE) IMPLANT
STAPLER PROXIMATE 75MM BLUE (STAPLE) ×1 IMPLANT
STAPLER VISISTAT (STAPLE) ×3 IMPLANT
SUCTION POOLE HANDLE (INSTRUMENTS) ×3
SUT CHROMIC 0 SH (SUTURE) IMPLANT
SUT CHROMIC 2 0 SH (SUTURE) ×2 IMPLANT
SUT CHROMIC 3 0 SH 27 (SUTURE) IMPLANT
SUT PDS AB 0 CTX 60 (SUTURE) IMPLANT
SUT PROLENE 2 0 SH 30 (SUTURE) IMPLANT
SUT SILK 2 0 (SUTURE)
SUT SILK 2-0 18XBRD TIE 12 (SUTURE) IMPLANT
SUT SILK 3 0 SH CR/8 (SUTURE) ×1 IMPLANT
SYR 20CC LL (SYRINGE) ×3 IMPLANT
TRAY FOLEY MTR SLVR 16FR STAT (SET/KITS/TRAYS/PACK) ×3 IMPLANT

## 2018-02-27 NOTE — Op Note (Signed)
Patient:  Catherine Short  DOB:  06/09/1960  MRN:  [id number]   Preop Diagnosis: Cecal volvulus  Postop Diagnosis: Same, involvement of the descending colon  Procedure: Right hemicolectomy  Surgeon: [name] Signs, MD  Anes: General endotracheal  Indications: Patient is a 58 year old white female who presented emergency room with abdominal pain and distention.  CT scan of the abdomen reveals cecal volvulus.  The patient now comes to the operating room for exploratory laparotomy and probable bowel resection.  The risks and benefits of the procedure including bleeding, infection, anastomotic leak, and the possibility of a bowel resection were fully explained to the patient, who gave informed consent.  Procedure note: The patient was placed in supine position.  After induction of general endotracheal anesthesia, the abdomen was prepped and draped using the usual sterile technique with ChloraPrep.  Surgical site confirmation was performed.  A midline incision was made from just above the umbilicus to below the umbilicus.  The peritoneal cavity was entered into without difficulty.  Serosanguineous fluid was found, but no evidence of perforation was present.  The bowel was eviscerated in order to provide exposure.  It appeared that the volvulus occurred around the right colic artery.  The terminal ileum, ascending colon, and proximal transverse colon were all necrotic.  There was also omentum across the base of the right colic artery that appeared to constrict the mesentery.  A GIA-75 stapler was placed across the distal small bowel and fired.  This was likewise done across the mid transverse colon, just proximal to the middle colic artery.  The mesentery was then divided using the LigaSure.  The specimen was then removed from the operative field.  Care was taken to avoid the duodenum.  A side-to-side ileocolic anastomosis was then performed using a GIA-75 stapler.  The enterotomy was closed using a TA 60  stapler.  The staple line was bolstered using 3-0 silk sutures.  A widely patent anastomosis was present.  Surrounding greater omentum was then placed over the anastomosis and secured into place using a 3-0 silk suture.  The mesenteric defect was closed using a 2-0 Chromic Gut running suture.  The bowel was then returned into the abdominal cavity in an orderly fashion.  The abdominal cavity was then copiously irrigated with normal saline.  All fluid was evacuated from the abdominal cavity.  All operating personnel then changed her gown and gloves.  A new set up was used for closure.  The fascia was reapproximated using an 0 PDS running suture.  Subcutaneous layer was irrigated with normal saline.  The wound was injected with Exparel.  The skin was closed using staples.  Betadine ointment and a dry sterile dressing was applied.  All tape and needle counts were correct at the end of the procedure.  The patient was extubated in the operating room and transferred to PACU in stable condition.  Complications: None  EBL: 50 cc  Specimen: Ascending colon

## 2018-02-27 NOTE — ED Notes (Signed)
Patient transported to CT 

## 2018-02-27 NOTE — Transfer of Care (Addendum)
Immediate Anesthesia Transfer of Care Note  Patient: Catherine Short  Procedure(s) Performed: EXPLORATORY LAPAROTOMY (N/A )  Patient Location: PACU  Anesthesia Type:General  Level of Consciousness: awake, alert  and oriented  Airway & Oxygen Therapy: Patient Spontanous Breathing  Post-op Assessment: Report given to RN  Post vital signs: Reviewed  Last Vitals:  Vitals Value Taken Time  BP 118/72 02/27/2018  9:39 AM  Temp 97.9    Pulse 95 02/27/2018  9:42 AM  Resp 21 02/27/2018  9:42 AM  SpO2 100 % 02/27/2018  9:42 AM  Vitals shown include unvalidated device data.  Last Pain:  Vitals:   02/27/18 0110  PainSc: 10-Worst pain ever         Complications: No apparent anesthesia complications

## 2018-02-27 NOTE — Anesthesia Postprocedure Evaluation (Signed)
Anesthesia Post Note  Patient: Catherine Short  Procedure(s) Performed: EXPLORATORY LAPAROTOMY (N/A ) LAPAROSCOPIC RIGHT HEMI COLECTOMY (Right )  Patient location during evaluation: PACU Anesthesia Type: General Level of consciousness: awake Pain management: pain level controlled Vital Signs Assessment: post-procedure vital signs reviewed and stable Respiratory status: spontaneous breathing and nonlabored ventilation Cardiovascular status: blood pressure returned to baseline and stable Postop Assessment: no headache and no backache Anesthetic complications: no     Last Vitals:  Vitals:   02/27/18 0730 02/27/18 0745  BP: 115/87   Pulse: 69 79  Resp: 11 14  Temp:    SpO2: 98% 98%    Last Pain:  Vitals:   02/27/18 0110  PainSc: 10-Worst pain ever                 Lenice Llamas

## 2018-02-27 NOTE — Anesthesia Procedure Notes (Signed)
Procedure Name: Intubation Date/Time: 02/27/2018 8:12 AM Performed by: Lenice Llamas, MD Pre-anesthesia Checklist: Patient identified, Patient being monitored, Timeout performed, Emergency Drugs available and Suction available Patient Re-evaluated:Patient Re-evaluated prior to induction Oxygen Delivery Method: Circle System Utilized Preoxygenation: Pre-oxygenation with 100% oxygen Induction Type: IV induction, Rapid sequence and Cricoid Pressure applied Laryngoscope Size: Mac and 3 Grade View: Grade I Tube type: Oral Tube size: 6.0 mm Number of attempts: 1 Airway Equipment and Method: Stylet Placement Confirmation: ETT inserted through vocal cords under direct vision,  positive ETCO2 and breath sounds checked- equal and bilateral Secured at: 18 cm Tube secured with: Tape Dental Injury: Teeth and Oropharynx as per pre-operative assessment

## 2018-02-27 NOTE — ED Triage Notes (Signed)
Patient having right lower quadrant pain and actively vomiting. Patient states pain is severe and started last night.

## 2018-02-27 NOTE — ED Notes (Signed)
Patient given 4 mg's of Zofran for active vomiting.

## 2018-02-27 NOTE — Anesthesia Preprocedure Evaluation (Signed)
Anesthesia Evaluation    History of Anesthesia Complications (+) AWARENESS UNDER ANESTHESIA  Airway Mallampati: I  TM Distance: >3 FB     Dental  (+) Teeth Intact   Pulmonary    Pulmonary exam normal        Cardiovascular Exercise Tolerance: Good Normal cardiovascular examI     Neuro/Psych Anxiety Depression  Neuromuscular disease    GI/Hepatic GERD  ,Denies GERD or current meds   Endo/Other    Renal/GU      Musculoskeletal  (+) Arthritis , Osteoarthritis,    Abdominal   Peds  Hematology   Anesthesia Other Findings   Reproductive/Obstetrics                             Anesthesia Physical Anesthesia Plan  ASA: II and emergent  Anesthesia Plan: General   Post-op Pain Management:    Induction:   PONV Risk Score and Plan:   Airway Management Planned: Oral ETT  Additional Equipment:   Intra-op Plan:   Post-operative Plan: Extubation in OR  Informed Consent:   Plan Discussed with:   Anesthesia Plan Comments:         Anesthesia Quick Evaluation

## 2018-02-27 NOTE — H&P (Signed)
Catherine Short    Chief Complaint: Abdominal pain HPI: Patient is a 58 year old who presented to the emergency room with worsening abdominal pain and distention.  CT scan of the abdomen revealed a cecal volvulus.  Patient has never had abdominal surgery.  She states the pain started yesterday afternoon.  She has a chronic history of constipation.  Her pain is 7 out of 10.  She denies any fever or chills.  She does have some nausea.  Past Medical History:  Diagnosis Date  . Anxiety   . Chronic constipation   . Colitis   . Depression   . Fibroid   . H/O cold sores   . Hormone replacement therapy (HRT) 11/14/2014  . Hot flashes 11/14/2014  . Increased endometrial stripe thickness 05/30/2015   Will get biopsy  . Insomnia   . Liver cyst   . Menopause 11/14/2014  . Moody 05/23/2015  . Nerves 11/14/2014  . Night sweats 11/14/2014  . PMB (postmenopausal bleeding) 05/23/2015  . Prolactinoma ([place])   . Rectocele 05/23/2015    Past Surgical History:  Procedure Laterality Date  . AUGMENTATION MAMMAPLASTY  2000   saline implants  . BREAST ENHANCEMENT SURGERY  1996  . CARPAL TUNNEL RELEASE    . TONSILECTOMY, ADENOIDECTOMY, BILATERAL MYRINGOTOMY AND TUBES      Family History  Problem Relation Age of Onset  . Other Mother        digestive type issues  . Diabetes Mother   . Hypertension Mother   . Heart attack Father   . Heart disease Father   . Other Brother        MVA  . Cancer Maternal Grandmother        uterine  . Emphysema Maternal Grandfather   . Alcohol abuse Brother   . Cancer Paternal Grandfather        colon   Social History:  reports that she has quit smoking. Her smoking use included cigarettes. She quit after 6.00 years of use. She has never used smokeless tobacco. She reports current alcohol use of about 2.0 standard drinks of alcohol per week. She reports that she does not use drugs.  Allergies:  Allergies  Allergen Reactions  . Cyclobenzaprine Hcl     REACTION:  disoriented-Flexeril    (Not in a hospital admission)   Results for orders placed or performed during the hospital encounter of 02/27/18 (from the past 48 hour(s))  Lipase, blood     Status: None   Collection Time: 02/27/18  1:10 AM  Result Value Ref Range   Lipase 30 11 - 51 U/L    Comment: Performed at [facility], [address], [place], [place] [address]  Comprehensive metabolic panel     Status: Abnormal   Collection Time: 02/27/18  1:10 AM  Result Value Ref Range   Sodium 140 135 - 145 mmol/L   Potassium 4.0 3.5 - 5.1 mmol/L   Chloride 106 98 - 111 mmol/L   CO2 28 22 - 32 mmol/L   Glucose, Bld 179 (H) 70 - 99 mg/dL   BUN 24 (H) 6 - 20 mg/dL   Creatinine, Ser 0.67 0.44 - 1.00 mg/dL   Calcium 9.2 8.9 - 10.3 mg/dL   Total Protein 6.2 (L) 6.5 - 8.1 g/dL   Albumin 4.0 3.5 - 5.0 g/dL   AST 23 15 - 41 U/L   ALT 21 0 - 44 U/L   Alkaline Phosphatase 36 (L) 38 - 126 U/L   Total  Bilirubin 0.3 0.3 - 1.2 mg/dL   GFR calc non Af Amer >60 >60 mL/min   GFR calc Af Amer >60 >60 mL/min   Anion gap 6 5 - 15    Comment: Performed at [facility], [address] [place], [place] [address]  CBC     Status: Abnormal   Collection Time: 02/27/18  1:10 AM  Result Value Ref Range   WBC 14.7 (H) 4.0 - 10.5 K/uL   RBC 4.14 3.87 - 5.11 MIL/uL   Hemoglobin 13.1 12.0 - 15.0 g/dL   HCT 40.0 36.0 - 46.0 %   MCV 96.6 80.0 - 100.0 fL   MCH 31.6 26.0 - 34.0 pg   MCHC 32.8 30.0 - 36.0 g/dL   RDW 13.2 11.5 - 15.5 %   Platelets 231 150 - 400 K/uL   nRBC 0.0 0.0 - 0.2 %    Comment: Performed at [facility], [address] [place], [place] [address]   Ct Renal Joaquim Lai Study  Result Date: 02/27/2018 CLINICAL DATA:  58 year old female with abdominal pain and vomiting. EXAM: CT ABDOMEN AND PELVIS WITHOUT CONTRAST TECHNIQUE: Multidetector CT imaging of the abdomen and pelvis was performed following the standard protocol without IV contrast. COMPARISON:  CT of the abdomen pelvis dated 11/15/2015 FINDINGS:  Evaluation of this exam is limited in the absence of intravenous contrast. Lower chest: The visualized lung bases are clear. There is mild cardiomegaly. No intra-abdominal free air. Diffuse mesenteric edema and small ascites. Hepatobiliary: A 1 cm hypodense lesion in the right lobe of the liver is not characterized but appears similar to prior CT most consistent with a cyst. The liver is otherwise unremarkable. No intrahepatic biliary ductal dilatation. The gallbladder is physiologically distended and appears unremarkable. Pancreas: Unremarkable. No pancreatic ductal dilatation or surrounding inflammatory changes. Spleen: Normal in size without focal abnormality. Adrenals/Urinary Tract: The adrenal glands are unremarkable. The kidneys and urinary bladder appear unremarkable as well. Stomach/Bowel: Evaluation of the bowel is limited in the absence of oral contrast. The stomach is distended. There is twisting of the bowel in the right hemiabdomen likely around the axis of the proximal transverse colon and the terminal ileum. There is severe edema and inflammatory changes of the cecum and ascending colon. The cecum is distended and appears to be in the left hemiabdomen. 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 Clinical correlation and surgical consult is advised. No definite pneumatosis identified at this time however, the severity of inflammatory changes of the cecum and ascending colon may suggest a degree of ischemia. There is associated obstruction of the cecum/ascending colon secondary to the twist which appears to have caused a closed loop obstruction. The distal colon is decompressed. The appendix is not identified with certainty. Vascular/Lymphatic: The abdominal aorta and IVC are grossly unremarkable on this noncontrast CT. No portal venous gas. There is no adenopathy. Reproductive: The uterus and ovaries are grossly  unremarkable. Other: None Musculoskeletal: Partially visualized breast implants. L2-L3 degenerative changes with grade 1 retrolisthesis. No acute osseous pathology. IMPRESSION: Findings most consistent with a cecal volvulus versus twisting of the cecum and ascending colon secondary to an internal hernia. There is associated closed loop obstruction with severe edema and findings concerning for early bowel ischemia. Clinical correlation and surgical consult is advised. No free air or portal venous gas identified at this time. These results were called by telephone at the time of interpretation on 02/27/2018 at 3:38  am to Dr. Delora Fuel , who verbally acknowledged these results. Electronically Signed   By: Anner Crete M.D.   On: 02/27/2018 03:40    Review of Systems  Constitutional: Positive for malaise/fatigue.  HENT: Negative.   Eyes: Negative.   Respiratory: Negative.   Cardiovascular: Negative.   Gastrointestinal: Negative.   Genitourinary: Negative.   Musculoskeletal: Negative.   Skin: Negative.   Neurological: Negative.   Endo/Heme/Allergies: Negative.   Psychiatric/Behavioral: Negative.     Blood pressure 107/68, pulse 64, temperature 98.1 F (36.7 C), resp. rate 11, height 5\' 3"  (1.6 m), weight 57.6 kg, last menstrual period 07/05/2016, SpO2 100 %. Physical Exam  Vitals reviewed. Constitutional: She is oriented to person, place, and time. She appears well-developed and well-nourished. No distress.  HENT:  Head: Normocephalic and atraumatic.  Cardiovascular: Normal rate, regular rhythm and normal heart sounds. Exam reveals no gallop and no friction rub.  No murmur heard. Respiratory: Effort normal and breath sounds normal. No respiratory distress. She has no wheezes. She has no rales.  GI: Soft. Bowel sounds are normal. She exhibits distension. There is abdominal tenderness. There is no rebound and no guarding.  No rigidity noted.  Bowel sounds present.  Neurological: She is  alert and oriented to person, place, and time.  Skin: Skin is warm and dry.    CT scan images personally reviewed Assessment/Plan Impression: Cecal volvulus Plan: Patient be taken to the operating room for exploratory laparotomy, possible bowel resection.  The risks and benefits of the procedure including bleeding, infection, and the possibility of anastomotic leak were fully explained to the patient, who gave informed consent.  [name] Signs, MD 02/27/2018, 7:16 AM

## 2018-02-27 NOTE — ED Provider Notes (Signed)
[facility] EMERGENCY DEPARTMENT Provider Note   CSN: [id number] Arrival date & time: 02/27/18  4098    History   Chief Complaint Chief Complaint  Patient presents with  . Abdominal Pain    right lower quadrant    HPI Catherine Short is a 58 y.o. female.   The history is provided by the patient.  She has history of prolactinoma and comes in with right lower quadrant pain which started this afternoon and has been getting worse through the day.  There is some radiation to the back.  There is associated nausea and vomiting.  Pain is severe and she rates it a 10/10.  She has had chills and sweats but no fever.  She denies any urinary difficulty or diarrhea.  She is never had pain like this before.  She has not done anything to help it at home.  Nothing makes it better, nothing makes it worse.  Past Medical History:  Diagnosis Date  . Anxiety   . Chronic constipation   . Colitis   . Depression   . Fibroid   . H/O cold sores   . Hormone replacement therapy (HRT) 11/14/2014  . Hot flashes 11/14/2014  . Increased endometrial stripe thickness 05/30/2015   Will get biopsy  . Insomnia   . Liver cyst   . Menopause 11/14/2014  . Moody 05/23/2015  . Nerves 11/14/2014  . Night sweats 11/14/2014  . PMB (postmenopausal bleeding) 05/23/2015  . Prolactinoma ([place])   . Rectocele 05/23/2015    Patient Active Problem List   Diagnosis Date Noted  . Increased endometrial stripe thickness 05/30/2015  . Moody 05/23/2015  . PMB (postmenopausal bleeding) 05/23/2015  . Rectocele 05/23/2015  . Menopause 11/14/2014  . Hot flashes 11/14/2014  . Night sweats 11/14/2014  . Nerves 11/14/2014  . Hormone replacement therapy (HRT) 11/14/2014  . Unspecified constipation 11/14/2012  . Tennis elbow 10/30/2010  . Rotator cuff syndrome of left shoulder 10/30/2010  . Muscle spasms of neck 10/30/2010  . GERD 11/11/2009  . Microscopic colitis 11/11/2009    Past Surgical History:  Procedure Laterality Date   . AUGMENTATION MAMMAPLASTY  2000   saline implants  . BREAST ENHANCEMENT SURGERY  1996  . CARPAL TUNNEL RELEASE    . TONSILECTOMY, ADENOIDECTOMY, BILATERAL MYRINGOTOMY AND TUBES       OB History    Gravida  2   Para  1   Term      Preterm      AB  1   Living  1     SAB  1   TAB      Ectopic      Multiple      Live Births               Home Medications    Prior to Admission medications   Medication Sig Start Date End Date Taking? Authorizing Provider  BIOTIN PO Take 10,000 mg by mouth daily.    [provider]  Borage Oil-GLA-Linoleic Acid (BORAGE PO) Take 1,300 mg by mouth daily.    [provider]  buPROPion (WELLBUTRIN XL) 300 MG 24 hr tablet TAKE 1 TABLET DAILY BY  MOUTH. 07/13/17   Nunzio Cobbs, MD  Calcium-Magnesium-Vitamin D (CALCIUM MAGNESIUM PO) Take by mouth daily.    [provider]  Cholecalciferol (VITAMIN D-3 PO) Take by mouth daily.    [provider]  COLLAGEN PO Take by mouth daily.    [provider]  diclofenac (CATAFLAM) 50 MG tablet Take 50 mg by mouth 3 (three) times daily. Patient states taking 75mg  prn    [provider]  estradiol (ESTRACE) 1 MG tablet Take 1 tablet (1 mg total) by mouth daily. 07/13/17   Nunzio Cobbs, MD  GLUTATHIONE PO Take 500 mg by mouth daily.    [provider]  montelukast (SINGULAIR) 10 MG tablet Take 1 tablet (10 mg total) by mouth at bedtime. 12/10/14   Estill Dooms, NP  Naproxen Sodium (ALEVE PO) Take by mouth daily as needed.     [provider]  Omega-3 Fatty Acids (FISH OIL PO) Take by mouth. DHA and EFA-daily    [provider]  progesterone (PROMETRIUM) 100 MG capsule Take 1 capsule (100 mg total) daily by mouth. 11/18/16   Nunzio Cobbs, MD  vitamin E 1000 UNIT capsule Take 1,000 Units by mouth daily.    [provider]  zolpidem (AMBIEN) 10 MG tablet Take 10 mg by mouth at  bedtime as needed for sleep.    [provider]    Family History Family History  Problem Relation Age of Onset  . Other Mother        digestive type issues  . Diabetes Mother   . Hypertension Mother   . Heart attack Father   . Heart disease Father   . Other Brother        MVA  . Cancer Maternal Grandmother        uterine  . Emphysema Maternal Grandfather   . Alcohol abuse Brother   . Cancer Paternal Grandfather        colon    Social History Social History   Tobacco Use  . Smoking status: Former Smoker    Years: 6.00    Types: Cigarettes  . Smokeless tobacco: Never Used  Substance Use Topics  . Alcohol use: Yes    Alcohol/week: 2.0 standard drinks    Types: 2 Glasses of wine per week    Comment: twice a week  . Drug use: No     Allergies   Cyclobenzaprine hcl   Review of Systems Review of Systems  All other systems reviewed and are negative.    Physical Exam Updated Vital Signs BP 108/71 (BP Location: Left Arm)   Pulse (!) 53   Temp 98.1 F (36.7 C)   Resp 18   Ht 5\' 3"  (1.6 m)   Wt 57.6 kg   LMP 07/05/2016 (Approximate)   SpO2 100%   BMI 22.50 kg/m   Physical Exam Vitals signs and nursing note reviewed.    58 year old female, pacing the floor in obvious pain, but in no acute distress. Vital signs are significant for slow heart rate. Oxygen saturation is 100%, which is normal. Head is normocephalic and atraumatic. PERRLA, EOMI. Oropharynx is clear. Neck is nontender and supple without adenopathy or JVD. Back is nontender and there is no CVA tenderness. Lungs are clear without rales, wheezes, or rhonchi. Chest is nontender. Heart has regular rate and rhythm without murmur. Abdomen is soft, flat, with right lower quadrant tenderness.  There is no rebound or guarding.  There are no masses or hepatosplenomegaly and peristalsis is hypoactive. Extremities have no cyanosis or edema, full range of motion is present. Skin is warm and dry  without rash. Neurologic: Mental status is normal, cranial nerves are intact, there are no motor or sensory deficits.  ED Treatments /  Results  Labs (all labs ordered are listed, but only abnormal results are displayed) Labs Reviewed  COMPREHENSIVE METABOLIC PANEL - Abnormal; Notable for the following components:      Result Value   Glucose, Bld 179 (*)    BUN 24 (*)    Total Protein 6.2 (*)    Alkaline Phosphatase 36 (*)    All other components within normal limits  CBC - Abnormal; Notable for the following components:   WBC 14.7 (*)    All other components within normal limits  LIPASE, BLOOD  URINALYSIS, ROUTINE W REFLEX MICROSCOPIC   Radiology Ct Renal Stone Study  Result Date: 02/27/2018 CLINICAL DATA:  58 year old female with abdominal pain and vomiting. EXAM: CT ABDOMEN AND PELVIS WITHOUT CONTRAST TECHNIQUE: Multidetector CT imaging of the abdomen and pelvis was performed following the standard protocol without IV contrast. COMPARISON:  CT of the abdomen pelvis dated 11/15/2015 FINDINGS: Evaluation of this exam is limited in the absence of intravenous contrast. Lower chest: The visualized lung bases are clear. There is mild cardiomegaly. No intra-abdominal free air. Diffuse mesenteric edema and small ascites. Hepatobiliary: A 1 cm hypodense lesion in the right lobe of the liver is not characterized but appears similar to prior CT most consistent with a cyst. The liver is otherwise unremarkable. No intrahepatic biliary ductal dilatation. The gallbladder is physiologically distended and appears unremarkable. Pancreas: Unremarkable. No pancreatic ductal dilatation or surrounding inflammatory changes. Spleen: Normal in size without focal abnormality. Adrenals/Urinary Tract: The adrenal glands are unremarkable. The kidneys and urinary bladder appear unremarkable as well. Stomach/Bowel: Evaluation of the bowel is limited in the absence of oral contrast. The stomach is distended. There is  twisting of the bowel in the right hemiabdomen likely around the axis of the proximal transverse colon and the terminal ileum. There is severe edema and inflammatory changes of the cecum and ascending colon. The cecum is distended and appears to be in the left hemiabdomen. 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 Clinical correlation and surgical consult is advised. No definite pneumatosis identified at this time however, the severity of inflammatory changes of the cecum and ascending colon may suggest a degree of ischemia. There is associated obstruction of the cecum/ascending colon secondary to the twist which appears to have caused a closed loop obstruction. The distal colon is decompressed. The appendix is not identified with certainty. Vascular/Lymphatic: The abdominal aorta and IVC are grossly unremarkable on this noncontrast CT. No portal venous gas. There is no adenopathy. Reproductive: The uterus and ovaries are grossly unremarkable. Other: None Musculoskeletal: Partially visualized breast implants. L2-L3 degenerative changes with grade 1 retrolisthesis. No acute osseous pathology. IMPRESSION: Findings most consistent with a cecal volvulus versus twisting of the cecum and ascending colon secondary to an internal hernia. There is associated closed loop obstruction with severe edema and findings concerning for early bowel ischemia. Clinical correlation and surgical consult is advised. No free air or portal venous gas identified at this time. These results were called by telephone at the time of interpretation on 02/27/2018 at 3:38 am to Dr. Delora Fuel , who verbally acknowledged these results. Electronically Signed   By: Anner Crete M.D.   On: 02/27/2018 03:40    Procedures Procedures  CRITICAL CARE Performed by: Delora Fuel Total critical care time: 50 minutes Critical care time was exclusive of separately  billable procedures and treating other patients. Critical care was necessary to treat or  prevent imminent or life-threatening deterioration. Critical care was time spent personally by me on the following activities: development of treatment plan with patient and/or surrogate as well as nursing, discussions with consultants, evaluation of patient's response to treatment, examination of patient, obtaining history from patient or surrogate, ordering and performing treatments and interventions, ordering and review of laboratory studies, ordering and review of radiographic studies, pulse oximetry and re-evaluation of patient's condition.  Medications Ordered in ED Medications  HYDROmorphone (DILAUDID) 2 MG/ML injection (0.5 mg Intravenous Not Given 02/27/18 0244)  ondansetron (ZOFRAN) 4 MG/2ML injection (4 mg Intravenous Given 02/27/18 0114)  sodium chloride flush (NS) 0.9 % injection 3 mL (3 mLs Intravenous Given 02/27/18 0116)  morphine 4 MG/ML injection 4 mg (4 mg Intravenous Given 02/27/18 0211)  ketorolac (TORADOL) 30 MG/ML injection 30 mg (30 mg Intravenous Given 02/27/18 0213)  metoCLOPramide (REGLAN) injection 10 mg (10 mg Intravenous Given 02/27/18 0214)  diphenhydrAMINE (BENADRYL) injection 25 mg (25 mg Intravenous Given 02/27/18 0215)  sodium chloride 0.9 % bolus 1,000 mL (0 mLs Intravenous Stopped 02/27/18 0344)  HYDROmorphone (DILAUDID) injection 0.5 mg (0.5 mg Intravenous Not Given 02/27/18 0534)  HYDROmorphone (DILAUDID) injection 0.5 mg (0.5 mg Intravenous Given 02/27/18 0321)  cefoTEtan (CEFOTAN) 2 g in sodium chloride 0.9 % 100 mL IVPB (0 g Intravenous Stopped 02/27/18 0533)  sodium chloride 0.9 % bolus 1,000 mL (0 mLs Intravenous Stopped 02/27/18 0533)  HYDROmorphone (DILAUDID) injection 1 mg (1 mg Intravenous Given 02/27/18 0401)  HYDROmorphone (DILAUDID) injection 1 mg (1 mg Intravenous Given 02/27/18 0603)     Initial Impression / Assessment and Plan / ED Course  I have reviewed the triage  vital signs and the nursing notes.  Pertinent labs & imaging results that were available during my care of the patient were reviewed by me and considered in my medical decision making (see chart for details).  Right lower quadrant pain.  Clinical presentation is strongly suggestive of ureteral colic.  Old records are reviewed, and in 2017, she had a CT angiogram of abdomen and pelvis at which time no renal calculi were seen.  Other possible causes of pain include pancreatitis, appendicitis, diverticulitis.  She will be given IV fluids, morphine, metoclopramide, ondansetron and sent for CT renal stone protocol.  Of note, she had previously received a dose of ondansetron without relief of nausea.  Pain has been difficult to control.  She has been given several doses of hydromorphone and continues to be in pain.  CT scan shows no evidence of renal calculi.  She appears to have a cecal volvulus.  I have reviewed the images and discussed them with radiologist.  Case is also discussed with Dr. Arnoldo Morale, on-call for general surgery.  He will be making arrangements for surgical management.  In the meantime, she is given additional IV fluids, additional IV hydromorphone and given cefotetan for antibiotic prophylaxis.  Final Clinical Impressions(s) / ED Diagnoses   Final diagnoses:  Cecal volvulus [facility])    ED Discharge Orders    None       Delora Fuel, MD 41/96/22 [phone]

## 2018-02-28 ENCOUNTER — Encounter (HOSPITAL_COMMUNITY): Payer: Self-pay

## 2018-02-28 LAB — CBC
HEMATOCRIT: 33.1 % — AB (ref 36.0–46.0)
Hemoglobin: 10.8 g/dL — ABNORMAL LOW (ref 12.0–15.0)
MCH: 32 pg (ref 26.0–34.0)
MCHC: 32.6 g/dL (ref 30.0–36.0)
MCV: 98.2 fL (ref 80.0–100.0)
Platelets: 156 10*3/uL (ref 150–400)
RBC: 3.37 MIL/uL — ABNORMAL LOW (ref 3.87–5.11)
RDW: 13.2 % (ref 11.5–15.5)
WBC: 14.2 10*3/uL — ABNORMAL HIGH (ref 4.0–10.5)
nRBC: 0 % (ref 0.0–0.2)

## 2018-02-28 LAB — PHOSPHORUS: Phosphorus: 2.6 mg/dL (ref 2.5–4.6)

## 2018-02-28 LAB — BASIC METABOLIC PANEL
Anion gap: 5 (ref 5–15)
BUN: 16 mg/dL (ref 6–20)
CHLORIDE: 103 mmol/L (ref 98–111)
CO2: 26 mmol/L (ref 22–32)
Calcium: 7.9 mg/dL — ABNORMAL LOW (ref 8.9–10.3)
Creatinine, Ser: 0.73 mg/dL (ref 0.44–1.00)
GFR calc Af Amer: >60 mL/min (ref >60)
GFR calc non Af Amer: >60 mL/min (ref >60)
Glucose, Bld: 132 mg/dL — ABNORMAL HIGH (ref 70–99)
Potassium: 3.9 mmol/L (ref 3.5–5.1)
Sodium: 134 mmol/L — ABNORMAL LOW (ref 135–145)

## 2018-02-28 LAB — MAGNESIUM: Magnesium: 1.6 mg/dL — ABNORMAL LOW (ref 1.7–2.4)

## 2018-02-28 MED ORDER — MAGNESIUM SULFATE 2 GM/50ML IV SOLN
2.0000 g | Freq: Once | INTRAVENOUS | Status: AC
  Administered 2018-02-28: 2 g via INTRAVENOUS
  Filled 2018-02-28: qty 50

## 2018-02-28 MED ORDER — BUPIVACAINE LIPOSOME 1.3 % IJ SUSP
INTRAMUSCULAR | Status: DC | PRN
  Administered 2018-02-28: 20 mL

## 2018-02-28 MED ORDER — HYDROMORPHONE HCL 1 MG/ML IJ SOLN
1.0000 mg | INTRAMUSCULAR | Status: DC | PRN
  Administered 2018-02-28 – 2018-03-01 (×5): 1 mg via INTRAVENOUS
  Filled 2018-02-28 (×5): qty 1

## 2018-02-28 MED ORDER — POVIDONE-IODINE 10 % OINT PACKET
TOPICAL_OINTMENT | CUTANEOUS | Status: DC | PRN
  Administered 2018-02-28: 1 via TOPICAL

## 2018-02-28 NOTE — Progress Notes (Signed)
1 Day Post-Op  Subjective: Patient having moderate incisional pain.  Fentanyl does not seem to be helping.  No nausea or vomiting is noted.  No flatus or bowel movement yet.  Objective: Vital signs in last 24 hours: Temp:  [97.9 F (36.6 C)-99.7 F (37.6 C)] 98.2 F (36.8 C) (02/24 0746) Pulse Rate:  [66-96] 72 (02/24 0746) Resp:  [9-22] 15 (02/24 0746) BP: (107-122)/(55-76) 111/71 (02/23 2000) SpO2:  [96 %-100 %] 98 % (02/24 0746) Weight:  [65.6 kg-66.2 kg] 65.6 kg (02/24 0500)    Intake/Output from previous day: 02/23 0701 - 02/24 0700 In: 4555.3 [P.O.:400; I.V.:3955.3; IV Piggyback:200] Out: 2025 [QIHKV:4259; Blood:50] Intake/Output this shift: No intake/output data recorded.  General appearance: alert, cooperative and no distress Resp: clear to auscultation bilaterally Cardio: regular rate and rhythm, S1, S2 normal, no murmur, click, rub or gallop GI: Soft, incision healing well.  Minimal bowel sounds appreciated.  Lab Results:  Recent Labs    02/27/18 0110 02/28/18 0511  WBC 14.7* 14.2*  HGB 13.1 10.8*  HCT 40.0 33.1*  PLT 231 156   BMET Recent Labs    02/27/18 0110 02/28/18 0511  NA 140 134*  K 4.0 3.9  CL 106 103  CO2 28 26  GLUCOSE 179* 132*  BUN 24* 16  CREATININE 0.67 0.73  CALCIUM 9.2 7.9*   PT/INR No results for input(s): LABPROT, INR in the last 72 hours.  Studies/Results: Ct Renal Stone Study  Result Date: 02/27/2018 CLINICAL DATA:  58 year old female with abdominal pain and vomiting. EXAM: CT ABDOMEN AND PELVIS WITHOUT CONTRAST TECHNIQUE: Multidetector CT imaging of the abdomen and pelvis was performed following the standard protocol without IV contrast. COMPARISON:  CT of the abdomen pelvis dated 11/15/2015 FINDINGS: Evaluation of this exam is limited in the absence of intravenous contrast. Lower chest: The visualized lung bases are clear. There is mild cardiomegaly. No intra-abdominal free air. Diffuse mesenteric edema and small ascites.  Hepatobiliary: A 1 cm hypodense lesion in the right lobe of the liver is not characterized but appears similar to prior CT most consistent with a cyst. The liver is otherwise unremarkable. No intrahepatic biliary ductal dilatation. The gallbladder is physiologically distended and appears unremarkable. Pancreas: Unremarkable. No pancreatic ductal dilatation or surrounding inflammatory changes. Spleen: Normal in size without focal abnormality. Adrenals/Urinary Tract: The adrenal glands are unremarkable. The kidneys and urinary bladder appear unremarkable as well. Stomach/Bowel: Evaluation of the bowel is limited in the absence of oral contrast. The stomach is distended. There is twisting of the bowel in the right hemiabdomen likely around the axis of the proximal transverse colon and the terminal ileum. There is severe edema and inflammatory changes of the cecum and ascending colon. The cecum is distended and appears to be in the left hemiabdomen. 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 Clinical correlation and surgical consult is advised. No definite pneumatosis identified at this time however, the severity of inflammatory changes of the cecum and ascending colon may suggest a degree of ischemia. There is associated obstruction of the cecum/ascending colon secondary to the twist which appears to have caused a closed loop obstruction. The distal colon is decompressed. The appendix is not identified with certainty. Vascular/Lymphatic: The abdominal aorta and IVC are grossly unremarkable on this noncontrast CT. No portal venous gas. There is no adenopathy. Reproductive: The uterus and ovaries are grossly unremarkable. Other: None Musculoskeletal: Partially visualized breast implants. L2-L3 degenerative changes  with grade 1 retrolisthesis. No acute osseous pathology. 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 There is associated closed loop obstruction with severe edema and findings concerning for early bowel ischemia. Clinical correlation and surgical consult is advised. No free air or portal venous gas identified at this time. These results were called by telephone at the time of interpretation on 02/27/2018 at 3:38 am to Dr. Delora Fuel , who verbally acknowledged these results. Electronically Signed   By: Anner Crete M.D.   On: 02/27/2018 03:40    Anti-infectives: Anti-infectives (From admission, onward)   Start     Dose/Rate Route Frequency Ordered Stop   02/27/18 1600  cefoTEtan (CEFOTAN) 2 g in sodium chloride 0.9 % 100 mL IVPB     2 g 200 mL/hr over 30 Minutes Intravenous Every 12 hours 02/27/18 1107     02/27/18 0400  cefoTEtan (CEFOTAN) 2 g in sodium chloride 0.9 % 100 mL IVPB     2 g 200 mL/hr over 30 Minutes Intravenous  Once 02/27/18 0344 02/27/18 0533   02/27/18 0400  Ampicillin-Sulbactam (UNASYN) 3 g in sodium chloride 0.9 % 100 mL IVPB  Status:  Discontinued     3 g 200 mL/hr over 30 Minutes Intravenous  Once 02/27/18 0350 02/27/18 0350   02/27/18 0345  ampicillin-sulbactam (UNASYN) injection 3 g  Status:  Discontinued     3 g Intravenous  Once 02/27/18 0339 02/27/18 0344   02/27/18 0345  Ampicillin-Sulbactam (UNASYN) 3 g in sodium chloride 0.9 % 100 mL IVPB  Status:  Discontinued     3 g 200 mL/hr over 30 Minutes Intravenous Every 6 hours 02/27/18 0350 02/27/18 0352      Assessment/Plan: s/p Procedure(s): EXPLORATORY LAPAROTOMY LAPAROSCOPIC RIGHT HEMI COLECTOMY Impression: Stable on postoperative day 1.  We will switch IV pain medications to Dilaudid.  Mild hypomagnesemia will be addressed.  Will remove Foley.  Awaiting return of bowel function.  LOS: 1 day    [name] Signs 02/28/2018

## 2018-03-01 ENCOUNTER — Encounter (HOSPITAL_COMMUNITY): Payer: Self-pay

## 2018-03-01 LAB — BASIC METABOLIC PANEL
Anion gap: 5 (ref 5–15)
BUN: 10 mg/dL (ref 6–20)
CO2: 31 mmol/L (ref 22–32)
Calcium: 8.1 mg/dL — ABNORMAL LOW (ref 8.9–10.3)
Chloride: 101 mmol/L (ref 98–111)
Creatinine, Ser: 0.76 mg/dL (ref 0.44–1.00)
Glucose, Bld: 111 mg/dL — ABNORMAL HIGH (ref 70–99)
Potassium: 4.2 mmol/L (ref 3.5–5.1)
SODIUM: 137 mmol/L (ref 135–145)

## 2018-03-01 LAB — CBC
HCT: 31 % — ABNORMAL LOW (ref 36.0–46.0)
Hemoglobin: 9.8 g/dL — ABNORMAL LOW (ref 12.0–15.0)
MCH: 31.1 pg (ref 26.0–34.0)
MCHC: 31.6 g/dL (ref 30.0–36.0)
MCV: 98.4 fL (ref 80.0–100.0)
NRBC: 0 % (ref 0.0–0.2)
Platelets: 149 10*3/uL — ABNORMAL LOW (ref 150–400)
RBC: 3.15 MIL/uL — ABNORMAL LOW (ref 3.87–5.11)
RDW: 13.4 % (ref 11.5–15.5)
WBC: 12.6 10*3/uL — ABNORMAL HIGH (ref 4.0–10.5)

## 2018-03-01 LAB — PHOSPHORUS: Phosphorus: 2.2 mg/dL — ABNORMAL LOW (ref 2.5–4.6)

## 2018-03-01 LAB — MAGNESIUM: MAGNESIUM: 1.9 mg/dL (ref 1.7–2.4)

## 2018-03-01 NOTE — Progress Notes (Signed)
2 Days Post-Op  Subjective: Patient tolerating full liquid diet well.  Has had bowel movements.  Is passing flatus.  Objective: Vital signs in last 24 hours: Temp:  [97.7 F (36.5 C)-99.6 F (37.6 C)] 98.5 F (36.9 C) (02/25 0507) Pulse Rate:  [72-78] 78 (02/25 0507) Resp:  [16-17] 17 (02/25 0507) BP: (106-115)/(71-78) 106/71 (02/25 0507) SpO2:  [95 %-98 %] 98 % (02/25 0507) Last BM Date: 02/28/18  Intake/Output from previous day: 02/24 0701 - 02/25 0700 In: 1862.3 [P.O.:240; I.V.:1422.3; IV Piggyback:200] Out: -  Intake/Output this shift: No intake/output data recorded.  General appearance: alert, cooperative and no distress Resp: clear to auscultation bilaterally Cardio: regular rate and rhythm, S1, S2 normal, no murmur, click, rub or gallop GI: Soft, bowel sounds active.  Incision healing well.  Lab Results:  Recent Labs    02/28/18 0511 03/01/18 0454  WBC 14.2* 12.6*  HGB 10.8* 9.8*  HCT 33.1* 31.0*  PLT 156 149*   BMET Recent Labs    02/28/18 0511 03/01/18 0454  NA 134* 137  K 3.9 4.2  CL 103 101  CO2 26 31  GLUCOSE 132* 111*  BUN 16 10  CREATININE 0.73 0.76  CALCIUM 7.9* 8.1*   PT/INR No results for input(s): LABPROT, INR in the last 72 hours.  Studies/Results: No results found.  Anti-infectives: Anti-infectives (From admission, onward)   Start     Dose/Rate Route Frequency Ordered Stop   02/27/18 1600  cefoTEtan (CEFOTAN) 2 g in sodium chloride 0.9 % 100 mL IVPB     2 g 200 mL/hr over 30 Minutes Intravenous Every 12 hours 02/27/18 1107     02/27/18 0400  cefoTEtan (CEFOTAN) 2 g in sodium chloride 0.9 % 100 mL IVPB     2 g 200 mL/hr over 30 Minutes Intravenous  Once 02/27/18 0344 02/27/18 0533   02/27/18 0400  Ampicillin-Sulbactam (UNASYN) 3 g in sodium chloride 0.9 % 100 mL IVPB  Status:  Discontinued     3 g 200 mL/hr over 30 Minutes Intravenous  Once 02/27/18 0350 02/27/18 0350   02/27/18 0345  ampicillin-sulbactam (UNASYN) injection 3 g   Status:  Discontinued     3 g Intravenous  Once 02/27/18 4585 02/27/18 0344   02/27/18 0345  Ampicillin-Sulbactam (UNASYN) 3 g in sodium chloride 0.9 % 100 mL IVPB  Status:  Discontinued     3 g 200 mL/hr over 30 Minutes Intravenous Every 6 hours 02/27/18 0350 02/27/18 0352      Assessment/Plan: s/p Procedure(s): EXPLORATORY LAPAROTOMY LAPAROSCOPIC RIGHT HEMI COLECTOMY Impression: Continues to progress well on postoperative day 2.  Bowel function has returned.  Will advance to regular diet.  Will treat hypophosphatemia.  Anticipate discharge in next 24 to 48 hours.  LOS: 2 days    [name] Signs 03/01/2018

## 2018-03-02 MED ORDER — ONDANSETRON HCL 4 MG PO TABS: 4.0000 mg | ORAL_TABLET | Freq: Three times a day (TID) | ORAL | 0 refills | Status: DC | PRN

## 2018-03-02 MED ORDER — OXYCODONE-ACETAMINOPHEN 7.5-325 MG PO TABS: 1.0000 | ORAL_TABLET | Freq: Four times a day (QID) | ORAL | 0 refills | Status: DC | PRN

## 2018-03-02 NOTE — Discharge Instructions (Signed)
Open Colectomy, Care After °This sheet gives you information about how to care for yourself after your procedure. Your health care provider may also give you more specific instructions. If you have problems or questions, contact your health care provider. °What can I expect after the procedure? °After the procedure, it is common to have: °· Pain in your abdomen, especially along your incision. °· Tiredness. Your energy level will return to normal over the next several weeks. °· Constipation. °· Nausea. °· Difficulty urinating. °Follow these instructions at home: °Activity °· You may be able to return to most of your normal activities within 1-2 weeks, such as working, walking up stairs, and sexual activity. °· Avoid activities that require a lot of energy for 4-6 weeks after surgery, such as running, climbing, and lifting heavy objects. Ask your health care provider what activities are safe for you. °· Take rest breaks during the day as needed. °· Do not drive for 1-2 weeks or until your health care provider says that it is safe. °· Do not drive or use heavy machinery while taking prescription pain medicines. °· Do not lift anything that is heavier than 10 lb (4.3 kg) until your health care provider says that it is safe. °Incision care ° °· Follow instructions from your health care provider about how to take care of your incision. Make sure you: °? Wash your hands with soap and water before you change your bandage (dressing). If soap and water are not available, use hand sanitizer. °? Change your dressing as told by your health care provider. °? Leave stitches (sutures) or staples in place. These skin closures may need to stay in place for 2 weeks or longer. °· Avoid wearing tight clothing around your incision. °· Protect your incision area from the sun. °· Check your incision area every day for signs of infection. Check for: °? More redness, swelling, or pain. °? More fluid or blood. °? Warmth. °? Pus or a bad  smell. °General instructions °· Do not take baths, swim, or use a hot tub until your health care provider approves. Ask your health care provider when you may shower. °· Take over-the-counter and prescription medicines, including stool softeners, only as told by your health care provider. °· Eat a low-fat and low-fiber diet for the first 4 weeks after surgery. °· Keep all follow-up visits as told by your health care provider. This is important. °Contact a health care provider if: °· You have more redness, swelling, or pain around your incision. °· You have more fluid or blood coming from your incision. °· Your incision feels warm to the touch. °· You have pus or a bad smell coming from your incision. °· You have a fever or chills. °· You do not have a bowel movement 2-3 days after surgery. °· You cannot eat or drink for 24 hours or more. °· You have persistent nausea and vomiting. °· You have abdominal pain that gets worse and does not get better with medicine. °Get help right away if: °· You have chest pain. °· You have shortness of breath. °· You have pain or swelling in your legs. °· Your incision breaks open after your sutures or staples have been removed. °· You have bleeding from the rectum. °This information is not intended to replace advice given to you by your health care provider. Make sure you discuss any questions you have with your health care provider. °Document Released: 07/15/2010 Document Revised: 09/23/2015 Document Reviewed: 09/23/2015 °Elsevier Interactive Patient   Education  2019 [employer].

## 2018-03-02 NOTE — Discharge Summary (Signed)
Physician Discharge Summary  Patient ID: Catherine Short MRN: [id number] DOB/AGE: [birth year] 58 y.o.  Admit date: 02/27/2018 Discharge date: 03/02/2018  Admission Diagnoses: Cecal volvulus  Discharge Diagnoses: Same Active Problems:   Cecal volvulus [facility])   Discharged Condition: good  Hospital Course: Patient is a 58 year old white female who presented emergency room with abdominal pain and distention.  CT scan of the abdomen revealed cecal volvulus.  Patient was taken to the operating room on 03/28/2018 and underwent a right hemicolectomy.  She tolerated the procedure well.  Postoperative course was unremarkable.  Her diet was advanced without difficulty.  The patient is being discharged home on 03/02/2018 in good and improving condition.  Final pathology revealed ischemic colon.  No evidence of malignancy.  Treatments: surgery: Right hemicolectomy on 02/27/2018  Discharge Exam: Blood pressure (!) 130/95, pulse 97, temperature 97.9 F (36.6 C), temperature source Oral, resp. rate 17, height 5\' 3"  (1.6 m), weight 65.6 kg, last menstrual period 07/05/2016, SpO2 94 %. General appearance: alert, cooperative and no distress Resp: clear to auscultation bilaterally Cardio: regular rate and rhythm, S1, S2 normal, no murmur, click, rub or gallop GI: Soft, incision healing well.  Bowel sounds active.  Disposition: Discharge disposition: 01-Home or Self Care       Discharge Instructions    Diet - low sodium heart healthy   Complete by:  As directed    Increase activity slowly   Complete by:  As directed      Allergies as of 03/02/2018      Reactions   Cyclobenzaprine Hcl    REACTION: disoriented-Flexeril      Medication List    TAKE these medications   ALEVE PO Take by mouth daily as needed.   BIOTIN PO Take 10,000 mg by mouth daily.   CALCIUM MAGNESIUM PO Take by mouth daily.   COLLAGEN PO Take by mouth daily.   FISH OIL PO Take by mouth. DHA and EFA-daily    GLUTATHIONE PO Take 500 mg by mouth daily.   oxyCODONE-acetaminophen 7.5-325 MG tablet Commonly known as:  PERCOCET Take 1 tablet by mouth every 6 (six) hours as needed.   VITAMIN D-3 PO Take by mouth daily.   vitamin E 1000 UNIT capsule Take 1,000 Units by mouth daily.      Follow-up Information    [name] Signs, MD. Schedule an appointment as soon as possible for a visit on 03/08/2018.   Specialty:  General Surgery Contact information: [address] [place] [address] [phone]           Signed: [name] Signs 03/02/2018, 8:28 AM

## 2018-03-02 NOTE — Progress Notes (Signed)
IV removed, WNL. D/C instructions given to pt. Verbalized understanding. Awaiting spouse to transport home.

## 2018-03-04 ENCOUNTER — Other Ambulatory Visit: Payer: Self-pay | Admitting: General Surgery

## 2018-03-04 MED ORDER — FLUCONAZOLE 100 MG PO TABS: 100.0000 mg | ORAL_TABLET | Freq: Every day | ORAL | 0 refills | Status: DC

## 2018-03-04 MED ORDER — METHOCARBAMOL 500 MG PO TABS: 500.0000 mg | ORAL_TABLET | Freq: Four times a day (QID) | ORAL | 1 refills | Status: DC | PRN

## 2018-03-08 ENCOUNTER — Encounter: Payer: Self-pay | Admitting: General Surgery

## 2018-03-08 ENCOUNTER — Ambulatory Visit (INDEPENDENT_AMBULATORY_CARE_PROVIDER_SITE_OTHER): Payer: Self-pay | Admitting: General Surgery

## 2018-03-08 VITALS — BP 98/64 | HR 60 | Temp 98.2°F | Resp 18 | Wt 136.0 lb

## 2018-03-08 DIAGNOSIS — Z09 Encounter for follow-up examination after completed treatment for conditions other than malignant neoplasm: Secondary | ICD-10-CM

## 2018-03-08 NOTE — Progress Notes (Signed)
Subjective:     Catherine Short  Here for postoperative visit.  Patient having moderate incisional pain.  She is having some loose bowel movements after she eats.  She denies any fever, chills. Objective:    BP 98/64 (BP Location: Left Arm, Patient Position: Sitting, Cuff Size: Normal)   Pulse 60   Temp 98.2 F (36.8 C) (Temporal)   Resp 18   Wt 136 lb (61.7 kg)   LMP 07/05/2016 (Approximate)   BMI 24.09 kg/m   General:  alert, cooperative and no distress  Abdomen soft, incision healing well.  Staples removed, Steri-Strips applied. Final pathology reviewed with the patient.     Assessment:    Doing well postoperatively.    Plan:   May increase activity as able, but avoid any heavy lifting.  She will try probiotics to help with her loose stools.  We will follow-up in the office in 3 weeks.

## 2018-03-28 ENCOUNTER — Other Ambulatory Visit: Payer: Self-pay | Admitting: General Surgery

## 2018-03-28 DIAGNOSIS — Z09 Encounter for follow-up examination after completed treatment for conditions other than malignant neoplasm: Secondary | ICD-10-CM

## 2018-03-28 MED ORDER — DICYCLOMINE HCL 20 MG PO TABS: 20.0000 mg | ORAL_TABLET | Freq: Three times a day (TID) | ORAL | 1 refills | Status: DC

## 2018-03-28 MED ORDER — LOPERAMIDE HCL 2 MG PO TABS: 2.0000 mg | ORAL_TABLET | Freq: Four times a day (QID) | ORAL | 1 refills | Status: DC | PRN

## 2018-03-29 ENCOUNTER — Ambulatory Visit: Payer: Self-pay | Admitting: General Surgery

## 2018-04-30 ENCOUNTER — Emergency Department (HOSPITAL_COMMUNITY): Payer: BLUE CROSS/BLUE SHIELD

## 2018-04-30 ENCOUNTER — Emergency Department (HOSPITAL_COMMUNITY)
Admission: EM | Admit: 2018-04-30 | Discharge: 2018-04-30 | Disposition: A | Discharge status: Home / Self Care | Payer: BLUE CROSS/BLUE SHIELD | Attending: Emergency Medicine | Admitting: Emergency Medicine

## 2018-04-30 ENCOUNTER — Encounter (HOSPITAL_COMMUNITY): Payer: Self-pay | Admitting: Emergency Medicine

## 2018-04-30 ENCOUNTER — Other Ambulatory Visit: Payer: Self-pay

## 2018-04-30 DIAGNOSIS — Z87891 Personal history of nicotine dependence: Secondary | ICD-10-CM | POA: Insufficient documentation

## 2018-04-30 DIAGNOSIS — R1031 Right lower quadrant pain: Secondary | ICD-10-CM | POA: Diagnosis not present

## 2018-04-30 DIAGNOSIS — Z79899 Other long term (current) drug therapy: Secondary | ICD-10-CM | POA: Diagnosis not present

## 2018-04-30 DIAGNOSIS — K56600 Partial intestinal obstruction, unspecified as to cause: Secondary | ICD-10-CM | POA: Diagnosis not present

## 2018-04-30 DIAGNOSIS — R109 Unspecified abdominal pain: Secondary | ICD-10-CM | POA: Diagnosis not present

## 2018-04-30 LAB — COMPREHENSIVE METABOLIC PANEL
ALT: 22 U/L (ref 0–44)
AST: 23 U/L (ref 15–41)
Albumin: 4.1 g/dL (ref 3.5–5.0)
Alkaline Phosphatase: 43 U/L (ref 38–126)
Anion gap: 9 (ref 5–15)
BUN: 21 mg/dL — ABNORMAL HIGH (ref 6–20)
CO2: 26 mmol/L (ref 22–32)
Calcium: 9.5 mg/dL (ref 8.9–10.3)
Chloride: 102 mmol/L (ref 98–111)
Creatinine, Ser: 0.68 mg/dL (ref 0.44–1.00)
GFR calc Af Amer: >60 mL/min (ref >60)
GFR calc non Af Amer: >60 mL/min (ref >60)
Glucose, Bld: 108 mg/dL — ABNORMAL HIGH (ref 70–99)
Potassium: 4 mmol/L (ref 3.5–5.1)
Sodium: 137 mmol/L (ref 135–145)
Total Bilirubin: 0.9 mg/dL (ref 0.3–1.2)
Total Protein: 6.5 g/dL (ref 6.5–8.1)

## 2018-04-30 LAB — CBC WITH DIFFERENTIAL/PLATELET
Abs Immature Granulocytes: 0.03 10*3/uL (ref 0.00–0.07)
Basophils Absolute: 0 10*3/uL (ref 0.0–0.1)
Basophils Relative: 0 %
Eosinophils Absolute: 0.1 10*3/uL (ref 0.0–0.5)
Eosinophils Relative: 1 %
HCT: 41.4 % (ref 36.0–46.0)
Hemoglobin: 13.3 g/dL (ref 12.0–15.0)
Immature Granulocytes: 0 %
Lymphocytes Relative: 17 %
Lymphs Abs: 1.9 10*3/uL (ref 0.7–4.0)
MCH: 30.4 pg (ref 26.0–34.0)
MCHC: 32.1 g/dL (ref 30.0–36.0)
MCV: 94.7 fL (ref 80.0–100.0)
Monocytes Absolute: 0.8 10*3/uL (ref 0.1–1.0)
Monocytes Relative: 7 %
Neutro Abs: 8.3 10*3/uL — ABNORMAL HIGH (ref 1.7–7.7)
Neutrophils Relative %: 75 %
Platelets: 225 10*3/uL (ref 150–400)
RBC: 4.37 MIL/uL (ref 3.87–5.11)
RDW: 13.2 % (ref 11.5–15.5)
WBC: 11.1 10*3/uL — ABNORMAL HIGH (ref 4.0–10.5)
nRBC: 0 % (ref 0.0–0.2)

## 2018-04-30 LAB — LIPASE, BLOOD: Lipase: 26 U/L (ref 11–51)

## 2018-04-30 MED ORDER — IOHEXOL 300 MG/ML  SOLN
30.0000 mL | Freq: Once | INTRAMUSCULAR | Status: AC | PRN
  Administered 2018-04-30: 30 mL via ORAL

## 2018-04-30 MED ORDER — ONDANSETRON HCL 4 MG/2ML IJ SOLN
4.0000 mg | Freq: Once | INTRAMUSCULAR | Status: AC
  Administered 2018-04-30: 4 mg via INTRAVENOUS
  Filled 2018-04-30: qty 2

## 2018-04-30 MED ORDER — MORPHINE SULFATE (PF) 4 MG/ML IV SOLN
4.0000 mg | Freq: Once | INTRAVENOUS | Status: AC
  Administered 2018-04-30: 4 mg via INTRAVENOUS
  Filled 2018-04-30: qty 1

## 2018-04-30 MED ORDER — IOHEXOL 300 MG/ML  SOLN
100.0000 mL | Freq: Once | INTRAMUSCULAR | Status: AC | PRN
  Administered 2018-04-30: 100 mL via INTRAVENOUS

## 2018-04-30 MED ORDER — HYDROCODONE-ACETAMINOPHEN 5-325 MG PO TABS: ORAL_TABLET | ORAL | 0 refills | Status: DC

## 2018-04-30 MED ORDER — HYDROCODONE-ACETAMINOPHEN 5-325 MG PO TABS: 2.0000 | ORAL_TABLET | ORAL | 0 refills | Status: DC | PRN

## 2018-04-30 NOTE — ED Provider Notes (Signed)
[facility] EMERGENCY DEPARTMENT Provider Note   CSN: [id number] Arrival date & time: 04/30/18  1435    History   Chief Complaint Chief Complaint  Patient presents with  . Abdominal Pain    HPI Catherine Short is a 58 y.o. female who was admitted 02/27/18 with abdominal pain, cecal volvulus undergoing a partial colectomy presenting with right lower abdominal pain and distention which woke her from sleep last night, nausea without emesis and several episodes of nonbloody liquid stools this am. She endorses abdominal distention and reduced appetite today, but has passed flatus.  She has been afebrile.  Pain is waxing and waning in character, described as sharp pain without radiation into her back, localizes in the RLQ and radiates to the epigastric region.  She has found no alleviators.  Of note, she called Dr. Arnoldo Morale her surgeon who suggested 24 hours watchful waiting.  She decided to come in as her sx remind her of her prior obstruction.      The history is provided by the patient.    Past Medical History:  Diagnosis Date  . Anxiety   . Chronic constipation   . Colitis   . Depression   . Fibroid   . H/O cold sores   . Hormone replacement therapy (HRT) 11/14/2014  . Hot flashes 11/14/2014  . Increased endometrial stripe thickness 05/30/2015   Will get biopsy  . Insomnia   . Liver cyst   . Menopause 11/14/2014  . Moody 05/23/2015  . Nerves 11/14/2014  . Night sweats 11/14/2014  . PMB (postmenopausal bleeding) 05/23/2015  . Prolactinoma ([place])   . Rectocele 05/23/2015    Patient Active Problem List   Diagnosis Date Noted  . Cecal volvulus ([place])   . Increased endometrial stripe thickness 05/30/2015  . Moody 05/23/2015  . PMB (postmenopausal bleeding) 05/23/2015  . Rectocele 05/23/2015  . Menopause 11/14/2014  . Hot flashes 11/14/2014  . Night sweats 11/14/2014  . Nerves 11/14/2014  . Hormone replacement therapy (HRT) 11/14/2014  . Unspecified constipation 11/14/2012  .  Tennis elbow 10/30/2010  . Rotator cuff syndrome of left shoulder 10/30/2010  . Muscle spasms of neck 10/30/2010  . GERD 11/11/2009  . Microscopic colitis 11/11/2009    Past Surgical History:  Procedure Laterality Date  . ABDOMINAL SURGERY    . AUGMENTATION MAMMAPLASTY  2000   saline implants  . BREAST ENHANCEMENT SURGERY  1996  . CARPAL TUNNEL RELEASE    . LAPAROSCOPIC RIGHT HEMI COLECTOMY Right 02/27/2018   Procedure: LAPAROSCOPIC RIGHT HEMI COLECTOMY;  Surgeon: [name] Signs, MD;  Location: [facility];  Service: General;  Laterality: Right;  . LAPAROTOMY N/A 02/27/2018   Procedure: EXPLORATORY LAPAROTOMY;  Surgeon: [name] Signs, MD;  Location: [facility];  Service: General;  Laterality: N/A;  . TONSILECTOMY, ADENOIDECTOMY, BILATERAL MYRINGOTOMY AND TUBES       OB History    Gravida  2   Para  1   Term      Preterm      AB  1   Living  1     SAB  1   TAB      Ectopic      Multiple      Live Births               Home Medications    Prior to Admission medications   Medication Sig Start Date End Date Taking? Authorizing Provider  BIOTIN PO Take 10,000 mg by mouth daily.    [provider]  Calcium-Magnesium-Vitamin D (CALCIUM MAGNESIUM PO) Take by mouth daily.    [provider]  Cholecalciferol (VITAMIN D-3 PO) Take by mouth daily.    [provider]  COLLAGEN PO Take by mouth daily.    [provider]  dicyclomine (BENTYL) 20 MG tablet Take 1 tablet (20 mg total) by mouth 3 (three) times daily before meals. 03/28/18   [name] Signs, MD  GLUTATHIONE PO Take 500 mg by mouth daily.    [provider]  loperamide (IMODIUM A-D) 2 MG tablet Take 1 tablet (2 mg total) by mouth 4 (four) times daily as needed for diarrhea or loose stools. 03/28/18   [name] Signs, MD  methocarbamol (ROBAXIN) 500 MG tablet Take 1 tablet (500 mg total) by mouth every 6 (six) hours as needed for muscle spasms. 03/04/18   [name] Signs, MD  Naproxen  Sodium (ALEVE PO) Take by mouth daily as needed.     [provider]  Omega-3 Fatty Acids (FISH OIL PO) Take by mouth. DHA and EFA-daily    [provider]  oxyCODONE-acetaminophen (PERCOCET) 7.5-325 MG tablet Take 1 tablet by mouth every 6 (six) hours as needed. 03/02/18 03/02/19  [name] Signs, MD  vitamin E 1000 UNIT capsule Take 1,000 Units by mouth daily.    [provider]    Family History Family History  Problem Relation Age of Onset  . Other Mother        digestive type issues  . Diabetes Mother   . Hypertension Mother   . Heart attack Father   . Heart disease Father   . Other Brother        MVA  . Cancer Maternal Grandmother        uterine  . Emphysema Maternal Grandfather   . Alcohol abuse Brother   . Cancer Paternal Grandfather        colon    Social History Social History   Tobacco Use  . Smoking status: Former Smoker    Years: 6.00    Types: Cigarettes  . Smokeless tobacco: Never Used  Substance Use Topics  . Alcohol use: Yes    Alcohol/week: 2.0 standard drinks    Types: 2 Glasses of wine per week    Comment: twice a week  . Drug use: No     Allergies   Cyclobenzaprine hcl   Review of Systems Review of Systems  Constitutional: Positive for appetite change. Negative for chills and fever.  HENT: Negative for congestion and sore throat.   Eyes: Negative.   Respiratory: Negative for chest tightness and shortness of breath.   Cardiovascular: Negative for chest pain.  Gastrointestinal: Positive for abdominal pain, diarrhea and nausea. Negative for vomiting.  Genitourinary: Negative.  Negative for dysuria.  Musculoskeletal: Negative for arthralgias, joint swelling and neck pain.  Skin: Negative.  Negative for rash and wound.  Neurological: Negative for dizziness, weakness, light-headedness, numbness and headaches.  Psychiatric/Behavioral: Negative.      Physical Exam Updated Vital Signs BP 130/80 (BP Location: Right Arm)    Pulse 62   Temp 97.8 F (36.6 C) (Oral)   Resp 18   Ht 5\' 3"  (1.6 m)   Wt 56.7 kg   LMP 07/05/2016 (Approximate)   SpO2 100%   BMI 22.14 kg/m   Physical Exam Vitals signs and nursing note reviewed.  Constitutional:      Appearance: She is well-developed.  HENT:     Head: Normocephalic and atraumatic.  Eyes:  Conjunctiva/sclera: Conjunctivae normal.  Neck:     Musculoskeletal: Normal range of motion.  Cardiovascular:     Rate and Rhythm: Normal rate and regular rhythm.     Heart sounds: Normal heart sounds.  Pulmonary:     Effort: Pulmonary effort is normal.     Breath sounds: Normal breath sounds. No wheezing.  Abdominal:     General: Bowel sounds are increased.     Palpations: Abdomen is soft.     Tenderness: There is abdominal tenderness in the right lower quadrant and periumbilical area. There is guarding. There is no rebound.     Comments: Increased tympany to percussion RLQ.  Increased bowel sounds right abdomen.   Musculoskeletal: Normal range of motion.  Skin:    General: Skin is warm and dry.  Neurological:     Mental Status: She is alert.      ED Treatments / Results  Labs (all labs ordered are listed, but only abnormal results are displayed) Results for orders placed or performed during the hospital encounter of 04/30/18  CBC with Differential  Result Value Ref Range   WBC 11.1 (H) 4.0 - 10.5 K/uL   RBC 4.37 3.87 - 5.11 MIL/uL   Hemoglobin 13.3 12.0 - 15.0 g/dL   HCT 41.4 36.0 - 46.0 %   MCV 94.7 80.0 - 100.0 fL   MCH 30.4 26.0 - 34.0 pg   MCHC 32.1 30.0 - 36.0 g/dL   RDW 13.2 11.5 - 15.5 %   Platelets 225 150 - 400 K/uL   nRBC 0.0 0.0 - 0.2 %   Neutrophils Relative % 75 %   Neutro Abs 8.3 (H) 1.7 - 7.7 K/uL   Lymphocytes Relative 17 %   Lymphs Abs 1.9 0.7 - 4.0 K/uL   Monocytes Relative 7 %   Monocytes Absolute 0.8 0.1 - 1.0 K/uL   Eosinophils Relative 1 %   Eosinophils Absolute 0.1 0.0 - 0.5 K/uL   Basophils Relative 0 %   Basophils  Absolute 0.0 0.0 - 0.1 K/uL   Immature Granulocytes 0 %   Abs Immature Granulocytes 0.03 0.00 - 0.07 K/uL  Comprehensive metabolic panel  Result Value Ref Range   Sodium 137 135 - 145 mmol/L   Potassium 4.0 3.5 - 5.1 mmol/L   Chloride 102 98 - 111 mmol/L   CO2 26 22 - 32 mmol/L   Glucose, Bld 108 (H) 70 - 99 mg/dL   BUN 21 (H) 6 - 20 mg/dL   Creatinine, Ser 0.68 0.44 - 1.00 mg/dL   Calcium 9.5 8.9 - 10.3 mg/dL   Total Protein 6.5 6.5 - 8.1 g/dL   Albumin 4.1 3.5 - 5.0 g/dL   AST 23 15 - 41 U/L   ALT 22 0 - 44 U/L   Alkaline Phosphatase 43 38 - 126 U/L   Total Bilirubin 0.9 0.3 - 1.2 mg/dL   GFR calc non Af Amer >60 >60 mL/min   GFR calc Af Amer >60 >60 mL/min   Anion gap 9 5 - 15  Lipase, blood  Result Value Ref Range   Lipase 26 11 - 51 U/L   Dg Abd Acute 2+v W 1v Chest  Result Date: 04/30/2018 CLINICAL DATA:  Acute onset of generalized abdominal pain that began at 1 o'clock this morning. Patient had a laparoscopic RIGHT hemicolectomy on 02/27/2018 for a cecal volvulus. EXAM: DG ABDOMEN ACUTE W/ 1V CHEST COMPARISON:  CT abdomen and pelvis 02/27/2018, 11/15/2015. No prior chest imaging. FINDINGS: Gaseous distention of  multiple loops of small bowel in the RIGHT mid abdomen, demonstrating air-fluid levels on the ERECT image. No evidence of free intraperitoneal air. Gas within normal caliber transverse colon descending colon and rectum. No visible opaque urinary tract calculi. Numerous phleboliths in the RIGHT side of the low pelvis. Cardiomediastinal silhouette unremarkable. Lungs clear. Bronchovascular markings normal. Pulmonary vascularity normal. No visible pleural effusions. No pneumothorax. IMPRESSION: 1. Partial small bowel obstruction is favored over a localized ileus in the RIGHT mid abdomen. No free intraperitoneal air. 2. No acute cardiopulmonary disease. Electronically Signed   By: Evangeline Dakin M.D.   On: 04/30/2018 16:28     EKG None  Radiology Dg Abd Acute 2+v W 1v  Chest  Result Date: 04/30/2018 CLINICAL DATA:  Acute onset of generalized abdominal pain that began at 1 o'clock this morning. Patient had a laparoscopic RIGHT hemicolectomy on 02/27/2018 for a cecal volvulus. EXAM: DG ABDOMEN ACUTE W/ 1V CHEST COMPARISON:  CT abdomen and pelvis 02/27/2018, 11/15/2015. No prior chest imaging. FINDINGS: Gaseous distention of multiple loops of small bowel in the RIGHT mid abdomen, demonstrating air-fluid levels on the ERECT image. No evidence of free intraperitoneal air. Gas within normal caliber transverse colon descending colon and rectum. No visible opaque urinary tract calculi. Numerous phleboliths in the RIGHT side of the low pelvis. Cardiomediastinal silhouette unremarkable. Lungs clear. Bronchovascular markings normal. Pulmonary vascularity normal. No visible pleural effusions. No pneumothorax. IMPRESSION: 1. Partial small bowel obstruction is favored over a localized ileus in the RIGHT mid abdomen. No free intraperitoneal air. 2. No acute cardiopulmonary disease. Electronically Signed   By: Evangeline Dakin M.D.   On: 04/30/2018 16:28    Procedures Procedures (including critical care time)  Medications Ordered in ED Medications  iohexol (OMNIPAQUE) 300 MG/ML solution 30 mL (has no administration in time range)  iohexol (OMNIPAQUE) 300 MG/ML solution 100 mL (has no administration in time range)  ondansetron (ZOFRAN) injection 4 mg (4 mg Intravenous Given 04/30/18 1606)     Initial Impression / Assessment and Plan / ED Course  I have reviewed the triage vital signs and the nursing notes.  Pertinent labs & imaging results that were available during my care of the patient were reviewed by me and considered in my medical decision making (see chart for details).        Pt with suspected small bowel obstruction, CT pending.  She was given IV zofran with resolution of nausea.  Deferred pain medicine at this time.    Discussed with Kem Parkinson, PA-C who  assumes are.   Final Clinical Impressions(s) / ED Diagnoses   Final diagnoses:  None    ED Discharge Orders    None       Landis Martins 04/30/18 1656    Fredia Sorrow, MD 05/01/18 2350

## 2018-04-30 NOTE — ED Triage Notes (Signed)
Colectomy 9 weeks ago by Dr Arnoldo Morale   abd pain since 0100 N- no V  Called Dr Arnoldo Morale who advised wait 24 hours   "freaking out"  Multiple BM today "pretty normal"

## 2018-04-30 NOTE — Discharge Instructions (Addendum)
You may take your nausea medication as directed if needed.  Bland diet for one week.  Follow-up with Dr. Arnoldo Morale office next week if needed.

## 2018-04-30 NOTE — ED Notes (Signed)
Completed oral contrast. 

## 2018-04-30 NOTE — ED Provider Notes (Signed)
1700  Pt signed out to me by Evalee Jefferson, PA-C at end of shift.    Pt with abdominal pain 8 weeks s/p partial colectomy,plain film imaging concerning for SBO, CT abd/pelvis was ordered. I will review CT scan findings and arrange dispo.  1725  Pt now requesting pain medication.  Morphine ordered.  She is drinking oral contrast at this time.      Ct Abdomen Pelvis W Contrast  Result Date: 04/30/2018 CLINICAL DATA:  Abdominal pain for 1 day. History of colitis and chronic constipation. History right hemicolectomy. EXAM: CT ABDOMEN AND PELVIS WITH CONTRAST TECHNIQUE: Multidetector CT imaging of the abdomen and pelvis was performed using the standard protocol following bolus administration of intravenous contrast. CONTRAST:  6mL OMNIPAQUE IOHEXOL 300 MG/ML SOLN, 170mL OMNIPAQUE IOHEXOL 300 MG/ML SOLN COMPARISON:  Radiographs today.  CT 02/27/2018. FINDINGS: Lower chest: Clear lung bases. No significant pleural or pericardial effusion. A right breast implant is partially imaged. Hepatobiliary: There is a stable cyst posteriorly in the right hepatic lobe. The liver otherwise appears unremarkable. No evidence of gallstones, gallbladder wall thickening or biliary dilatation. Pancreas: Unremarkable. No pancreatic ductal dilatation or surrounding inflammatory changes. Spleen: Normal in size without focal abnormality. Adrenals/Urinary Tract: Both adrenal glands appear normal. There is a small cyst in the interpolar region of the left kidney. The kidneys otherwise appear normal. No evidence of urinary tract calculus or hydronephrosis. The bladder appears normal. Stomach/Bowel: The patient has undergone right hemicolectomy with a small bowel to transverse colon anastomosis. The stomach and proximal small bowel appear normal. However, there are multiple mildly dilated and fluid-filled loops of distal small bowel extending to the ileocolonic anastomosis. There is some fecalization of the distal small bowel. The  ileocolonic anastomosis is widely patent, best seen on the reformatted images (coronal images 23-26 of series 6 and particular). There is liquid stool in the transverse colon. The distal colon is decompressed. Vascular/Lymphatic: There are no enlarged abdominal or pelvic lymph nodes. No significant vascular findings. Retroaortic left renal vein. Reproductive: The uterus and ovaries appear normal. No adnexal mass. Other: There is mild central mesenteric edema with mild interloop ascites extending into the pelvis. No focal extraluminal fluid collection. No evidence of free intraperitoneal air. Postsurgical changes in the anterior abdominal wall without hernia. Musculoskeletal: No acute or significant osseous findings. Stable degenerative disc disease and grade 1 retrolisthesis at L2-3. IMPRESSION: 1. CT confirms the presence of mild dilatation of the distal small bowel, although the ileocolonic anastomosis is widely patent status post right hemicolectomy. No clear mechanical obstruction identified. Radiographic follow up recommended. 2. Mesenteric edema with interloop and pelvic ascites. No focal extraluminal fluid collection. 3. No acute vascular findings. Electronically Signed   By: Richardean Sale M.D.   On: 04/30/2018 19:19   Dg Abd Acute 2+v W 1v Chest  Result Date: 04/30/2018 CLINICAL DATA:  Acute onset of generalized abdominal pain that began at 1 o'clock this morning. Patient had a laparoscopic RIGHT hemicolectomy on 02/27/2018 for a cecal volvulus. EXAM: DG ABDOMEN ACUTE W/ 1V CHEST COMPARISON:  CT abdomen and pelvis 02/27/2018, 11/15/2015. No prior chest imaging. FINDINGS: Gaseous distention of multiple loops of small bowel in the RIGHT mid abdomen, demonstrating air-fluid levels on the ERECT image. No evidence of free intraperitoneal air. Gas within normal caliber transverse colon descending colon and rectum. No visible opaque urinary tract calculi. Numerous phleboliths in the RIGHT side of the low  pelvis. Cardiomediastinal silhouette unremarkable. Lungs clear. Bronchovascular markings normal. Pulmonary vascularity  normal. No visible pleural effusions. No pneumothorax. IMPRESSION: 1. Partial small bowel obstruction is favored over a localized ileus in the RIGHT mid abdomen. No free intraperitoneal air. 2. No acute cardiopulmonary disease. Electronically Signed   By: Evangeline Dakin M.D.   On: 04/30/2018 16:28    2015 Consulted Dr. Arnoldo Morale and discussed findings.  No findings to suggest acute surgical abdomen.  Pt reassured.  Has anti-emetic at home.  Will give short course of pain medication, bland diet instructions given and she will f/u closely with Dr. Arnoldo Morale.  She appears appropriate for d/c home.       Kem Parkinson, PA-C 04/30/18 2034    Fredia Sorrow, MD 05/01/18 [phone]

## 2018-05-02 MED FILL — Hydrocodone-Acetaminophen Tab 5-325 MG: ORAL | Qty: 6 | Status: AC

## 2018-05-10 ENCOUNTER — Telehealth: Payer: Self-pay | Admitting: General Surgery

## 2018-05-10 NOTE — Telephone Encounter (Signed)
Returning patient's phonecall, no answer.

## 2018-05-10 NOTE — Telephone Encounter (Signed)
Left message for patient to call me

## 2018-07-01 ENCOUNTER — Other Ambulatory Visit: Payer: Self-pay | Admitting: Physician Assistant

## 2018-07-01 DIAGNOSIS — L709 Acne, unspecified: Secondary | ICD-10-CM | POA: Diagnosis not present

## 2018-07-01 DIAGNOSIS — D485 Neoplasm of uncertain behavior of skin: Secondary | ICD-10-CM | POA: Diagnosis not present

## 2018-07-01 DIAGNOSIS — D229 Melanocytic nevi, unspecified: Secondary | ICD-10-CM | POA: Diagnosis not present

## 2018-07-12 DIAGNOSIS — M150 Primary generalized (osteo)arthritis: Secondary | ICD-10-CM | POA: Diagnosis not present

## 2018-07-12 DIAGNOSIS — L409 Psoriasis, unspecified: Secondary | ICD-10-CM | POA: Diagnosis not present

## 2018-07-12 DIAGNOSIS — M2550 Pain in unspecified joint: Secondary | ICD-10-CM | POA: Diagnosis not present

## 2018-07-12 DIAGNOSIS — R5383 Other fatigue: Secondary | ICD-10-CM | POA: Diagnosis not present

## 2018-07-21 DIAGNOSIS — R2231 Localized swelling, mass and lump, right upper limb: Secondary | ICD-10-CM | POA: Diagnosis not present

## 2018-08-11 DIAGNOSIS — M2550 Pain in unspecified joint: Secondary | ICD-10-CM | POA: Diagnosis not present

## 2018-08-11 DIAGNOSIS — M150 Primary generalized (osteo)arthritis: Secondary | ICD-10-CM | POA: Diagnosis not present

## 2018-08-11 DIAGNOSIS — L409 Psoriasis, unspecified: Secondary | ICD-10-CM | POA: Diagnosis not present

## 2018-08-18 DIAGNOSIS — M180 Bilateral primary osteoarthritis of first carpometacarpal joints: Secondary | ICD-10-CM | POA: Diagnosis not present

## 2018-08-18 DIAGNOSIS — R2230 Localized swelling, mass and lump, unspecified upper limb: Secondary | ICD-10-CM | POA: Diagnosis not present

## 2018-08-18 DIAGNOSIS — M7989 Other specified soft tissue disorders: Secondary | ICD-10-CM | POA: Insufficient documentation

## 2018-09-02 ENCOUNTER — Inpatient Hospital Stay (HOSPITAL_COMMUNITY)
Admission: EM | Admit: 2018-09-02 | Discharge: 2018-09-05 | DRG: 390 | Disposition: A | Discharge status: Home / Self Care | Payer: BC Managed Care – PPO | Attending: General Surgery | Admitting: General Surgery

## 2018-09-02 ENCOUNTER — Telehealth: Payer: Self-pay

## 2018-09-02 ENCOUNTER — Emergency Department (HOSPITAL_COMMUNITY): Payer: BC Managed Care – PPO

## 2018-09-02 ENCOUNTER — Encounter (HOSPITAL_COMMUNITY): Payer: Self-pay | Admitting: *Deleted

## 2018-09-02 ENCOUNTER — Other Ambulatory Visit: Payer: Self-pay

## 2018-09-02 DIAGNOSIS — Z20828 Contact with and (suspected) exposure to other viral communicable diseases: Secondary | ICD-10-CM | POA: Diagnosis not present

## 2018-09-02 DIAGNOSIS — K56609 Unspecified intestinal obstruction, unspecified as to partial versus complete obstruction: Secondary | ICD-10-CM | POA: Diagnosis present

## 2018-09-02 DIAGNOSIS — Z87891 Personal history of nicotine dependence: Secondary | ICD-10-CM

## 2018-09-02 DIAGNOSIS — K5669 Other partial intestinal obstruction: Principal | ICD-10-CM | POA: Diagnosis present

## 2018-09-02 DIAGNOSIS — Z79899 Other long term (current) drug therapy: Secondary | ICD-10-CM | POA: Diagnosis not present

## 2018-09-02 DIAGNOSIS — R110 Nausea: Secondary | ICD-10-CM | POA: Diagnosis not present

## 2018-09-02 DIAGNOSIS — K567 Ileus, unspecified: Secondary | ICD-10-CM | POA: Diagnosis not present

## 2018-09-02 DIAGNOSIS — K5909 Other constipation: Secondary | ICD-10-CM | POA: Diagnosis present

## 2018-09-02 DIAGNOSIS — R197 Diarrhea, unspecified: Secondary | ICD-10-CM | POA: Diagnosis not present

## 2018-09-02 DIAGNOSIS — R51 Headache: Secondary | ICD-10-CM | POA: Diagnosis present

## 2018-09-02 DIAGNOSIS — R109 Unspecified abdominal pain: Secondary | ICD-10-CM | POA: Diagnosis not present

## 2018-09-02 LAB — CBC WITH DIFFERENTIAL/PLATELET
Abs Immature Granulocytes: 0.04 10*3/uL (ref 0.00–0.07)
Basophils Absolute: 0 10*3/uL (ref 0.0–0.1)
Basophils Relative: 0 %
Eosinophils Absolute: 0.1 10*3/uL (ref 0.0–0.5)
Eosinophils Relative: 1 %
HCT: 42.3 % (ref 36.0–46.0)
Hemoglobin: 14.1 g/dL (ref 12.0–15.0)
Immature Granulocytes: 0 %
Lymphocytes Relative: 19 %
Lymphs Abs: 1.7 10*3/uL (ref 0.7–4.0)
MCH: 31.3 pg (ref 26.0–34.0)
MCHC: 33.3 g/dL (ref 30.0–36.0)
MCV: 94 fL (ref 80.0–100.0)
Monocytes Absolute: 0.7 10*3/uL (ref 0.1–1.0)
Monocytes Relative: 8 %
Neutro Abs: 6.3 10*3/uL (ref 1.7–7.7)
Neutrophils Relative %: 72 %
Platelets: 244 10*3/uL (ref 150–400)
RBC: 4.5 MIL/uL (ref 3.87–5.11)
RDW: 14.6 % (ref 11.5–15.5)
WBC: 9 10*3/uL (ref 4.0–10.5)
nRBC: 0 % (ref 0.0–0.2)

## 2018-09-02 LAB — COMPREHENSIVE METABOLIC PANEL
ALT: 22 U/L (ref 0–44)
AST: 24 U/L (ref 15–41)
Albumin: 4.2 g/dL (ref 3.5–5.0)
Alkaline Phosphatase: 42 U/L (ref 38–126)
Anion gap: 10 (ref 5–15)
BUN: 16 mg/dL (ref 6–20)
CO2: 26 mmol/L (ref 22–32)
Calcium: 9.2 mg/dL (ref 8.9–10.3)
Chloride: 103 mmol/L (ref 98–111)
Creatinine, Ser: 0.74 mg/dL (ref 0.44–1.00)
GFR calc Af Amer: >60 mL/min (ref >60)
GFR calc non Af Amer: >60 mL/min (ref >60)
Glucose, Bld: 114 mg/dL — ABNORMAL HIGH (ref 70–99)
Potassium: 3.5 mmol/L (ref 3.5–5.1)
Sodium: 139 mmol/L (ref 135–145)
Total Bilirubin: 0.6 mg/dL (ref 0.3–1.2)
Total Protein: 6.8 g/dL (ref 6.5–8.1)

## 2018-09-02 LAB — URINALYSIS, ROUTINE W REFLEX MICROSCOPIC
Bilirubin Urine: NEGATIVE
Glucose, UA: NEGATIVE mg/dL
Ketones, ur: 5 mg/dL — AB
Leukocytes,Ua: NEGATIVE
Nitrite: NEGATIVE
Protein, ur: NEGATIVE mg/dL
Specific Gravity, Urine: 1.005 (ref 1.005–1.030)
pH: 5 (ref 5.0–8.0)

## 2018-09-02 LAB — MAGNESIUM: Magnesium: 2.1 mg/dL (ref 1.7–2.4)

## 2018-09-02 LAB — LIPASE, BLOOD: Lipase: 20 U/L (ref 11–51)

## 2018-09-02 LAB — PHOSPHORUS: Phosphorus: 3.8 mg/dL (ref 2.5–4.6)

## 2018-09-02 LAB — PREGNANCY, URINE: Preg Test, Ur: NEGATIVE

## 2018-09-02 MED ORDER — ONDANSETRON HCL 4 MG PO TABS: 4.0000 mg | ORAL_TABLET | Freq: Four times a day (QID) | ORAL | Status: DC | PRN

## 2018-09-02 MED ORDER — MORPHINE SULFATE (PF) 2 MG/ML IV SOLN: 2.0000 mg | INTRAVENOUS | Status: DC | PRN

## 2018-09-02 MED ORDER — ENOXAPARIN SODIUM 40 MG/0.4ML ~~LOC~~ SOLN: 40.0000 mg | SUBCUTANEOUS | Status: DC

## 2018-09-02 MED ORDER — ACETAMINOPHEN 650 MG RE SUPP: 650.0000 mg | Freq: Four times a day (QID) | RECTAL | Status: DC | PRN

## 2018-09-02 MED ORDER — MORPHINE SULFATE (PF) 4 MG/ML IV SOLN
4.0000 mg | Freq: Once | INTRAVENOUS | Status: AC
  Administered 2018-09-02: 4 mg via INTRAVENOUS
  Filled 2018-09-02: qty 1

## 2018-09-02 MED ORDER — MORPHINE SULFATE (PF) 4 MG/ML IV SOLN
4.0000 mg | INTRAVENOUS | Status: DC | PRN
  Administered 2018-09-03 (×2): 4 mg via INTRAVENOUS
  Filled 2018-09-02 (×2): qty 1

## 2018-09-02 MED ORDER — SODIUM CHLORIDE 0.9 % IV BOLUS
1000.0000 mL | Freq: Once | INTRAVENOUS | Status: AC
  Administered 2018-09-02: 1000 mL via INTRAVENOUS

## 2018-09-02 MED ORDER — ONDANSETRON HCL 4 MG/2ML IJ SOLN
4.0000 mg | Freq: Once | INTRAMUSCULAR | Status: AC
  Administered 2018-09-02: 4 mg via INTRAVENOUS
  Filled 2018-09-02: qty 2

## 2018-09-02 MED ORDER — ACETAMINOPHEN 325 MG PO TABS: 650.0000 mg | ORAL_TABLET | Freq: Four times a day (QID) | ORAL | Status: DC | PRN

## 2018-09-02 MED ORDER — SODIUM CHLORIDE 0.9 % IV SOLN
INTRAVENOUS | Status: DC
  Administered 2018-09-02: 23:00:00 via INTRAVENOUS

## 2018-09-02 MED ORDER — HYDROMORPHONE HCL 1 MG/ML IJ SOLN: 1.0000 mg | Freq: Once | INTRAMUSCULAR | Status: DC

## 2018-09-02 MED ORDER — ONDANSETRON HCL 4 MG/2ML IJ SOLN
4.0000 mg | Freq: Four times a day (QID) | INTRAMUSCULAR | Status: DC | PRN
  Administered 2018-09-03: 4 mg via INTRAVENOUS
  Filled 2018-09-02: qty 2

## 2018-09-02 MED ORDER — IOHEXOL 300 MG/ML  SOLN
80.0000 mL | Freq: Once | INTRAMUSCULAR | Status: AC | PRN
  Administered 2018-09-02: 80 mL via INTRAVENOUS

## 2018-09-02 MED ORDER — LORAZEPAM 2 MG/ML IJ SOLN
0.5000 mg | Freq: Once | INTRAMUSCULAR | Status: AC
  Administered 2018-09-02: 0.5 mg via INTRAVENOUS
  Filled 2018-09-02: qty 1

## 2018-09-02 NOTE — ED Provider Notes (Signed)
Medical screening examination/treatment/procedure(s) were conducted as a shared visit with non-physician practitioner(s) and myself.  I personally evaluated the patient during the encounter.  Clinical Impression:   Final diagnoses:  Small bowel obstruction ([place])    This patient presents with abdominal discomfort, she has had some diarrhea, she has had some nausea but no vomiting.  On exam she has tenderness in the mid abdomen with mild distention, decreased bowel sounds and on work-up she has no leukocytosis, no anemia, normal metabolic panel and lipase.  Her history is significant for prior bowel surgery for cecal volvulus, appears to have transition point at anastomosis.  Will discuss with surgery, likely admit with NG tube.  I discussed the findings with the patient as well as the plan and she is agreeable   Noemi Chapel, MD 09/02/18 2336

## 2018-09-02 NOTE — Telephone Encounter (Signed)
Patient called requesting an appointment. Explained to patient to contact her PCP to be seen and to get a referral. Patient agreed. MD notified.

## 2018-09-02 NOTE — ED Notes (Signed)
Attempted NG tube. Size 16 was tube large for nasal cavity. Size 12 coiled in nasal cavity. No size 14 available. Pt states unable to tolerate another attempt.

## 2018-09-02 NOTE — ED Triage Notes (Signed)
Patient presents to the ED with abdominal pain since 0100, using pepto bismol with no relief.  Patient states she has nausea with no vomiting.  She has had 10 episodes of diarrhea since 1 am. Patient had had similar symptoms one week ago.  Patient denies urinary symptoms.

## 2018-09-02 NOTE — ED Notes (Signed)
ED TO INPATIENT HANDOFF REPORT  ED Nurse Name and Phone #: Antony Blackbird RN [phone]  S Name/Age/Gender Catherine Short 58 y.o. female Room/Bed: APA06/APA06  Code Status   Code Status: Prior  Home/SNF/Other Home Patient oriented to: self, place, time and situation Is this baseline? Yes   Triage Complete: Triage complete  Chief Complaint ABD PAIN  Triage Note Patient presents to the ED with abdominal pain since 0100, using pepto bismol with no relief.  Patient states she has nausea with no vomiting.  She has had 10 episodes of diarrhea since 1 am. Patient had had similar symptoms one week ago.  Patient denies urinary symptoms.     Allergies Allergies  Allergen Reactions  . Cyclobenzaprine Hcl     Altered mental status    Level of Care/Admitting Diagnosis ED Disposition    ED Disposition Condition [facility]: [facility] [id number]  Level of Care: Med-Surg [16]  Covid Evaluation: Asymptomatic Screening Protocol (No Symptoms)  Diagnosis: Small bowel obstruction [id number]  Admitting Physician: Rolla Plate [id number]  Attending Physician: Rolla Plate [id number]  Estimated length of stay: 3 - 4 days  Certification:: I certify this patient will need inpatient services for at least 2 midnights  PT Class (Do Not Modify): Inpatient [101]  PT Acc Code (Do Not Modify): Private [1]       B Medical/Surgery History Past Medical History:  Diagnosis Date  . Anxiety   . Chronic constipation   . Colitis   . Depression   . Fibroid   . H/O cold sores   . Hormone replacement therapy (HRT) 11/14/2014  . Hot flashes 11/14/2014  . Increased endometrial stripe thickness 05/30/2015   Will get biopsy  . Insomnia   . Liver cyst   . Menopause 11/14/2014  . [place] 05/23/2015  . Nerves 11/14/2014  . Night sweats 11/14/2014  . PMB (postmenopausal bleeding) 05/23/2015  . Prolactinoma ([place])   . Rectocele 05/23/2015   Past Surgical History:   Procedure Laterality Date  . ABDOMINAL SURGERY    . AUGMENTATION MAMMAPLASTY  2000   saline implants  . BREAST ENHANCEMENT SURGERY  1996  . CARPAL TUNNEL RELEASE    . LAPAROSCOPIC RIGHT HEMI COLECTOMY Right 02/27/2018   Procedure: LAPAROSCOPIC RIGHT HEMI COLECTOMY;  Surgeon: [name] Signs, MD;  Location: [facility];  Service: General;  Laterality: Right;  . LAPAROTOMY N/A 02/27/2018   Procedure: EXPLORATORY LAPAROTOMY;  Surgeon: [name] Signs, MD;  Location: [facility];  Service: General;  Laterality: N/A;  . TONSILECTOMY, ADENOIDECTOMY, BILATERAL MYRINGOTOMY AND TUBES       A IV Location/Drains/Wounds Patient Lines/Drains/Airways Status   Active Line/Drains/Airways    Name:   Placement date:   Placement time:   Site:   Days:   Peripheral IV 09/02/18 Left Antecubital   09/02/18    1820    Antecubital   less than 1          Intake/Output Last 24 hours  Intake/Output Summary (Last 24 hours) at 09/02/2018 2142 Last data filed at 09/02/2018 1935 Gross per 24 hour  Intake 1000 ml  Output -  Net 1000 ml    Labs/Imaging Results for orders placed or performed during the hospital encounter of 09/02/18 (from the past 48 hour(s))  Urinalysis, Routine w reflex microscopic     Status: Abnormal   Collection Time: 09/02/18  2:59 PM  Result Value Ref Range   Color, Urine STRAW (A) YELLOW  APPearance CLEAR CLEAR   Specific Gravity, Urine 1.005 1.005 - 1.030   pH 5.0 5.0 - 8.0   Glucose, UA NEGATIVE NEGATIVE mg/dL   Hgb urine dipstick SMALL (A) NEGATIVE   Bilirubin Urine NEGATIVE NEGATIVE   Ketones, ur 5 (A) NEGATIVE mg/dL   Protein, ur NEGATIVE NEGATIVE mg/dL   Nitrite NEGATIVE NEGATIVE   Leukocytes,Ua NEGATIVE NEGATIVE   RBC / HPF 0-5 0 - 5 RBC/hpf   WBC, UA 0-5 0 - 5 WBC/hpf   Bacteria, UA RARE (A) NONE SEEN   Squamous Epithelial / LPF 0-5 0 - 5    Comment: Performed at [facility], [address], [place], [place] [address]  Pregnancy, urine     Status: None   Collection Time:  09/02/18  3:05 PM  Result Value Ref Range   Preg Test, Ur NEGATIVE NEGATIVE    Comment:        THE SENSITIVITY OF THIS METHODOLOGY IS >20 mIU/mL. Performed at [facility], [address], [place], [place] [address]   CBC with Differential     Status: None   Collection Time: 09/02/18  6:05 PM  Result Value Ref Range   WBC 9.0 4.0 - 10.5 K/uL   RBC 4.50 3.87 - 5.11 MIL/uL   Hemoglobin 14.1 12.0 - 15.0 g/dL   HCT 42.3 36.0 - 46.0 %   MCV 94.0 80.0 - 100.0 fL   MCH 31.3 26.0 - 34.0 pg   MCHC 33.3 30.0 - 36.0 g/dL   RDW 14.6 11.5 - 15.5 %   Platelets 244 150 - 400 K/uL   nRBC 0.0 0.0 - 0.2 %   Neutrophils Relative % 72 %   Neutro Abs 6.3 1.7 - 7.7 K/uL   Lymphocytes Relative 19 %   Lymphs Abs 1.7 0.7 - 4.0 K/uL   Monocytes Relative 8 %   Monocytes Absolute 0.7 0.1 - 1.0 K/uL   Eosinophils Relative 1 %   Eosinophils Absolute 0.1 0.0 - 0.5 K/uL   Basophils Relative 0 %   Basophils Absolute 0.0 0.0 - 0.1 K/uL   Immature Granulocytes 0 %   Abs Immature Granulocytes 0.04 0.00 - 0.07 K/uL    Comment: Performed at [facility], [address] [place], [place] [address]  Comprehensive metabolic panel     Status: Abnormal   Collection Time: 09/02/18  6:05 PM  Result Value Ref Range   Sodium 139 135 - 145 mmol/L   Potassium 3.5 3.5 - 5.1 mmol/L   Chloride 103 98 - 111 mmol/L   CO2 26 22 - 32 mmol/L   Glucose, Bld 114 (H) 70 - 99 mg/dL   BUN 16 6 - 20 mg/dL   Creatinine, Ser 0.74 0.44 - 1.00 mg/dL   Calcium 9.2 8.9 - 10.3 mg/dL   Total Protein 6.8 6.5 - 8.1 g/dL   Albumin 4.2 3.5 - 5.0 g/dL   AST 24 15 - 41 U/L   ALT 22 0 - 44 U/L   Alkaline Phosphatase 42 38 - 126 U/L   Total Bilirubin 0.6 0.3 - 1.2 mg/dL   GFR calc non Af Amer >60 >60 mL/min   GFR calc Af Amer >60 >60 mL/min   Anion gap 10 5 - 15    Comment: Performed at [facility], [address] [place], [place] [address]  Lipase, blood     Status: None   Collection Time: 09/02/18  6:05 PM  Result Value Ref Range    Lipase 20 11 -  51 U/L    Comment: Performed at [facility], [address], [place], [place] [address]   Ct Abdomen Pelvis W Contrast  Result Date: 09/02/2018 CLINICAL DATA:  58 year old female with acute abdominal and pelvic pain, nausea and diarrhea for 1 day. History of RIGHT hemicolectomy. EXAM: CT ABDOMEN AND PELVIS WITH CONTRAST TECHNIQUE: Multidetector CT imaging of the abdomen and pelvis was performed using the standard protocol following bolus administration of intravenous contrast. CONTRAST:  8mL OMNIPAQUE IOHEXOL 300 MG/ML  SOLN COMPARISON:  04/30/2018 FINDINGS: Lower chest: No acute abnormality. Hepatobiliary: The liver is unremarkable except for a RIGHT hepatic cyst and perfusion abnormality. The gallbladder is unremarkable. No biliary dilatation. Pancreas: Unremarkable Spleen: Unremarkable Adrenals/Urinary Tract: The kidneys, adrenal glands and bladder are unremarkable. Stomach/Bowel: Dilated mid and distal small bowel loops are noted with collapsed mid and distal colon. This is compatible with a high-grade small bowel obstruction with a transition point at or near the colonic anastomotic site. A small amount of interloop fluid is noted within the RIGHT abdomen. There is no evidence of pneumoperitoneum or abscess. Vascular/Lymphatic: No significant vascular findings are present. No enlarged abdominal or pelvic lymph nodes. Reproductive: Uterus and bilateral adnexa are unremarkable. Other: None Musculoskeletal: No acute or suspicious bony abnormalities. Grade 1 spondylolisthesis and moderate degenerative disc disease at L2-3 again noted. IMPRESSION: 1. High-grade small bowel obstruction with transition point at or near the colonic anastomotic site. Small amount of interloop fluid within the RIGHT abdomen. No evidence of pneumoperitoneum. 2. No other acute abnormality. 3. Grade 1 spondylolisthesis and moderate degenerative disc disease at L2-3. Electronically Signed   By: Margarette Canada M.D.   On:  09/02/2018 19:06    Pending Labs Unresulted Labs (From admission, onward)    Start     Ordered   09/02/18 1912  SARS CORONAVIRUS 2 (TAT 6-12 HRS) Nasal Swab Aptima Multi Swab  (Asymptomatic/Tier 2 Patients Labs)  Once,   STAT    Question Answer Comment  Is this test for diagnosis or screening Screening   Symptomatic for COVID-19 as defined by CDC No   Hospitalized for COVID-19 No   Admitted to ICU for COVID-19 No   Previously tested for COVID-19 No   Resident in a congregate (group) care setting No   Employed in healthcare setting No   Pregnant No      09/02/18 1911   Signed and Held  HIV antibody (Routine Testing)  Once,   R     Signed and Held   Signed and Held  CBC  (enoxaparin (LOVENOX)    CrCl >/= 30 ml/min)  Once,   R    Comments: Baseline for enoxaparin therapy IF NOT ALREADY DRAWN.  Notify MD if PLT < 100 K.    Signed and Held   Signed and Held  Creatinine, serum  (enoxaparin (LOVENOX)    CrCl >/= 30 ml/min)  Once,   R    Comments: Baseline for enoxaparin therapy IF NOT ALREADY DRAWN.    Signed and Held   Signed and Held  Creatinine, serum  (enoxaparin (LOVENOX)    CrCl >/= 30 ml/min)  Weekly,   R    Comments: while on enoxaparin therapy    Signed and Held   Signed and Held  Magnesium  Add-on,   R     Signed and Held   Signed and Held  Phosphorus  Add-on,   R     Signed and Held   Signed and Held  TSH  Once,   R     Signed and Held   Signed and Held  Comprehensive metabolic panel  Tomorrow morning,   R     Signed and Held   Signed and Held  CBC  Tomorrow morning,   R     Signed and Held          Vitals/Pain Today's Vitals   09/02/18 1446 09/02/18 1447 09/02/18 1936 09/02/18 2003  BP: 106/76   111/70  Pulse: 68   68  Resp: 15   14  Temp: 98.6 F (37 C)     TempSrc: Oral     SpO2: 99%   100%  Weight:  59 kg    Height:  5\' 3"  (1.6 m)    PainSc: 7   5  4      Isolation Precautions No active isolations  Medications Medications  LORazepam (ATIVAN)  injection 0.5 mg (has no administration in time range)  morphine 2 MG/ML injection 2 mg (has no administration in time range)  0.9 %  sodium chloride infusion (has no administration in time range)  sodium chloride 0.9 % bolus 1,000 mL (0 mLs Intravenous Stopped 09/02/18 1935)  ondansetron (ZOFRAN) injection 4 mg (4 mg Intravenous Given 09/02/18 1825)  morphine 4 MG/ML injection 4 mg (4 mg Intravenous Given 09/02/18 1825)  iohexol (OMNIPAQUE) 300 MG/ML solution 80 mL (80 mLs Intravenous Contrast Given 09/02/18 1848)  morphine 4 MG/ML injection 4 mg (4 mg Intravenous Given 09/02/18 2138)    Mobility walks Low fall risk   Focused Assessments    R Recommendations: See Admitting Provider Note  Report given to:   Additional Notes:

## 2018-09-02 NOTE — H&P (Signed)
TRH H&P    Patient Demographics:    Catherine Short, is a 58 y.o. female  MRN: [id number]  DOB - [birth year]  Admit Date - 09/02/2018  Referring MD/NP/PA: Kennith Maes  Outpatient Primary MD for the patient is Patient, No Pcp Per  Patient coming from: Home    Chief complaint- Abdominal pain   HPI:    Catherine Short  is a 58 y.o. female, with history significant for cecal volvulus and right hemicolectomy presents to the ED today for abdominal pain. Patient reports that her pain woke her out of sleep this morning at 1:00am. It was a 7/10. It is characterized as an intense pressure and ache. It radiates from below her umbilicus into her chest. The pain waxes and wanes. It is constantly present midline below her umbilicus. It is worse with palpation. It has "waves" of worsening pain that are unpredictable. Pain medication (morphine and dilaudid) in the ER made the pain better. At this moment it is a 4/10. Patient reports multiple episodes of small quantity diarrhea. She has not seen any bright red blood. She reports associated nausea without vomiting. She experienced the same set of symptoms 6 days ago, but at that time they spontaneously resolved. Patient reports no fevers at home. Last food items were pretzels 4 hours ago, and toast for breakfast. Last normal meal was dinner last night. Patient has not had any associated gyn symptoms or urinary symptoms.   ED course: Blood work in the ED revealed normal electrolytes with low normal potassium. Glucose 114 - post snack. Hematology revealed no leukocytosis. Pregnancy test negative. Lipase 20. CT scan revealed: 1. High-grade small bowel obstruction with transition point at or near the colonic anastomotic site. Small amount of interloop fluid within the RIGHT abdomen. No evidence of pneumoperitoneum. 2. No other acute abnormality. 3. Grade 1 spondylolisthesis and  moderate degenerative disc disease at L2-3. Covid test is pending.     Review of systems:    In addition to the HPI above,  No Fever-chills, No Headache, No changes with Vision or hearing, No problems swallowing food or Liquids, No Chest pain, Cough or Shortness of Breath, Positive for abdominal pain, nausea, both diarrhea and constipation. No vomiting No Blood in stool or Urine, No dysuria, No new skin rashes or bruises, Positive for chronic low back pain No new weakness, tingling, numbness in any extremity, No recent weight gain or loss, No polyuria, polydypsia or polyphagia, No significant Mental Stressors.  All other systems reviewed and are negative.    Past History of the following :    Past Medical History:  Diagnosis Date   Anxiety    Chronic constipation    Colitis    Depression    Fibroid    H/O cold sores    Hormone replacement therapy (HRT) 11/14/2014   Hot flashes 11/14/2014   Increased endometrial stripe thickness 05/30/2015   Will get biopsy   Insomnia    Liver cyst    Menopause 11/14/2014   [place] 05/23/2015   Nerves 11/14/2014   Night  sweats 11/14/2014   PMB (postmenopausal bleeding) 05/23/2015   Prolactinoma ([place])    Rectocele 05/23/2015      Past Surgical History:  Procedure Laterality Date   ABDOMINAL SURGERY     AUGMENTATION MAMMAPLASTY  2000   saline implants   BREAST ENHANCEMENT SURGERY  1996   CARPAL TUNNEL RELEASE     LAPAROSCOPIC RIGHT HEMI COLECTOMY Right 02/27/2018   Procedure: LAPAROSCOPIC RIGHT HEMI COLECTOMY;  Surgeon: [name] Signs, MD;  Location: [facility];  Service: General;  Laterality: Right;   LAPAROTOMY N/A 02/27/2018   Procedure: EXPLORATORY LAPAROTOMY;  Surgeon: [name] Signs, MD;  Location: [facility];  Service: General;  Laterality: N/A;   TONSILECTOMY, ADENOIDECTOMY, BILATERAL MYRINGOTOMY AND TUBES        Social History:      Social History   Tobacco Use   Smoking status: Former Smoker     Years: 6.00    Types: Cigarettes   Smokeless tobacco: Never Used  Substance Use Topics   Alcohol use: Yes    Alcohol/week: 2.0 standard drinks    Types: 2 Glasses of wine per week    Comment: twice a week       Family History :     Family History  Problem Relation Age of Onset   Other Mother        digestive type issues   Diabetes Mother    Hypertension Mother    Heart attack Father    Heart disease Father    Other Brother        MVA   Cancer Maternal Grandmother        uterine   Emphysema Maternal Grandfather    Alcohol abuse Brother    Cancer Paternal Grandfather        colon      Home Medications:   Prior to Admission medications   Medication Sig Start Date End Date Taking? Authorizing Provider  ASHWAGANDHA PO Take 1 tablet by mouth daily.    [provider]  Cholecalciferol (VITAMIN D-3 PO) Take 400 Units by mouth daily.     [provider]  COLLAGEN PO Take 1 tablet by mouth daily.     [provider]  dicyclomine (BENTYL) 20 MG tablet Take 1 tablet (20 mg total) by mouth 3 (three) times daily before meals. 03/28/18   [name] Signs, MD  GLUTATHIONE PO Take 500 mg by mouth daily.    [provider]  HYDROcodone-acetaminophen (NORCO/VICODIN) 5-325 MG tablet Take 2 tablets by mouth every 4 (four) hours as needed. 04/30/18   Triplett, Tammy, PA-C  HYDROcodone-acetaminophen (NORCO/VICODIN) 5-325 MG tablet Take one tab po q 4 hrs prn pain 04/30/18   Triplett, Tammy, PA-C  Naproxen Sodium (ALEVE) 220 MG CAPS Take 1 capsule by mouth every morning.     [provider]  Omega-3 Fatty Acids (FISH OIL PO) Take 1 capsule by mouth every evening. DHA and EFA-daily     [provider]  vitamin E 400 UNIT capsule Take 400 Units by mouth daily.     [provider]     Allergies:     Allergies  Allergen Reactions   Cyclobenzaprine Hcl     Altered mental status     Physical Exam:   Vitals  Blood  pressure 111/70, pulse 68, temperature 98.6 F (37 C), temperature source Oral, resp. rate 14, height 5\' 3"  (1.6 m), weight 59 kg, last menstrual period 07/05/2016, SpO2 100 %.  1.  General: Supine in bed  in no acute distress  2. Psychiatric: Pleasant, cooperative, alert, and oriented.   3. Neurologic: Cranial nerves II-XII grossly intact. Patient moves all 4 extremities voluntarily. No focal deficits on limited exam.    4. HEENMT:  Atraumatic normocephalic. Pupils reactive to light. No neck masses. Trachea midline.   5. Respiratory : LCTABL  6. Cardiovascular : HRRR  7. Gastrointestinal:  Non distended, soft, tender to palpation diffusely. Most exquisitely tender midline below umbilicus. Bowel sounds normoactive.   8. Skin:  No rashes or lesions on limited skin exam.   9.Musculoskeletal:  Left side lower back tender to palpation at thoracolumbar junction.     Data Review:    CBC Recent Labs  Lab 09/02/18 1805  WBC 9.0  HGB 14.1  HCT 42.3  PLT 244  MCV 94.0  MCH 31.3  MCHC 33.3  RDW 14.6  LYMPHSABS 1.7  MONOABS 0.7  EOSABS 0.1  BASOSABS 0.0   ------------------------------------------------------------------------------------------------------------------  Results for orders placed or performed during the hospital encounter of 09/02/18 (from the past 48 hour(s))  Urinalysis, Routine w reflex microscopic     Status: Abnormal   Collection Time: 09/02/18  2:59 PM  Result Value Ref Range   Color, Urine STRAW (A) YELLOW   APPearance CLEAR CLEAR   Specific Gravity, Urine 1.005 1.005 - 1.030   pH 5.0 5.0 - 8.0   Glucose, UA NEGATIVE NEGATIVE mg/dL   Hgb urine dipstick SMALL (A) NEGATIVE   Bilirubin Urine NEGATIVE NEGATIVE   Ketones, ur 5 (A) NEGATIVE mg/dL   Protein, ur NEGATIVE NEGATIVE mg/dL   Nitrite NEGATIVE NEGATIVE   Leukocytes,Ua NEGATIVE NEGATIVE   RBC / HPF 0-5 0 - 5 RBC/hpf   WBC, UA 0-5 0 - 5 WBC/hpf   Bacteria, UA RARE (A) NONE SEEN    Squamous Epithelial / LPF 0-5 0 - 5    Comment: Performed at [facility], [address], [place], [place] [address]  Pregnancy, urine     Status: None   Collection Time: 09/02/18  3:05 PM  Result Value Ref Range   Preg Test, Ur NEGATIVE NEGATIVE    Comment:        THE SENSITIVITY OF THIS METHODOLOGY IS >20 mIU/mL. Performed at [facility], [address], [place], [place] [address]   CBC with Differential     Status: None   Collection Time: 09/02/18  6:05 PM  Result Value Ref Range   WBC 9.0 4.0 - 10.5 K/uL   RBC 4.50 3.87 - 5.11 MIL/uL   Hemoglobin 14.1 12.0 - 15.0 g/dL   HCT 42.3 36.0 - 46.0 %   MCV 94.0 80.0 - 100.0 fL   MCH 31.3 26.0 - 34.0 pg   MCHC 33.3 30.0 - 36.0 g/dL   RDW 14.6 11.5 - 15.5 %   Platelets 244 150 - 400 K/uL   nRBC 0.0 0.0 - 0.2 %   Neutrophils Relative % 72 %   Neutro Abs 6.3 1.7 - 7.7 K/uL   Lymphocytes Relative 19 %   Lymphs Abs 1.7 0.7 - 4.0 K/uL   Monocytes Relative 8 %   Monocytes Absolute 0.7 0.1 - 1.0 K/uL   Eosinophils Relative 1 %   Eosinophils Absolute 0.1 0.0 - 0.5 K/uL   Basophils Relative 0 %   Basophils Absolute 0.0 0.0 - 0.1 K/uL   Immature Granulocytes 0 %   Abs Immature Granulocytes 0.04 0.00 - 0.07 K/uL    Comment: Performed at [facility], [address]  [place], [place] [address]  Comprehensive metabolic panel     Status: Abnormal   Collection Time: 09/02/18  6:05 PM  Result Value Ref Range   Sodium 139 135 - 145 mmol/L   Potassium 3.5 3.5 - 5.1 mmol/L   Chloride 103 98 - 111 mmol/L   CO2 26 22 - 32 mmol/L   Glucose, Bld 114 (H) 70 - 99 mg/dL   BUN 16 6 - 20 mg/dL   Creatinine, Ser 0.74 0.44 - 1.00 mg/dL   Calcium 9.2 8.9 - 10.3 mg/dL   Total Protein 6.8 6.5 - 8.1 g/dL   Albumin 4.2 3.5 - 5.0 g/dL   AST 24 15 - 41 U/L   ALT 22 0 - 44 U/L   Alkaline Phosphatase 42 38 - 126 U/L   Total Bilirubin 0.6 0.3 - 1.2 mg/dL   GFR calc non Af Amer >60 >60 mL/min   GFR calc Af Amer >60 >60 mL/min   Anion gap 10 5 - 15     Comment: Performed at [facility], [address] [place], [place] [address]  Lipase, blood     Status: None   Collection Time: 09/02/18  6:05 PM  Result Value Ref Range   Lipase 20 11 - 51 U/L    Comment: Performed at [facility], [address], [place], [place] [address]    Chemistries  Recent Labs  Lab 09/02/18 1805  NA 139  K 3.5  CL 103  CO2 26  GLUCOSE 114*  BUN 16  CREATININE 0.74  CALCIUM 9.2  AST 24  ALT 22  ALKPHOS 42  BILITOT 0.6   ------------------------------------------------------------------------------------------------------------------  ------------------------------------------------------------------------------------------------------------------ GFR: Estimated Creatinine Clearance: 65 mL/min (by C-G formula based on SCr of 0.74 mg/dL). Liver Function Tests: Recent Labs  Lab 09/02/18 1805  AST 24  ALT 22  ALKPHOS 42  BILITOT 0.6  PROT 6.8  ALBUMIN 4.2   Recent Labs  Lab 09/02/18 1805  LIPASE 20   No results for input(s): AMMONIA in the last 168 hours. Coagulation Profile: No results for input(s): INR, PROTIME in the last 168 hours. Cardiac Enzymes: No results for input(s): CKTOTAL, CKMB, CKMBINDEX, TROPONINI in the last 168 hours. BNP (last 3 results) No results for input(s): PROBNP in the last 8760 hours. HbA1C: No results for input(s): HGBA1C in the last 72 hours. CBG: No results for input(s): GLUCAP in the last 168 hours. Lipid Profile: No results for input(s): CHOL, HDL, LDLCALC, TRIG, CHOLHDL, LDLDIRECT in the last 72 hours. Thyroid Function Tests: No results for input(s): TSH, T4TOTAL, FREET4, T3FREE, THYROIDAB in the last 72 hours. Anemia Panel: No results for input(s): VITAMINB12, FOLATE, FERRITIN, TIBC, IRON, RETICCTPCT in the last 72 hours.  --------------------------------------------------------------------------------------------------------------- Urine analysis:    Component Value Date/Time   COLORURINE STRAW  (A) 09/02/2018 1459   APPEARANCEUR CLEAR 09/02/2018 1459   LABSPEC 1.005 09/02/2018 1459   PHURINE 5.0 09/02/2018 1459   GLUCOSEU NEGATIVE 09/02/2018 1459   HGBUR SMALL (A) 09/02/2018 1459   BILIRUBINUR NEGATIVE 09/02/2018 1459   KETONESUR 5 (A) 09/02/2018 1459   PROTEINUR NEGATIVE 09/02/2018 1459   NITRITE NEGATIVE 09/02/2018 1459   LEUKOCYTESUR NEGATIVE 09/02/2018 1459      Imaging Results:    Ct Abdomen Pelvis W Contrast  Result Date: 09/02/2018 CLINICAL DATA:  58 year old female with acute abdominal and pelvic pain, nausea and diarrhea for 1 day. History of RIGHT hemicolectomy. EXAM: CT ABDOMEN AND PELVIS WITH CONTRAST TECHNIQUE: Multidetector CT imaging of the abdomen and pelvis  was performed using the standard protocol following bolus administration of intravenous contrast. CONTRAST:  28mL OMNIPAQUE IOHEXOL 300 MG/ML  SOLN COMPARISON:  04/30/2018 FINDINGS: Lower chest: No acute abnormality. Hepatobiliary: The liver is unremarkable except for a RIGHT hepatic cyst and perfusion abnormality. The gallbladder is unremarkable. No biliary dilatation. Pancreas: Unremarkable Spleen: Unremarkable Adrenals/Urinary Tract: The kidneys, adrenal glands and bladder are unremarkable. Stomach/Bowel: Dilated mid and distal small bowel loops are noted with collapsed mid and distal colon. This is compatible with a high-grade small bowel obstruction with a transition point at or near the colonic anastomotic site. A small amount of interloop fluid is noted within the RIGHT abdomen. There is no evidence of pneumoperitoneum or abscess. Vascular/Lymphatic: No significant vascular findings are present. No enlarged abdominal or pelvic lymph nodes. Reproductive: Uterus and bilateral adnexa are unremarkable. Other: None Musculoskeletal: No acute or suspicious bony abnormalities. Grade 1 spondylolisthesis and moderate degenerative disc disease at L2-3 again noted. IMPRESSION: 1. High-grade small bowel obstruction with  transition point at or near the colonic anastomotic site. Small amount of interloop fluid within the RIGHT abdomen. No evidence of pneumoperitoneum. 2. No other acute abnormality. 3. Grade 1 spondylolisthesis and moderate degenerative disc disease at L2-3. Electronically Signed   By: Margarette Canada M.D.   On: 09/02/2018 19:06       Assessment & Plan:    Active Problems:   Small bowel obstruction ([place])   1. Small bowel obstruction 1. Gen surgery consulted - appreciate recommendation 2. Defer to gen surg re: possible gastrographin treatment 3. NG tube ordered - one time dose of ativan for NG placement 4. NPO ordered.  5. Maintenance fluids.  6. 4mg  morphine Q4 hours PRN for severe pain 7. 3mg  morphine Q3 hours for moderate pain.  8. KUB in the morning to assess progress 2. Chronic Constipation 1. Check TSH 2. Advised out patient follow up 3. No bowel regimen at this time 3. Seasonal allergies 1. Holding PO meds. 2. Continue when patient is advanced to liquids.  4.    DVT Prophylaxis-   Lovenox - SCDs  AM Labs Ordered, also please review Full Orders  Family Communication: Admission, patients condition and plan of care including tests being ordered have been discussed with the patient and husband who indicate understanding and agree with the plan and Code Status.  Code Status:  Full  Admission status: Observation/Inpatient: Based on patients clinical presentation and evaluation of above clinical data, I have made determination that patient meets Inpatient criteria at this time.  Time spent in minutes : 64   Rolla Plate M.D on 09/02/2018 at 8:43 PM

## 2018-09-02 NOTE — ED Provider Notes (Signed)
[facility] EMERGENCY DEPARTMENT Provider Note   CSN: [id number] Arrival date & time: 09/02/18  1412     History   Chief Complaint Chief Complaint  Patient presents with  . Abdominal Pain    HPI Catherine Short is a 58 y.o. female with a history of anxiety, depression, chronic constipation, prior colitis, prior cecal volvulus, and prior abdominal surgeries including right hemicolectomy who presents to the emergency department with complaints of abdominal pain and diarrhea which began at 0100 this morning.  Patient states that she went to bed feeling fairly normal, woke from sleep with abdominal pain and diarrhea.  She states her abdominal discomfort is a cramping pain that is primarily to the lower abdomen and radiates into the epigastric area.  She states her current pain is a 6 out of 10 in severity, this waxes/wanes, no specific alleviating or aggravating factors.  She has had associated 10 episodes of nonbloody diarrhea, last episode was around 1:00 this afternoon.  She has felt a bit nauseated but has not had any episodes of vomiting.  She denies fever, hematemesis, melena, hematochezia, vaginal bleeding, vaginal discharge, or dysuria.  She states she had a similar episode of pain and diarrhea approximately 6 days ago that was less severe and spontaneously resolved.  She states she is concerned because this is how her prior volvulus started.     HPI  Past Medical History:  Diagnosis Date  . Anxiety   . Chronic constipation   . Colitis   . Depression   . Fibroid   . H/O cold sores   . Hormone replacement therapy (HRT) 11/14/2014  . Hot flashes 11/14/2014  . Increased endometrial stripe thickness 05/30/2015   Will get biopsy  . Insomnia   . Liver cyst   . Menopause 11/14/2014  . Moody 05/23/2015  . Nerves 11/14/2014  . Night sweats 11/14/2014  . PMB (postmenopausal bleeding) 05/23/2015  . Prolactinoma ([place])   . Rectocele 05/23/2015    Patient Active Problem List   Diagnosis  Date Noted  . Cecal volvulus ([place])   . Increased endometrial stripe thickness 05/30/2015  . Moody 05/23/2015  . PMB (postmenopausal bleeding) 05/23/2015  . Rectocele 05/23/2015  . Menopause 11/14/2014  . Hot flashes 11/14/2014  . Night sweats 11/14/2014  . Nerves 11/14/2014  . Hormone replacement therapy (HRT) 11/14/2014  . Unspecified constipation 11/14/2012  . Tennis elbow 10/30/2010  . Rotator cuff syndrome of left shoulder 10/30/2010  . Muscle spasms of neck 10/30/2010  . GERD 11/11/2009  . Microscopic colitis 11/11/2009    Past Surgical History:  Procedure Laterality Date  . ABDOMINAL SURGERY    . AUGMENTATION MAMMAPLASTY  2000   saline implants  . BREAST ENHANCEMENT SURGERY  1996  . CARPAL TUNNEL RELEASE    . LAPAROSCOPIC RIGHT HEMI COLECTOMY Right 02/27/2018   Procedure: LAPAROSCOPIC RIGHT HEMI COLECTOMY;  Surgeon: [name] Signs, MD;  Location: [facility];  Service: General;  Laterality: Right;  . LAPAROTOMY N/A 02/27/2018   Procedure: EXPLORATORY LAPAROTOMY;  Surgeon: [name] Signs, MD;  Location: [facility];  Service: General;  Laterality: N/A;  . TONSILECTOMY, ADENOIDECTOMY, BILATERAL MYRINGOTOMY AND TUBES       OB History    Gravida  2   Para  1   Term      Preterm      AB  1   Living  1     SAB  1   TAB      Ectopic  Multiple      Live Births               Home Medications    Prior to Admission medications   Medication Sig Start Date End Date Taking? Authorizing Provider  ASHWAGANDHA PO Take 1 tablet by mouth daily.    [provider]  Cholecalciferol (VITAMIN D-3 PO) Take 400 Units by mouth daily.     [provider]  COLLAGEN PO Take 1 tablet by mouth daily.     [provider]  dicyclomine (BENTYL) 20 MG tablet Take 1 tablet (20 mg total) by mouth 3 (three) times daily before meals. 03/28/18   [name] Signs, MD  GLUTATHIONE PO Take 500 mg by mouth daily.    [provider]  HYDROcodone-acetaminophen  (NORCO/VICODIN) 5-325 MG tablet Take 2 tablets by mouth every 4 (four) hours as needed. 04/30/18   Triplett, Tammy, PA-C  HYDROcodone-acetaminophen (NORCO/VICODIN) 5-325 MG tablet Take one tab po q 4 hrs prn pain 04/30/18   Triplett, Tammy, PA-C  Naproxen Sodium (ALEVE) 220 MG CAPS Take 1 capsule by mouth every morning.     [provider]  Omega-3 Fatty Acids (FISH OIL PO) Take 1 capsule by mouth every evening. DHA and EFA-daily     [provider]  vitamin E 400 UNIT capsule Take 400 Units by mouth daily.     [provider]    Family History Family History  Problem Relation Age of Onset  . Other Mother        digestive type issues  . Diabetes Mother   . Hypertension Mother   . Heart attack Father   . Heart disease Father   . Other Brother        MVA  . Cancer Maternal Grandmother        uterine  . Emphysema Maternal Grandfather   . Alcohol abuse Brother   . Cancer Paternal Grandfather        colon    Social History Social History   Tobacco Use  . Smoking status: Former Smoker    Years: 6.00    Types: Cigarettes  . Smokeless tobacco: Never Used  Substance Use Topics  . Alcohol use: Yes    Alcohol/week: 2.0 standard drinks    Types: 2 Glasses of wine per week    Comment: twice a week  . Drug use: No     Allergies   Cyclobenzaprine hcl   Review of Systems Review of Systems  Constitutional: Negative for fever.  Respiratory: Negative for cough and shortness of breath.   Cardiovascular: Negative for chest pain.  Gastrointestinal: Positive for abdominal pain, diarrhea and nausea. Negative for anal bleeding, blood in stool, constipation and vomiting.  Genitourinary: Negative for dysuria, vaginal bleeding and vaginal discharge.  All other systems reviewed and are negative.    Physical Exam Updated Vital Signs BP 106/76 (BP Location: Right Arm)   Pulse 68   Temp 98.6 F (37 C) (Oral)   Resp 15   Ht 5\' 3"  (1.6 m)   Wt 59 kg   LMP  07/05/2016 (Approximate)   SpO2 99%   BMI 23.03 kg/m   Physical Exam Vitals signs and nursing note reviewed.  Constitutional:      General: She is not in acute distress.    Appearance: She is well-developed. She is not toxic-appearing.  HENT:     Head: Normocephalic and atraumatic.  Eyes:     General:  Right eye: No discharge.        Left eye: No discharge.     Conjunctiva/sclera: Conjunctivae normal.  Neck:     Musculoskeletal: Neck supple.  Cardiovascular:     Rate and Rhythm: Normal rate and regular rhythm.  Pulmonary:     Effort: Pulmonary effort is normal. No respiratory distress.     Breath sounds: Normal breath sounds. No wheezing, rhonchi or rales.  Abdominal:     General: A surgical scar is present. Bowel sounds are normal. There is no distension.     Palpations: Abdomen is soft.     Tenderness: There is abdominal tenderness in the right lower quadrant, epigastric area and left lower quadrant. There is guarding (Mild voluntary). There is no rebound.  Skin:    General: Skin is warm and dry.     Findings: No rash.  Neurological:     Mental Status: She is alert.     Comments: Clear speech.   Psychiatric:        Behavior: Behavior normal.     ED Treatments / Results  Labs (all labs ordered are listed, but only abnormal results are displayed) Labs Reviewed  URINALYSIS, ROUTINE W REFLEX MICROSCOPIC - Abnormal; Notable for the following components:      Result Value   Color, Urine STRAW (*)    Hgb urine dipstick SMALL (*)    Ketones, ur 5 (*)    Bacteria, UA RARE (*)    All other components within normal limits  COMPREHENSIVE METABOLIC PANEL - Abnormal; Notable for the following components:   Glucose, Bld 114 (*)    All other components within normal limits  PREGNANCY, URINE  CBC WITH DIFFERENTIAL/PLATELET  LIPASE, BLOOD    EKG None  Radiology Ct Abdomen Pelvis W Contrast  Result Date: 09/02/2018 CLINICAL DATA:  58 year old female with acute  abdominal and pelvic pain, nausea and diarrhea for 1 day. History of RIGHT hemicolectomy. EXAM: CT ABDOMEN AND PELVIS WITH CONTRAST TECHNIQUE: Multidetector CT imaging of the abdomen and pelvis was performed using the standard protocol following bolus administration of intravenous contrast. CONTRAST:  74mL OMNIPAQUE IOHEXOL 300 MG/ML  SOLN COMPARISON:  04/30/2018 FINDINGS: Lower chest: No acute abnormality. Hepatobiliary: The liver is unremarkable except for a RIGHT hepatic cyst and perfusion abnormality. The gallbladder is unremarkable. No biliary dilatation. Pancreas: Unremarkable Spleen: Unremarkable Adrenals/Urinary Tract: The kidneys, adrenal glands and bladder are unremarkable. Stomach/Bowel: Dilated mid and distal small bowel loops are noted with collapsed mid and distal colon. This is compatible with a high-grade small bowel obstruction with a transition point at or near the colonic anastomotic site. A small amount of interloop fluid is noted within the RIGHT abdomen. There is no evidence of pneumoperitoneum or abscess. Vascular/Lymphatic: No significant vascular findings are present. No enlarged abdominal or pelvic lymph nodes. Reproductive: Uterus and bilateral adnexa are unremarkable. Other: None Musculoskeletal: No acute or suspicious bony abnormalities. Grade 1 spondylolisthesis and moderate degenerative disc disease at L2-3 again noted. IMPRESSION: 1. High-grade small bowel obstruction with transition point at or near the colonic anastomotic site. Small amount of interloop fluid within the RIGHT abdomen. No evidence of pneumoperitoneum. 2. No other acute abnormality. 3. Grade 1 spondylolisthesis and moderate degenerative disc disease at L2-3. Electronically Signed   By: Margarette Canada M.D.   On: 09/02/2018 19:06    Procedures Procedures (including critical care time)  Medications Ordered in ED Medications - No data to display   Initial Impression / Assessment and Plan /  ED Course  I have  reviewed the triage vital signs and the nursing notes.  Pertinent labs & imaging results that were available during my care of the patient were reviewed by me and considered in my medical decision making (see chart for details).    Patient presents to the ED with complaints of abdominal pain. Patient nontoxic appearing, in no apparent distress, vitals WNL. On exam patient tender to primarily to the bilateral lower quadrants, mild to epigastrium, mild voluntary guarding. Will evaluate with labs and CT A/P. Analgesics, anti-emetics, and fluids administered.   ER work-up reviewed:  CBC: No leukocytosis or anemia CMP: No significant electrolyte derangement.  LFTs and renal function are preserved Lipase: WNL UA: Small hemoglobin, rare bacteria, not consistent with UTI especially in the setting of no urinary symptoms Preg test: Negative.   CT A/P:  1. High-grade small bowel obstruction with transition point at or near the colonic anastomotic site. Small amount of interloop fluid within the RIGHT abdomen. No evidence of pneumoperitoneum. 2. No other acute abnormality. 3. Grade 1 spondylolisthesis and moderate degenerative disc disease at L2-3.  Patient w/ SBO- will order NG tube, discuss w/ general surgery, covid swab ordered.   19:27: CONSULT: Discussed with general surgeon Dr. Arnoldo Morale, in agreement w/ NG tube- requesting size 16, requesting medicine admission- he will see patient tomorrow AM.   Patient updated on results & plan of care, provided opportunity for questions- she is in agreement, requesting additional dose of pain medication prior to NG tube placement, initial pressure was a bit soft, 4 mg of morphine ordered, but discussed w/ nursing to repeat BP prior to administration to ensure she is not hypotensive.    19:59: CONSULT: Discussed with hospitalist Dr. Zierle-Ghosh-accepts admission.   Findings and plan of care discussed with supervising physician Dr. Sabra Heck who is in agreement.    ZADE TURNIER was evaluated in Emergency Department on 09/02/2018 for the symptoms described in the history of present illness. He/she was evaluated in the context of the global COVID-19 pandemic, which necessitated consideration that the patient might be at risk for infection with the SARS-CoV-2 virus that causes COVID-19. Institutional protocols and algorithms that pertain to the evaluation of patients at risk for COVID-19 are in a state of rapid change based on information released by regulatory bodies including the CDC and federal and state organizations. These policies and algorithms were followed during the patient's care in the ED.  Final Clinical Impressions(s) / ED Diagnoses   Final diagnoses:  Small bowel obstruction [facility])    ED Discharge Orders    None       Leafy Kindle 09/02/18 [id number]    Noemi Chapel, MD 09/02/18 [phone]

## 2018-09-03 ENCOUNTER — Inpatient Hospital Stay (HOSPITAL_COMMUNITY): Payer: BC Managed Care – PPO

## 2018-09-03 ENCOUNTER — Other Ambulatory Visit: Payer: Self-pay

## 2018-09-03 DIAGNOSIS — K56609 Unspecified intestinal obstruction, unspecified as to partial versus complete obstruction: Secondary | ICD-10-CM

## 2018-09-03 LAB — CBC
HCT: 37.4 % (ref 36.0–46.0)
Hemoglobin: 11.9 g/dL — ABNORMAL LOW (ref 12.0–15.0)
MCH: 30.5 pg (ref 26.0–34.0)
MCHC: 31.8 g/dL (ref 30.0–36.0)
MCV: 95.9 fL (ref 80.0–100.0)
Platelets: 195 10*3/uL (ref 150–400)
RBC: 3.9 MIL/uL (ref 3.87–5.11)
RDW: 14.5 % (ref 11.5–15.5)
WBC: 7.8 10*3/uL (ref 4.0–10.5)
nRBC: 0 % (ref 0.0–0.2)

## 2018-09-03 LAB — COMPREHENSIVE METABOLIC PANEL
ALT: 17 U/L (ref 0–44)
AST: 17 U/L (ref 15–41)
Albumin: 3.4 g/dL — ABNORMAL LOW (ref 3.5–5.0)
Alkaline Phosphatase: 36 U/L — ABNORMAL LOW (ref 38–126)
Anion gap: 6 (ref 5–15)
BUN: 14 mg/dL (ref 6–20)
CO2: 28 mmol/L (ref 22–32)
Calcium: 8.4 mg/dL — ABNORMAL LOW (ref 8.9–10.3)
Chloride: 105 mmol/L (ref 98–111)
Creatinine, Ser: 0.76 mg/dL (ref 0.44–1.00)
GFR calc Af Amer: >60 mL/min (ref >60)
GFR calc non Af Amer: >60 mL/min (ref >60)
Glucose, Bld: 98 mg/dL (ref 70–99)
Potassium: 3.8 mmol/L (ref 3.5–5.1)
Sodium: 139 mmol/L (ref 135–145)
Total Bilirubin: 0.9 mg/dL (ref 0.3–1.2)
Total Protein: 5.4 g/dL — ABNORMAL LOW (ref 6.5–8.1)

## 2018-09-03 LAB — SARS CORONAVIRUS 2 (TAT 6-24 HRS): SARS Coronavirus 2: NEGATIVE

## 2018-09-03 MED ORDER — KCL IN DEXTROSE-NACL 40-5-0.45 MEQ/L-%-% IV SOLN
INTRAVENOUS | Status: DC
  Administered 2018-09-03 – 2018-09-04 (×2): via INTRAVENOUS

## 2018-09-03 MED ORDER — DIPHENHYDRAMINE HCL 25 MG PO CAPS: 25.0000 mg | ORAL_CAPSULE | Freq: Four times a day (QID) | ORAL | Status: DC | PRN

## 2018-09-03 NOTE — Progress Notes (Signed)
Per HPI: Catherine Short  is a 57 y.o. female, with history significant for cecal volvulus and right hemicolectomy presents to the ED today for abdominal pain. Patient reports that her pain woke her out of sleep this morning at 1:00am. It was a 7/10. It is characterized as an intense pressure and ache. It radiates from below her umbilicus into her chest. The pain waxes and wanes. It is constantly present midline below her umbilicus. It is worse with palpation. It has "waves" of worsening pain that are unpredictable. Pain medication (morphine and dilaudid) in the ER made the pain better. At this moment it is a 4/10. Patient reports multiple episodes of small quantity diarrhea. She has not seen any bright red blood. She reports associated nausea without vomiting. She experienced the same set of symptoms 6 days ago, but at that time they spontaneously resolved. Patient reports no fevers at home. Last food items were pretzels 4 hours ago, and toast for breakfast. Last normal meal was dinner last night. Patient has not had any associated gyn symptoms or urinary symptoms.   Patient admitted with what appears to be partial small bowel obstruction with no need for acute surgical intervention at this time.  Rectal contrast CT scan of the abdomen to further assess the colon and anastomosis per general surgery today.  We will continue to follow peripherally as needed.  Please call if any questions.  Transfer to primary general surgery service today.  Total CARE time: 20 minutes.

## 2018-09-03 NOTE — Consult Note (Signed)
Reason for Consult: Bowel obstruction Referring Physician: Dr. Herminio Heads Catherine Short is an 58 y.o. female.  HPI: Patient is a 58 year old white female status post emergent right hemicolectomy in February 2024 a volvulus who presents with 24-hour history of worsening abdominal distention, diarrhea, and nausea.  She states she had a similar episode 1 week ago but it resolved on its own.  A CT scan of the abdomen was performed which revealed dilated bowel consistent with possible partial bowel obstruction versus anastomotic stricture.  Patient states she has been doing well since the earlier surgery.  She has received pain medication and states her pain is 2 out of 10.  An NG tube was attempted but could not be placed.  Patient currently denies any nausea or vomiting.  She has not had a bowel movement since her admission.  Past Medical History:  Diagnosis Date  . Anxiety   . Chronic constipation   . Colitis   . Depression   . Fibroid   . H/O cold sores   . Hormone replacement therapy (HRT) 11/14/2014  . Hot flashes 11/14/2014  . Increased endometrial stripe thickness 05/30/2015   Will get biopsy  . Insomnia   . Liver cyst   . Menopause 11/14/2014  . [place] 05/23/2015  . Nerves 11/14/2014  . Night sweats 11/14/2014  . PMB (postmenopausal bleeding) 05/23/2015  . Prolactinoma ([place])   . Rectocele 05/23/2015    Past Surgical History:  Procedure Laterality Date  . ABDOMINAL SURGERY    . AUGMENTATION MAMMAPLASTY  2000   saline implants  . BREAST ENHANCEMENT SURGERY  1996  . CARPAL TUNNEL RELEASE    . LAPAROSCOPIC RIGHT HEMI COLECTOMY Right 02/27/2018   Procedure: LAPAROSCOPIC RIGHT HEMI COLECTOMY;  Surgeon: [name] Signs, MD;  Location: [facility];  Service: General;  Laterality: Right;  . LAPAROTOMY N/A 02/27/2018   Procedure: EXPLORATORY LAPAROTOMY;  Surgeon: [name] Signs, MD;  Location: [facility];  Service: General;  Laterality: N/A;  . TONSILECTOMY, ADENOIDECTOMY, BILATERAL MYRINGOTOMY AND TUBES       Family History  Problem Relation Age of Onset  . Other Mother        digestive type issues  . Diabetes Mother   . Hypertension Mother   . Heart attack Father   . Heart disease Father   . Other Brother        MVA  . Cancer Maternal Grandmother        uterine  . Emphysema Maternal Grandfather   . Alcohol abuse Brother   . Cancer Paternal Grandfather        colon    Social History:  reports that she has quit smoking. Her smoking use included cigarettes. She quit after 6.00 years of use. She has never used smokeless tobacco. She reports current alcohol use of about 2.0 standard drinks of alcohol per week. She reports that she does not use drugs.  Allergies:  Allergies  Allergen Reactions  . Cyclobenzaprine Hcl     Altered mental status    Medications: I have reviewed the patient's current medications.  Results for orders placed or performed during the hospital encounter of 09/02/18 (from the past 48 hour(s))  Urinalysis, Routine w reflex microscopic     Status: Abnormal   Collection Time: 09/02/18  2:59 PM  Result Value Ref Range   Color, Urine STRAW (A) YELLOW   APPearance CLEAR CLEAR   Specific Gravity, Urine 1.005 1.005 - 1.030   pH 5.0 5.0 - 8.0  Glucose, UA NEGATIVE NEGATIVE mg/dL   Hgb urine dipstick SMALL (A) NEGATIVE   Bilirubin Urine NEGATIVE NEGATIVE   Ketones, ur 5 (A) NEGATIVE mg/dL   Protein, ur NEGATIVE NEGATIVE mg/dL   Nitrite NEGATIVE NEGATIVE   Leukocytes,Ua NEGATIVE NEGATIVE   RBC / HPF 0-5 0 - 5 RBC/hpf   WBC, UA 0-5 0 - 5 WBC/hpf   Bacteria, UA RARE (A) NONE SEEN   Squamous Epithelial / LPF 0-5 0 - 5    Comment: Performed at [facility], [address], [place], [place] [address]  Pregnancy, urine     Status: None   Collection Time: 09/02/18  3:05 PM  Result Value Ref Range   Preg Test, Ur NEGATIVE NEGATIVE    Comment:        THE SENSITIVITY OF THIS METHODOLOGY IS >20 mIU/mL. Performed at [facility], [address], [place], [place]  [address]   CBC with Differential     Status: None   Collection Time: 09/02/18  6:05 PM  Result Value Ref Range   WBC 9.0 4.0 - 10.5 K/uL   RBC 4.50 3.87 - 5.11 MIL/uL   Hemoglobin 14.1 12.0 - 15.0 g/dL   HCT 42.3 36.0 - 46.0 %   MCV 94.0 80.0 - 100.0 fL   MCH 31.3 26.0 - 34.0 pg   MCHC 33.3 30.0 - 36.0 g/dL   RDW 14.6 11.5 - 15.5 %   Platelets 244 150 - 400 K/uL   nRBC 0.0 0.0 - 0.2 %   Neutrophils Relative % 72 %   Neutro Abs 6.3 1.7 - 7.7 K/uL   Lymphocytes Relative 19 %   Lymphs Abs 1.7 0.7 - 4.0 K/uL   Monocytes Relative 8 %   Monocytes Absolute 0.7 0.1 - 1.0 K/uL   Eosinophils Relative 1 %   Eosinophils Absolute 0.1 0.0 - 0.5 K/uL   Basophils Relative 0 %   Basophils Absolute 0.0 0.0 - 0.1 K/uL   Immature Granulocytes 0 %   Abs Immature Granulocytes 0.04 0.00 - 0.07 K/uL    Comment: Performed at [facility], [address] [place], [place] [address]  Comprehensive metabolic panel     Status: Abnormal   Collection Time: 09/02/18  6:05 PM  Result Value Ref Range   Sodium 139 135 - 145 mmol/L   Potassium 3.5 3.5 - 5.1 mmol/L   Chloride 103 98 - 111 mmol/L   CO2 26 22 - 32 mmol/L   Glucose, Bld 114 (H) 70 - 99 mg/dL   BUN 16 6 - 20 mg/dL   Creatinine, Ser 0.74 0.44 - 1.00 mg/dL   Calcium 9.2 8.9 - 10.3 mg/dL   Total Protein 6.8 6.5 - 8.1 g/dL   Albumin 4.2 3.5 - 5.0 g/dL   AST 24 15 - 41 U/L   ALT 22 0 - 44 U/L   Alkaline Phosphatase 42 38 - 126 U/L   Total Bilirubin 0.6 0.3 - 1.2 mg/dL   GFR calc non Af Amer >60 >60 mL/min   GFR calc Af Amer >60 >60 mL/min   Anion gap 10 5 - 15    Comment: Performed at [facility], [address] [place], [place] [address]  Lipase, blood     Status: None   Collection Time: 09/02/18  6:05 PM  Result Value Ref Range   Lipase 20 11 - 51 U/L    Comment: Performed at [facility], [address] [place], [place] [address]  Magnesium  Status: None   Collection Time: 09/02/18  6:05 PM  Result Value Ref Range   Magnesium 2.1 1.7 -  2.4 mg/dL    Comment: Performed at [facility], [address], [place], [place] [address]  Phosphorus     Status: None   Collection Time: 09/02/18  6:05 PM  Result Value Ref Range   Phosphorus 3.8 2.5 - 4.6 mg/dL    Comment: Performed at [facility], [address] [place], [place] [address]  Comprehensive metabolic panel     Status: Abnormal   Collection Time: 09/03/18  6:03 AM  Result Value Ref Range   Sodium 139 135 - 145 mmol/L   Potassium 3.8 3.5 - 5.1 mmol/L   Chloride 105 98 - 111 mmol/L   CO2 28 22 - 32 mmol/L   Glucose, Bld 98 70 - 99 mg/dL   BUN 14 6 - 20 mg/dL   Creatinine, Ser 0.76 0.44 - 1.00 mg/dL   Calcium 8.4 (L) 8.9 - 10.3 mg/dL   Total Protein 5.4 (L) 6.5 - 8.1 g/dL   Albumin 3.4 (L) 3.5 - 5.0 g/dL   AST 17 15 - 41 U/L   ALT 17 0 - 44 U/L   Alkaline Phosphatase 36 (L) 38 - 126 U/L   Total Bilirubin 0.9 0.3 - 1.2 mg/dL   GFR calc non Af Amer >60 >60 mL/min   GFR calc Af Amer >60 >60 mL/min   Anion gap 6 5 - 15    Comment: Performed at [facility], [address] [place], [place] [address]  CBC     Status: Abnormal   Collection Time: 09/03/18  6:03 AM  Result Value Ref Range   WBC 7.8 4.0 - 10.5 K/uL   RBC 3.90 3.87 - 5.11 MIL/uL   Hemoglobin 11.9 (L) 12.0 - 15.0 g/dL   HCT 37.4 36.0 - 46.0 %   MCV 95.9 80.0 - 100.0 fL   MCH 30.5 26.0 - 34.0 pg   MCHC 31.8 30.0 - 36.0 g/dL   RDW 14.5 11.5 - 15.5 %   Platelets 195 150 - 400 K/uL   nRBC 0.0 0.0 - 0.2 %    Comment: Performed at [facility], [address], [place], [place] [address]    Abd 1 View (kub)  Result Date: 09/03/2018 CLINICAL DATA:  Small bowel obstruction. EXAM: ABDOMEN - 1 VIEW COMPARISON:  CT abdomen pelvis from yesterday. FINDINGS: Bowel anastomosis noted in the right upper quadrant. Paucity of small bowel gas. Increased air within the colon and rectum. No bowel dilatation by x-ray. No radiopaque calculi or other significant abnormality. Contrast within the bladder. No acute osseous  abnormality. IMPRESSION: 1. Increasing air within the colon and rectum suggests resolving small-bowel obstruction, however the obstruction is not well evaluated by x-ray due to paucity of small bowel gas, as small bowel loops were predominantly fluid-filled on CT from yesterday. Electronically Signed   By: Titus Dubin M.D.   On: 09/03/2018 08:50   Ct Abdomen Pelvis W Contrast  Result Date: 09/02/2018 CLINICAL DATA:  58 year old female with acute abdominal and pelvic pain, nausea and diarrhea for 1 day. History of RIGHT hemicolectomy. EXAM: CT ABDOMEN AND PELVIS WITH CONTRAST TECHNIQUE: Multidetector CT imaging of the abdomen and pelvis was performed using the standard protocol following bolus administration of intravenous contrast. CONTRAST:  83mL OMNIPAQUE IOHEXOL 300 MG/ML  SOLN COMPARISON:  04/30/2018 FINDINGS: Lower chest: No acute abnormality. Hepatobiliary: The liver is unremarkable except for a RIGHT hepatic cyst  and perfusion abnormality. The gallbladder is unremarkable. No biliary dilatation. Pancreas: Unremarkable Spleen: Unremarkable Adrenals/Urinary Tract: The kidneys, adrenal glands and bladder are unremarkable. Stomach/Bowel: Dilated mid and distal small bowel loops are noted with collapsed mid and distal colon. This is compatible with a high-grade small bowel obstruction with a transition point at or near the colonic anastomotic site. A small amount of interloop fluid is noted within the RIGHT abdomen. There is no evidence of pneumoperitoneum or abscess. Vascular/Lymphatic: No significant vascular findings are present. No enlarged abdominal or pelvic lymph nodes. Reproductive: Uterus and bilateral adnexa are unremarkable. Other: None Musculoskeletal: No acute or suspicious bony abnormalities. Grade 1 spondylolisthesis and moderate degenerative disc disease at L2-3 again noted. IMPRESSION: 1. High-grade small bowel obstruction with transition point at or near the colonic anastomotic site. Small  amount of interloop fluid within the RIGHT abdomen. No evidence of pneumoperitoneum. 2. No other acute abnormality. 3. Grade 1 spondylolisthesis and moderate degenerative disc disease at L2-3. Electronically Signed   By: Margarette Canada M.D.   On: 09/02/2018 19:06    ROS:  Pertinent items are noted in HPI.  Blood pressure 100/68, pulse 70, temperature 99.1 F (37.3 C), temperature source Oral, resp. rate 20, height 5\' 3"  (1.6 m), weight 60.9 kg, last menstrual period 07/05/2016, SpO2 97 %. Physical Exam: Pleasant white female no acute distress Head is normocephalic, atraumatic Lungs clear to auscultation with equal breath sounds bilaterally Heart examination reveals a regular rate and rhythm without S3, S4, murmurs Abdomen is soft with nonspecific discomfort to palpation.  No rigidity is noted.  Bowel sounds are active.  Well-healed midline surgical scars present.  CT scan images personally reviewed  Assessment/Plan: Impression: Partial small bowel obstruction, question adhesive disease versus anastomotic narrowing.  No need for acute surgical invention at this time. Plan: We will transfer to my service.  We will continue bowel rest.  Will order rectal contrast CT scan of the abdomen to further assess the colon and the anastomosis.  Management was discussed with the patient.  [name] Signs 09/03/2018, 9:44 AM

## 2018-09-04 LAB — CBC
HCT: 36.9 % (ref 36.0–46.0)
Hemoglobin: 11.7 g/dL — ABNORMAL LOW (ref 12.0–15.0)
MCH: 30.4 pg (ref 26.0–34.0)
MCHC: 31.7 g/dL (ref 30.0–36.0)
MCV: 95.8 fL (ref 80.0–100.0)
Platelets: 182 10*3/uL (ref 150–400)
RBC: 3.85 MIL/uL — ABNORMAL LOW (ref 3.87–5.11)
RDW: 14.1 % (ref 11.5–15.5)
WBC: 5.5 10*3/uL (ref 4.0–10.5)
nRBC: 0 % (ref 0.0–0.2)

## 2018-09-04 LAB — BASIC METABOLIC PANEL
Anion gap: 8 (ref 5–15)
BUN: 11 mg/dL (ref 6–20)
CO2: 26 mmol/L (ref 22–32)
Calcium: 8.7 mg/dL — ABNORMAL LOW (ref 8.9–10.3)
Chloride: 105 mmol/L (ref 98–111)
Creatinine, Ser: 0.67 mg/dL (ref 0.44–1.00)
GFR calc Af Amer: >60 mL/min (ref >60)
GFR calc non Af Amer: >60 mL/min (ref >60)
Glucose, Bld: 105 mg/dL — ABNORMAL HIGH (ref 70–99)
Potassium: 3.9 mmol/L (ref 3.5–5.1)
Sodium: 139 mmol/L (ref 135–145)

## 2018-09-04 LAB — PHOSPHORUS: Phosphorus: 3.3 mg/dL (ref 2.5–4.6)

## 2018-09-04 LAB — HIV ANTIBODY (ROUTINE TESTING W REFLEX): HIV Screen 4th Generation wRfx: NONREACTIVE

## 2018-09-04 LAB — MAGNESIUM: Magnesium: 1.9 mg/dL (ref 1.7–2.4)

## 2018-09-04 MED ORDER — IOPAMIDOL (ISOVUE-300) INJECTION 61%
450.0000 mL | Freq: Once | INTRAVENOUS | Status: AC | PRN
  Administered 2018-09-04: 450 mL

## 2018-09-04 MED ORDER — MILK AND MOLASSES ENEMA
1.0000 | Freq: Once | RECTAL | Status: AC
  Administered 2018-09-04: 240 mL via RECTAL

## 2018-09-04 NOTE — Progress Notes (Signed)
Subjective: Patient passing some gas.  Having a headache probably due to caffeine withdrawal.  Objective: Vital signs in last 24 hours: Temp:  [98.4 F (36.9 C)-98.9 F (37.2 C)] 98.4 F (36.9 C) (08/30 0513) Pulse Rate:  [69-74] 74 (08/30 0513) Resp:  [18] 18 (08/30 0513) BP: (100-115)/(70-79) 100/77 (08/30 0513) SpO2:  [98 %-100 %] 100 % (08/30 0513) Last BM Date: 09/02/18  Intake/Output from previous day: 08/29 0701 - 08/30 0700 In: 1155.9 [I.V.:1155.9] Out: 3 [Urine:3] Intake/Output this shift: No intake/output data recorded.  General appearance: alert, cooperative and no distress Resp: clear to auscultation bilaterally Cardio: regular rate and rhythm, S1, S2 normal, no murmur, click, rub or gallop GI: Soft, slightly distended.  Bowel sounds present.  Nonspecific discomfort noted to palpation but no rigidity present.  Lab Results:  Recent Labs    09/03/18 0603 09/04/18 0552  WBC 7.8 5.5  HGB 11.9* 11.7*  HCT 37.4 36.9  PLT 195 182   BMET Recent Labs    09/03/18 0603 09/04/18 0552  NA 139 139  K 3.8 3.9  CL 105 105  CO2 28 26  GLUCOSE 98 105*  BUN 14 11  CREATININE 0.76 0.67  CALCIUM 8.4* 8.7*   PT/INR No results for input(s): LABPROT, INR in the last 72 hours.  Studies/Results: Abd 1 View (kub)  Result Date: 09/03/2018 CLINICAL DATA:  Small bowel obstruction. EXAM: ABDOMEN - 1 VIEW COMPARISON:  CT abdomen pelvis from yesterday. FINDINGS: Bowel anastomosis noted in the right upper quadrant. Paucity of small bowel gas. Increased air within the colon and rectum. No bowel dilatation by x-ray. No radiopaque calculi or other significant abnormality. Contrast within the bladder. No acute osseous abnormality. IMPRESSION: 1. Increasing air within the colon and rectum suggests resolving small-bowel obstruction, however the obstruction is not well evaluated by x-ray due to paucity of small bowel gas, as small bowel loops were predominantly fluid-filled on CT from  yesterday. Electronically Signed   By: Titus Dubin M.D.   On: 09/03/2018 08:50   Ct Abdomen Pelvis W Contrast  Result Date: 09/02/2018 CLINICAL DATA:  58 year old female with acute abdominal and pelvic pain, nausea and diarrhea for 1 day. History of RIGHT hemicolectomy. EXAM: CT ABDOMEN AND PELVIS WITH CONTRAST TECHNIQUE: Multidetector CT imaging of the abdomen and pelvis was performed using the standard protocol following bolus administration of intravenous contrast. CONTRAST:  89mL OMNIPAQUE IOHEXOL 300 MG/ML  SOLN COMPARISON:  04/30/2018 FINDINGS: Lower chest: No acute abnormality. Hepatobiliary: The liver is unremarkable except for a RIGHT hepatic cyst and perfusion abnormality. The gallbladder is unremarkable. No biliary dilatation. Pancreas: Unremarkable Spleen: Unremarkable Adrenals/Urinary Tract: The kidneys, adrenal glands and bladder are unremarkable. Stomach/Bowel: Dilated mid and distal small bowel loops are noted with collapsed mid and distal colon. This is compatible with a high-grade small bowel obstruction with a transition point at or near the colonic anastomotic site. A small amount of interloop fluid is noted within the RIGHT abdomen. There is no evidence of pneumoperitoneum or abscess. Vascular/Lymphatic: No significant vascular findings are present. No enlarged abdominal or pelvic lymph nodes. Reproductive: Uterus and bilateral adnexa are unremarkable. Other: None Musculoskeletal: No acute or suspicious bony abnormalities. Grade 1 spondylolisthesis and moderate degenerative disc disease at L2-3 again noted. IMPRESSION: 1. High-grade small bowel obstruction with transition point at or near the colonic anastomotic site. Small amount of interloop fluid within the RIGHT abdomen. No evidence of pneumoperitoneum. 2. No other acute abnormality. 3. Grade 1 spondylolisthesis and moderate degenerative disc  disease at L2-3. Electronically Signed   By: Margarette Canada M.D.   On: 09/02/2018 19:06     Anti-infectives: Anti-infectives (From admission, onward)   None      Assessment/Plan: s/p  Impression: Partial bowel obstruction.  Will get CT scan of abdomen and pelvis with rectal contrast.  Will write for milk of molasses enema.  Hopefully patient will open up.  No need for acute surgical intervention at this time.  LOS: 2 days    [name] Signs 09/04/2018

## 2018-09-05 NOTE — Discharge Summary (Signed)
Physician Discharge Summary  Patient ID: Catherine Short MRN: [id number] DOB/AGE: 56-Jan-1962 56 y.o.  Admit date: 09/02/2018 Discharge date: 09/05/2018  Admission Diagnoses: Partial bowel obstruction  Discharge Diagnoses: Same Active Problems:   Small bowel obstruction [facility])   Discharged Condition: good  Hospital Course: Patient is a 58 year old white female who presented to the hospital with worsening abdominal distention and nausea.  CT scan of the abdomen was suggestive of a bowel obstruction either secondary to anastomotic narrowing or adhesive disease.  She was admitted to the hospital for IV hydration and bowel rest..  Subsequent KUB showed resolving bowel obstruction.  CT scan of the abdomen with rectal contrast revealed free flow of contrast through the ileal transverse colon anastomosis and into the small intestine.  Small intestine distally was mildly dilated.  The etiology of this was unknown.  The patient had multiple bowel movements over the past 24 hours.  Her diet was advanced to a regular diet without difficulty.  She is being discharged home on 09/05/2018 in good and improving condition.  The morning however you  Discharge Exam: Blood pressure 102/70, pulse (!) 55, temperature 98.3 F (36.8 C), temperature source Oral, resp. rate 18, height 5\' 3"  (1.6 m), weight 60.9 kg, last menstrual period 07/05/2016, SpO2 99 %. General appearance: alert, cooperative and no distress Resp: clear to auscultation bilaterally Cardio: regular rate and rhythm, S1, S2 normal, no murmur, click, rub or gallop GI: soft, non-tender; bowel sounds normal; no masses,  no organomegaly  Disposition: Discharge disposition: 01-Home or Self Care       Discharge Instructions    Diet - low sodium heart healthy   Complete by: As directed    Increase activity slowly   Complete by: As directed      Allergies as of 09/05/2018      Reactions   Cyclobenzaprine Hcl    Altered mental status       Medication List    STOP taking these medications   HYDROcodone-acetaminophen 5-325 MG tablet Commonly known as: NORCO/VICODIN     TAKE these medications   Aleve 220 MG Caps Generic drug: Naproxen Sodium Take 1 capsule by mouth every morning.   ASHWAGANDHA PO Take 1 tablet by mouth daily.   COLLAGEN PO Take 1 tablet by mouth daily.   dicyclomine 20 MG tablet Commonly known as: Bentyl Take 1 tablet (20 mg total) by mouth 3 (three) times daily before meals.   FISH OIL PO Take 1 capsule by mouth every evening. DHA and EFA-daily   GLUTATHIONE PO Take 500 mg by mouth daily.   levocetirizine 5 MG tablet Commonly known as: XYZAL Take 5 mg by mouth every evening.   meloxicam 15 MG tablet Commonly known as: MOBIC Take 15 mg by mouth daily.   VITAMIN D-3 PO Take 400 Units by mouth daily.   vitamin E 400 UNIT capsule Take 400 Units by mouth daily.      Follow-up Information    [name] Signs, MD Follow up.   Specialty: General Surgery Why: as needed Contact information: [id number] [place] [id number] [phone]           Signed: [name] Signs 09/05/2018, 8:54 AM

## 2018-09-05 NOTE — Discharge Instructions (Signed)

## 2018-09-29 ENCOUNTER — Inpatient Hospital Stay: Payer: BLUE CROSS/BLUE SHIELD | Admitting: Family Medicine

## 2018-10-06 DIAGNOSIS — Z9889 Other specified postprocedural states: Secondary | ICD-10-CM

## 2018-10-06 HISTORY — DX: Other specified postprocedural states: Z98.890

## 2018-10-06 NOTE — Progress Notes (Signed)
New Patient Office Visit  Assessment & Plan:  1. Anxiety - Previously failed therapy with Prozac, Zoloft, and Lexapro. Wellbutrin and Buspirone were not terrible at treating the anxiety but were years ago. Rx'ing Effexor today to also help with vasomotor symptoms. Education provided on GAD.  - venlafaxine XR (EFFEXOR XR) 37.5 MG 24 hr capsule; Take 1 capsule (37.5 mg total) by mouth daily with breakfast.  Dispense: 30 capsule; Refill: 2  2. Vasomotor symptoms due to menopause - Effexor to help with vasomotor symptoms.  - venlafaxine XR (EFFEXOR XR) 37.5 MG 24 hr capsule; Take 1 capsule (37.5 mg total) by mouth daily with breakfast.  Dispense: 30 capsule; Refill: 2  3. Dyspareunia in female - Patient has appointment to discuss with OBGYN in the next few weeks.   4. Mass of right forearm - Patient is scheduled for surgery next week.   5-6. History of small bowel obstruction/History of right hemicolectomy - Ambulatory referral to Gastroenterology   Follow-up: Return in about 6 weeks (around 11/18/2018) for anxiety.   Catherine Limes, MSN, APRN, FNP-C [facility]  Subjective:  Patient ID: Catherine Short, female    DOB: October 13, 1960  Age: 58 y.o. MRN: [id number]  Patient Care Team: Loman Brooklyn, FNP as PCP - General (Family Medicine)  CC:  Chief Complaint  Patient presents with   Establish Care   Hospitalization Follow-up    [facility] - small bowel obstruction - 09/02/18 to 09/05/18.    HPI Catherine Short presents as a hospital follow-up. She is a new patient today but only because she hasn't had a visit since 2015; she has not been going elsewhere.   Patient was hospitalized 09/02/2018-09/05/2018 due to a partial bowel obstruction. At time of discharge she was tolerating a regular diet and having regular bowel movements. Patient also had an episode of abdominal pain, nausea, and diarrhea that led her to the ER on 04/30/2018. The nausea and abdominal pain were  treated; she was discharged on a bland diet. 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   At this time patient is tolerating a regular diet and having regular bowel movements. No abdominal pain since hospitalization. She would like a referral to GI. She will be due for colonoscopy 10/19/2018.   Patient also has concerns about anxiety which is making it difficult for her to sleep. She reports she used to take Ambien. Patient reports she both has trouble falling asleep and staying asleep. It takes her at least an hour to go to sleep at night once she gets in the bed. She is very "hyper" and her "anxiety is through the roof". She has tried anxiety medication in the past including Wellbutrin, Prozac, Zoloft, Lexapro, and Buspirone.    Review of Systems  Constitutional: Negative for chills, fever, malaise/fatigue and weight loss.  HENT: Negative for congestion, ear discharge, ear pain, nosebleeds, sinus pain, sore throat and tinnitus.   Eyes: Negative for blurred vision, double vision, pain, discharge and redness.  Respiratory: Negative for cough, shortness of breath and wheezing.   Cardiovascular: Negative for chest pain, palpitations and leg swelling.  Gastrointestinal: Negative for abdominal pain, constipation, diarrhea, heartburn, nausea and vomiting.  Genitourinary: Negative for dysuria, frequency and urgency.  Musculoskeletal: Positive for neck pain. Negative for myalgias.  Skin: Negative for rash.  Neurological: Negative for dizziness, seizures, weakness and headaches.  Psychiatric/Behavioral: Negative for depression, substance abuse and suicidal ideas. The patient is nervous/anxious.  Current Outpatient Medications:    ASHWAGANDHA PO, Take 1 tablet by mouth daily., Disp: , Rfl:    Cholecalciferol (VITAMIN D-3 PO), Take 400 Units by mouth daily. , Disp: , Rfl:    COLLAGEN PO, Take 1 tablet by mouth daily. , Disp: , Rfl:    dicyclomine (BENTYL) 20 MG  tablet, Take 1 tablet (20 mg total) by mouth 3 (three) times daily before meals., Disp: 90 tablet, Rfl: 1   GLUTATHIONE PO, Take 500 mg by mouth daily., Disp: , Rfl:    levocetirizine (XYZAL) 5 MG tablet, Take 5 mg by mouth every evening., Disp: , Rfl:    meloxicam (MOBIC) 15 MG tablet, Take 15 mg by mouth daily., Disp: , Rfl:    Naproxen Sodium (ALEVE) 220 MG CAPS, Take 1 capsule by mouth every morning. , Disp: , Rfl:    Omega-3 Fatty Acids (FISH OIL PO), Take 1 capsule by mouth every evening. DHA and EFA-daily , Disp: , Rfl:    vitamin E 400 UNIT capsule, Take 400 Units by mouth daily. , Disp: , Rfl:    venlafaxine XR (EFFEXOR XR) 37.5 MG 24 hr capsule, Take 1 capsule (37.5 mg total) by mouth daily with breakfast., Disp: 30 capsule, Rfl: 2  No Known Allergies  Past Medical History:  Diagnosis Date   Allergy    Anxiety    Chronic constipation    Colitis    Depression    Fibroid    H/O cold sores    Hormone replacement therapy (HRT) 11/14/2014   Hot flashes 11/14/2014   Increased endometrial stripe thickness 05/30/2015   Will get biopsy   Insomnia    Liver cyst    Menopause 11/14/2014   Moody 05/23/2015   Nerves 11/14/2014   Night sweats 11/14/2014   PMB (postmenopausal bleeding) 05/23/2015   Prolactinoma ([place])    Rectocele 05/23/2015   Rotator cuff syndrome of left shoulder 10/30/2010    Past Surgical History:  Procedure Laterality Date   ABDOMINAL SURGERY     AUGMENTATION MAMMAPLASTY  2000   saline implants   BREAST ENHANCEMENT SURGERY  1996   CARPAL TUNNEL RELEASE     LAPAROSCOPIC RIGHT HEMI COLECTOMY Right 02/27/2018   Procedure: LAPAROSCOPIC RIGHT HEMI COLECTOMY;  Surgeon: [name] Signs, MD;  Location: [facility];  Service: General;  Laterality: Right;   LAPAROTOMY N/A 02/27/2018   Procedure: EXPLORATORY LAPAROTOMY;  Surgeon: [name] Signs, MD;  Location: [facility];  Service: General;  Laterality: N/A;   TONSILECTOMY, ADENOIDECTOMY, BILATERAL  MYRINGOTOMY AND TUBES      Family History  Problem Relation Age of Onset   Other Mother        digestive type issues   Diabetes Mother    Hypertension Mother    Heart attack Father    Heart disease Father    Early death Brother        MVA   Uterine cancer Maternal Grandmother    Emphysema Maternal Grandfather    Alcohol abuse Brother    Colon cancer Paternal Grandfather     Social History   Socioeconomic History   Marital status: Married    Spouse name: Not on file   Number of children: 1   Years of education: Not on file   Highest education level: Not on file  Occupational History   Occupation: Best boy: [employer]    Comment: Social research officer, government strain: Not on file   Food insecurity  Worry: Not on file    Inability: Not on file   Transportation needs    Medical: Not on file    Non-medical: Not on file  Tobacco Use   Smoking status: Former Smoker    Years: 6.00    Types: Cigarettes   Smokeless tobacco: Never Used  Substance and Sexual Activity   Alcohol use: Yes    Alcohol/week: 2.0 standard drinks    Types: 2 Glasses of wine per week    Comment: twice a week   Drug use: No   Sexual activity: Yes    Partners: Male    Birth control/protection: None, Other-see comments    Comment: husband with vasectomy  Lifestyle   Physical activity    Days per week: Not on file    Minutes per session: Not on file   Stress: Not on file  Relationships   Social connections    Talks on phone: Not on file    Gets together: Not on file    Attends religious service: Not on file    Active member of club or organization: Not on file    Attends meetings of clubs or organizations: Not on file    Relationship status: Not on file   Intimate partner violence    Fear of current or ex partner: Not on file    Emotionally abused: Not on file    Physically abused: Not on file    Forced sexual activity: Not on file    Other Topics Concern   Not on file  Social History Narrative   Not on file    Objective:   Today's Vitals: BP 103/71    Pulse (!) 53    Temp 98.9 F (37.2 C)    Resp 20    Ht 5\' 3"  (1.6 m)    Wt 134 lb (60.8 kg)    LMP 07/05/2016 (Approximate) Comment: neg upt   SpO2 100%    BMI 23.74 kg/m   Physical Exam Vitals signs reviewed.  Constitutional:      General: She is not in acute distress.    Appearance: Normal appearance. She is normal weight. She is not ill-appearing, toxic-appearing or diaphoretic.  HENT:     Head: Normocephalic and atraumatic.  Eyes:     General: No scleral icterus.       Right eye: No discharge.        Left eye: No discharge.     Conjunctiva/sclera: Conjunctivae normal.  Neck:     Musculoskeletal: Normal range of motion.  Cardiovascular:     Rate and Rhythm: Normal rate and regular rhythm.     Heart sounds: Normal heart sounds. No murmur. No friction rub. No gallop.   Pulmonary:     Effort: Pulmonary effort is normal. No respiratory distress.     Breath sounds: Normal breath sounds. No stridor. No wheezing, rhonchi or rales.  Musculoskeletal: Normal range of motion.  Skin:    General: Skin is warm and dry.     Capillary Refill: Capillary refill takes less than 2 seconds.  Neurological:     General: No focal deficit present.     Mental Status: She is alert and oriented to person, place, and time. Mental status is at baseline.  Psychiatric:        Mood and Affect: Mood normal.        Behavior: Behavior normal.        Thought Content: Thought content normal.  Judgment: Judgment normal.

## 2018-10-07 ENCOUNTER — Ambulatory Visit (INDEPENDENT_AMBULATORY_CARE_PROVIDER_SITE_OTHER): Payer: BC Managed Care – PPO | Admitting: Family Medicine

## 2018-10-07 ENCOUNTER — Other Ambulatory Visit: Payer: Self-pay

## 2018-10-07 ENCOUNTER — Encounter: Payer: Self-pay | Admitting: Family Medicine

## 2018-10-07 VITALS — BP 103/71 | HR 53 | Temp 98.9°F | Resp 20 | Ht 63.0 in | Wt 134.0 lb

## 2018-10-07 DIAGNOSIS — R2231 Localized swelling, mass and lump, right upper limb: Secondary | ICD-10-CM

## 2018-10-07 DIAGNOSIS — N941 Unspecified dyspareunia: Secondary | ICD-10-CM | POA: Diagnosis not present

## 2018-10-07 DIAGNOSIS — F419 Anxiety disorder, unspecified: Secondary | ICD-10-CM

## 2018-10-07 DIAGNOSIS — N951 Menopausal and female climacteric states: Secondary | ICD-10-CM | POA: Diagnosis not present

## 2018-10-07 DIAGNOSIS — Z8719 Personal history of other diseases of the digestive system: Secondary | ICD-10-CM

## 2018-10-07 DIAGNOSIS — Z9049 Acquired absence of other specified parts of digestive tract: Secondary | ICD-10-CM

## 2018-10-07 MED ORDER — VENLAFAXINE HCL ER 37.5 MG PO CP24: 37.5000 mg | ORAL_CAPSULE | Freq: Every day | ORAL | 2 refills | Status: DC

## 2018-10-07 NOTE — Patient Instructions (Signed)

## 2018-10-10 ENCOUNTER — Other Ambulatory Visit: Payer: Self-pay

## 2018-10-10 DIAGNOSIS — R2231 Localized swelling, mass and lump, right upper limb: Secondary | ICD-10-CM | POA: Diagnosis not present

## 2018-10-10 DIAGNOSIS — M180 Bilateral primary osteoarthritis of first carpometacarpal joints: Secondary | ICD-10-CM | POA: Diagnosis not present

## 2018-10-10 DIAGNOSIS — D2111 Benign neoplasm of connective and other soft tissue of right upper limb, including shoulder: Secondary | ICD-10-CM | POA: Diagnosis not present

## 2018-10-11 ENCOUNTER — Telehealth: Payer: Self-pay | Admitting: Family Medicine

## 2018-10-11 NOTE — Telephone Encounter (Signed)
lmtcb

## 2018-10-11 NOTE — Telephone Encounter (Signed)
I am still working on this. I have placed a couple of calls and am waiting for someone to return my call.

## 2018-10-11 NOTE — Telephone Encounter (Signed)
Patient aware.

## 2018-10-21 ENCOUNTER — Encounter: Payer: Self-pay | Admitting: Family Medicine

## 2018-10-24 ENCOUNTER — Ambulatory Visit (INDEPENDENT_AMBULATORY_CARE_PROVIDER_SITE_OTHER): Payer: BC Managed Care – PPO | Admitting: Physician Assistant

## 2018-10-24 ENCOUNTER — Encounter: Payer: Self-pay | Admitting: Physician Assistant

## 2018-10-24 ENCOUNTER — Other Ambulatory Visit: Payer: Self-pay

## 2018-10-24 VITALS — BP 102/62 | HR 77 | Temp 97.8°F | Ht 63.0 in | Wt 134.0 lb

## 2018-10-24 DIAGNOSIS — Z1159 Encounter for screening for other viral diseases: Secondary | ICD-10-CM | POA: Diagnosis not present

## 2018-10-24 DIAGNOSIS — Z8719 Personal history of other diseases of the digestive system: Secondary | ICD-10-CM | POA: Diagnosis not present

## 2018-10-24 DIAGNOSIS — Z6823 Body mass index (BMI) 23.0-23.9, adult: Secondary | ICD-10-CM | POA: Diagnosis not present

## 2018-10-24 DIAGNOSIS — K5900 Constipation, unspecified: Secondary | ICD-10-CM

## 2018-10-24 DIAGNOSIS — Z1211 Encounter for screening for malignant neoplasm of colon: Secondary | ICD-10-CM

## 2018-10-24 DIAGNOSIS — K5909 Other constipation: Secondary | ICD-10-CM | POA: Diagnosis not present

## 2018-10-24 DIAGNOSIS — Z01419 Encounter for gynecological examination (general) (routine) without abnormal findings: Secondary | ICD-10-CM | POA: Diagnosis not present

## 2018-10-24 MED ORDER — LINACLOTIDE 145 MCG PO CAPS: 145.0000 ug | ORAL_CAPSULE | Freq: Every day | ORAL | 11 refills | Status: DC

## 2018-10-24 MED ORDER — NA SULFATE-K SULFATE-MG SULF 17.5-3.13-1.6 GM/177ML PO SOLN: ORAL | 0 refills | Status: DC

## 2018-10-24 NOTE — Progress Notes (Signed)
Subjective:    Patient ID: Catherine Short, female    DOB: 08-Apr-1960, 58 y.o.   MRN: [id number]  HPI Catherine Short is a pleasant 58 year old white female, new to GI today, previously known to Dr. Deatra Ina and last seen in 2015.  She comes in today for follow-up after recent hospitalization at [facility] with a partial small bowel obstruction, with complaints of ongoing issues with constipation. Patient was diagnosed with a cecal volvulus in February 2020 and underwent a right hemicolectomy per Dr. Arnoldo Morale at [facility].  Also carries a diagnosis of microscopic colitis diagnosed in 2011 but had not been on any treatment for that.  Her issues over many years have been constipation.  When she was seen in 2015 she had been on a trial of Linzess which she says worked well for quite a while and then seemed to stop working.  She was given Amitiza 24 twice daily cannot remember now whether or not that helped or not. Her last colonoscopy was in 2010, negative other than internal hemorrhoids. She was hospitalized in August 2020 with the partial small bowel obstruction.  This was managed conservatively with bowel rest and enemas and resolved. She says she always eats healthy, lots of fruits and vegetables and drinks a lot of water.  Most of the time she is constipated but she will have episodes of loose stools may last for a few days and then she goes back to being constipated.  She may go for 3 to 4 days without a bowel movement and has gone as long as 2 weeks without a bowel movement in the past.  No recent melena or hematochezia.  She is not currently on any laxatives but has been drinking MiraLAX for the past couple of days without any benefit. She is concerned about trying to avoid constipation as Dr. Elpidio Anis had told her this may contribute to partial obstructions.  Review of Systems Pertinent positive and negative review of systems were noted in the above HPI section.  All other review of systems was  otherwise negative.  Outpatient Encounter Medications as of 10/24/2018  Medication Sig  . ASHWAGANDHA PO Take 1 tablet by mouth daily.  . Cholecalciferol (VITAMIN D-3 PO) Take 400 Units by mouth daily.   . COLLAGEN PO Take 1 tablet by mouth daily.   [name] Kitchen dicyclomine (BENTYL) 20 MG tablet Take 1 tablet (20 mg total) by mouth 3 (three) times daily before meals.  [name] Kitchen GLUTATHIONE PO Take 500 mg by mouth daily.  [name] Kitchen levocetirizine (XYZAL) 5 MG tablet Take 5 mg by mouth every evening.  . meloxicam (MOBIC) 15 MG tablet Take 15 mg by mouth daily.  . Naproxen Sodium (ALEVE) 220 MG CAPS Take 1 capsule by mouth every morning.   . Omega-3 Fatty Acids (FISH OIL PO) Take 1 capsule by mouth every evening. DHA and EFA-daily   . venlafaxine XR (EFFEXOR XR) 37.5 MG 24 hr capsule Take 1 capsule (37.5 mg total) by mouth daily with breakfast.  . vitamin E 400 UNIT capsule Take 400 Units by mouth daily.   [name] Kitchen linaclotide (LINZESS) 145 MCG CAPS capsule Take 1 capsule (145 mcg total) by mouth daily before breakfast.  . Na Sulfate-K Sulfate-Mg Sulf 17.5-3.13-1.6 GM/177ML SOLN Suprep-Use as directed   No facility-administered encounter medications on file as of 10/24/2018.    No Known Allergies Patient Active Problem List   Diagnosis Date Noted  . Small bowel obstruction ([place]) 09/02/2018  . Primary osteoarthritis of both first carpometacarpal  joints 08/18/2018  . Cecal volvulus ([place])   . Increased endometrial stripe thickness 05/30/2015  . Rectocele 05/23/2015  . Menopause 11/14/2014  . Vasomotor symptoms due to menopause 11/14/2014  . Muscle spasms of neck 10/30/2010  . Microscopic colitis 11/11/2009   Social History   Socioeconomic History  . Marital status: Married    Spouse name: Not on file  . Number of children: 1  . Years of education: Not on file  . Highest education level: Not on file  Occupational History  . Occupation: Best boy: [employer]  Social Needs  .  Financial resource strain: Not on file  . Food insecurity    Worry: Not on file    Inability: Not on file  . Transportation needs    Medical: Not on file    Non-medical: Not on file  Tobacco Use  . Smoking status: Former Smoker    Years: 6.00    Types: Cigarettes  . Smokeless tobacco: Never Used  Substance and Sexual Activity  . Alcohol use: Yes    Alcohol/week: 2.0 standard drinks    Types: 2 Glasses of wine per week    Comment: twice a week  . Drug use: No  . Sexual activity: Yes    Partners: Male    Birth control/protection: None, Other-see comments    Comment: husband with vasectomy  Lifestyle  . Physical activity    Days per week: Not on file    Minutes per session: Not on file  . Stress: Not on file  Relationships  . Social Herbalist on phone: Not on file    Gets together: Not on file    Attends religious service: Not on file    Active member of club or organization: Not on file    Attends meetings of clubs or organizations: Not on file    Relationship status: Not on file  . Intimate partner violence    Fear of current or ex partner: Not on file    Emotionally abused: Not on file    Physically abused: Not on file    Forced sexual activity: Not on file  Other Topics Concern  . Not on file  Social History Narrative  . Not on file    Ms. Cheese's family history includes Alcohol abuse in her brother; Colon cancer in her paternal grandfather; Diabetes in her mother; Early death in her brother; Emphysema in her maternal grandfather; Heart attack in her father; Heart disease in her father; Hypertension in her mother; Other in her mother; Uterine cancer in her maternal grandmother.      Objective:    Vitals:   10/24/18 1047  BP: 102/62  Pulse: 77  Temp: 97.8 F (36.6 C)  SpO2: 98%    Physical Exam Well-developed well-nourished older white female in no acute distress.  Height, Weight 134, BMI 23.7  HEENT; nontraumatic normocephalic, EOMI, PE RR  LA, sclera anicteric. Oropharynx; not examined/mask/Covid Neck; supple, no JVD Cardiovascular; regular rate and rhythm with S1-S2, no murmur rub or gallop Pulmonary; Clear bilaterally Abdomen; soft, intimately tender right lower quadrant, nondistended, no palpable mass or hepatosplenomegaly, bowel sounds are active, low midline incisional scar Rectal; not done Skin; benign exam, no jaundice rash or appreciable lesions Extremities; no clubbing cyanosis or edema skin warm and dry Neuro/Psych; alert and oriented x4, grossly nonfocal mood and affect appropriate       Assessment & Plan:   #  32 58 year old white female with history of chronic constipation for many years.  #2 colon cancer screening-last colonoscopy 2010 - due for follow-up #3 history of cecal volvulus status post right hemicolectomy February 2020. #4 history of partial small bowel obstruction August 2020-hospitalized at [facility], obstruction felt secondary to adhesions and managed conservatively #5 remote diagnosis of microscopic colitis 2011  Plan; Continue high-fiber diet and advised to drink at least 60 ounces of water per day Start trial of Linzess 145 mcg daily.  Patient was told she could try this initially daily and if too much back off to half dose daily or every other day.  She is asked to call back in a couple of weeks with an update, if not effective can try another drug.  Patient will be scheduled for colonoscopy with Dr. Tarri Glenn.  Procedure was discussed in detail with the patient including indications risks and benefits and she is agreeable to proceed.   Jermario Kalmar Genia Harold PA-C 10/24/2018   Cc: Loman Brooklyn, FNP

## 2018-10-24 NOTE — Patient Instructions (Addendum)
If you are age 56 or older, your body mass index should be between 23-30. Your Body mass index is 23.74 kg/m. If this is out of the aforementioned range listed, please consider follow up with your Primary Care Provider.  If you are age 12 or younger, your body mass index should be between 19-25. Your Body mass index is 23.74 kg/m. If this is out of the aformentioned range listed, please consider follow up with your Primary Care Provider.   You have been scheduled for a colonoscopy. Please follow written instructions given to you at your visit today.  Please pick up your prep supplies at the pharmacy within the next 1-3 days. If you use inhalers (even only as needed), please bring them with you on the day of your procedure. Your physician has requested that you go to www.startemmi.com and enter the access code given to you at your visit today. This web site gives a general overview about your procedure. However, you should still follow specific instructions given to you by our office regarding your preparation for the procedure.  We have sent the following medications to your pharmacy for you to pick up at your convenience: Suprep  Linzess 145 mcg every morning.  You have been given a High Fiber Diet.  Drink at least 60 ounces of water daily.  Thank you for choosing me and [facility].  Due to recent COVID-19 restrictions implemented by [employer] and state authorities and in an effort to keep both patients and staff as safe as possible, [facility] requires COVID-19 testing prior to any scheduled endoscopic procedure. The testing center is located at [address] [place], [place] [address] in the [facility] [address] [employer]  suite.  Your appointment has been scheduled for 11/18/18 AT 8 AM.   Please bring your insurance cards to this appointment. You will require your COVID screen 2 business days prior to your endoscopic  procedure.  You are not required to quarantine after your screening.  You will only receive a phone call with the results if it is POSITIVE.  If you do not receive a call the day before your procedure you should begin your prep, if ordered, and you should report to the endo center for your procedure at your designated appointment arrival time ( one hour prior to the procedure time). There is no cost to you for the screening on the day of the swab.  [facility] will file with your insurance company for the testing.    You may receive an automated phone call prior to your procedure or have a message in your MyChart that you have an appointment for a BP/15 at the [facility], please disregard this message.  Your testing will be at the [address] [place] location.   If you are leaving [facility] travel [place] on [address], turn left onto [address], turn night onto [address] at the 1st stop light turn right, pass the [employer] on your right and proceed to [address] (white building).   [name] [name], PA-C

## 2018-10-25 NOTE — Progress Notes (Signed)
Reviewed. I agree with documentation including the assessment and plan.  Misao Fackrell L. Althea Backs, MD, MPH 

## 2018-10-31 ENCOUNTER — Emergency Department (HOSPITAL_COMMUNITY): Payer: BC Managed Care – PPO

## 2018-10-31 ENCOUNTER — Encounter (HOSPITAL_COMMUNITY): Payer: Self-pay

## 2018-10-31 ENCOUNTER — Observation Stay (HOSPITAL_COMMUNITY)
Admission: EM | Admit: 2018-10-31 | Discharge: 2018-11-01 | Disposition: A | Discharge status: Home / Self Care | Payer: BC Managed Care – PPO | Attending: Gastroenterology | Admitting: Gastroenterology

## 2018-10-31 ENCOUNTER — Other Ambulatory Visit: Payer: Self-pay

## 2018-10-31 DIAGNOSIS — Z79899 Other long term (current) drug therapy: Secondary | ICD-10-CM | POA: Insufficient documentation

## 2018-10-31 DIAGNOSIS — Z811 Family history of alcohol abuse and dependence: Secondary | ICD-10-CM | POA: Diagnosis not present

## 2018-10-31 DIAGNOSIS — G4700 Insomnia, unspecified: Secondary | ICD-10-CM | POA: Insufficient documentation

## 2018-10-31 DIAGNOSIS — Z9049 Acquired absence of other specified parts of digestive tract: Secondary | ICD-10-CM | POA: Diagnosis not present

## 2018-10-31 DIAGNOSIS — Z8249 Family history of ischemic heart disease and other diseases of the circulatory system: Secondary | ICD-10-CM | POA: Insufficient documentation

## 2018-10-31 DIAGNOSIS — Z20828 Contact with and (suspected) exposure to other viral communicable diseases: Secondary | ICD-10-CM | POA: Diagnosis not present

## 2018-10-31 DIAGNOSIS — Z8049 Family history of malignant neoplasm of other genital organs: Secondary | ICD-10-CM | POA: Insufficient documentation

## 2018-10-31 DIAGNOSIS — Z800 Family history of malignant neoplasm of digestive organs: Secondary | ICD-10-CM | POA: Insufficient documentation

## 2018-10-31 DIAGNOSIS — D649 Anemia, unspecified: Secondary | ICD-10-CM

## 2018-10-31 DIAGNOSIS — K2970 Gastritis, unspecified, without bleeding: Secondary | ICD-10-CM | POA: Insufficient documentation

## 2018-10-31 DIAGNOSIS — R109 Unspecified abdominal pain: Secondary | ICD-10-CM | POA: Diagnosis not present

## 2018-10-31 DIAGNOSIS — T39395A Adverse effect of other nonsteroidal anti-inflammatory drugs [NSAID], initial encounter: Secondary | ICD-10-CM | POA: Diagnosis not present

## 2018-10-31 DIAGNOSIS — Z825 Family history of asthma and other chronic lower respiratory diseases: Secondary | ICD-10-CM | POA: Diagnosis not present

## 2018-10-31 DIAGNOSIS — Z87891 Personal history of nicotine dependence: Secondary | ICD-10-CM | POA: Diagnosis not present

## 2018-10-31 DIAGNOSIS — F329 Major depressive disorder, single episode, unspecified: Secondary | ICD-10-CM | POA: Diagnosis not present

## 2018-10-31 DIAGNOSIS — F419 Anxiety disorder, unspecified: Secondary | ICD-10-CM | POA: Diagnosis present

## 2018-10-31 DIAGNOSIS — M19041 Primary osteoarthritis, right hand: Secondary | ICD-10-CM | POA: Diagnosis not present

## 2018-10-31 DIAGNOSIS — K922 Gastrointestinal hemorrhage, unspecified: Secondary | ICD-10-CM | POA: Diagnosis present

## 2018-10-31 DIAGNOSIS — M5136 Other intervertebral disc degeneration, lumbar region: Secondary | ICD-10-CM | POA: Diagnosis not present

## 2018-10-31 DIAGNOSIS — M19042 Primary osteoarthritis, left hand: Secondary | ICD-10-CM | POA: Diagnosis not present

## 2018-10-31 DIAGNOSIS — I10 Essential (primary) hypertension: Secondary | ICD-10-CM | POA: Insufficient documentation

## 2018-10-31 DIAGNOSIS — G709 Myoneural disorder, unspecified: Secondary | ICD-10-CM | POA: Insufficient documentation

## 2018-10-31 DIAGNOSIS — R42 Dizziness and giddiness: Secondary | ICD-10-CM | POA: Diagnosis not present

## 2018-10-31 DIAGNOSIS — K254 Chronic or unspecified gastric ulcer with hemorrhage: Secondary | ICD-10-CM | POA: Diagnosis not present

## 2018-10-31 DIAGNOSIS — R519 Headache, unspecified: Secondary | ICD-10-CM | POA: Insufficient documentation

## 2018-10-31 HISTORY — DX: Headache, unspecified: R51.9

## 2018-10-31 LAB — CBC
HCT: 34.3 % — ABNORMAL LOW (ref 36.0–46.0)
Hemoglobin: 10.9 g/dL — ABNORMAL LOW (ref 12.0–15.0)
MCH: 31 pg (ref 26.0–34.0)
MCHC: 31.8 g/dL (ref 30.0–36.0)
MCV: 97.4 fL (ref 80.0–100.0)
Platelets: 205 10*3/uL (ref 150–400)
RBC: 3.52 MIL/uL — ABNORMAL LOW (ref 3.87–5.11)
RDW: 14.1 % (ref 11.5–15.5)
WBC: 10.3 10*3/uL (ref 4.0–10.5)
nRBC: 0 % (ref 0.0–0.2)

## 2018-10-31 LAB — URINALYSIS, ROUTINE W REFLEX MICROSCOPIC
Bilirubin Urine: NEGATIVE
Glucose, UA: NEGATIVE mg/dL
Hgb urine dipstick: NEGATIVE
Ketones, ur: 5 mg/dL — AB
Leukocytes,Ua: NEGATIVE
Nitrite: NEGATIVE
Protein, ur: NEGATIVE mg/dL
Specific Gravity, Urine: 1.023 (ref 1.005–1.030)
pH: 5 (ref 5.0–8.0)

## 2018-10-31 LAB — TYPE AND SCREEN
ABO/RH(D): O POS
Antibody Screen: NEGATIVE

## 2018-10-31 LAB — COMPREHENSIVE METABOLIC PANEL
ALT: 24 U/L (ref 0–44)
AST: 23 U/L (ref 15–41)
Albumin: 3.5 g/dL (ref 3.5–5.0)
Alkaline Phosphatase: 35 U/L — ABNORMAL LOW (ref 38–126)
Anion gap: 8 (ref 5–15)
BUN: 45 mg/dL — ABNORMAL HIGH (ref 6–20)
CO2: 24 mmol/L (ref 22–32)
Calcium: 8.6 mg/dL — ABNORMAL LOW (ref 8.9–10.3)
Chloride: 104 mmol/L (ref 98–111)
Creatinine, Ser: 0.88 mg/dL (ref 0.44–1.00)
GFR calc Af Amer: >60 mL/min (ref >60)
GFR calc non Af Amer: >60 mL/min (ref >60)
Glucose, Bld: 158 mg/dL — ABNORMAL HIGH (ref 70–99)
Potassium: 4.4 mmol/L (ref 3.5–5.1)
Sodium: 136 mmol/L (ref 135–145)
Total Bilirubin: 0.8 mg/dL (ref 0.3–1.2)
Total Protein: 5.8 g/dL — ABNORMAL LOW (ref 6.5–8.1)

## 2018-10-31 LAB — ABO/RH: ABO/RH(D): O POS

## 2018-10-31 LAB — SARS CORONAVIRUS 2 BY RT PCR (HOSPITAL ORDER, PERFORMED IN ~~LOC~~ HOSPITAL LAB): SARS Coronavirus 2: NEGATIVE

## 2018-10-31 LAB — POC OCCULT BLOOD, ED: Fecal Occult Bld: POSITIVE — AB

## 2018-10-31 MED ORDER — VENLAFAXINE HCL ER 37.5 MG PO CP24
37.5000 mg | ORAL_CAPSULE | Freq: Every day | ORAL | Status: DC
  Filled 2018-10-31: qty 1

## 2018-10-31 MED ORDER — ONDANSETRON HCL 4 MG/2ML IJ SOLN: 4.0000 mg | Freq: Four times a day (QID) | INTRAMUSCULAR | Status: DC | PRN

## 2018-10-31 MED ORDER — ACETAMINOPHEN 650 MG RE SUPP: 650.0000 mg | Freq: Four times a day (QID) | RECTAL | Status: DC | PRN

## 2018-10-31 MED ORDER — ACETAMINOPHEN 325 MG PO TABS: 650.0000 mg | ORAL_TABLET | Freq: Four times a day (QID) | ORAL | Status: DC | PRN

## 2018-10-31 MED ORDER — SODIUM CHLORIDE 0.9 % IV BOLUS
1000.0000 mL | Freq: Once | INTRAVENOUS | Status: AC
  Administered 2018-10-31: 1000 mL via INTRAVENOUS

## 2018-10-31 MED ORDER — IOHEXOL 300 MG/ML  SOLN
100.0000 mL | Freq: Once | INTRAMUSCULAR | Status: AC | PRN
  Administered 2018-10-31: 100 mL via INTRAVENOUS

## 2018-10-31 MED ORDER — ONDANSETRON HCL 4 MG/2ML IJ SOLN
4.0000 mg | Freq: Once | INTRAMUSCULAR | Status: AC
  Administered 2018-10-31: 4 mg via INTRAVENOUS
  Filled 2018-10-31: qty 2

## 2018-10-31 MED ORDER — NITROFURANTOIN MONOHYD MACRO 100 MG PO CAPS: 100.0000 mg | ORAL_CAPSULE | Freq: Two times a day (BID) | ORAL | Status: DC

## 2018-10-31 MED ORDER — PANTOPRAZOLE SODIUM 40 MG IV SOLR
40.0000 mg | Freq: Once | INTRAVENOUS | Status: AC
  Administered 2018-10-31: 18:00:00 40 mg via INTRAVENOUS
  Filled 2018-10-31: qty 40

## 2018-10-31 MED ORDER — PANTOPRAZOLE SODIUM 40 MG IV SOLR
40.0000 mg | Freq: Two times a day (BID) | INTRAVENOUS | Status: DC
  Administered 2018-11-01: 40 mg via INTRAVENOUS
  Filled 2018-10-31: qty 40

## 2018-10-31 MED ORDER — SODIUM CHLORIDE 0.9 % IV SOLN
INTRAVENOUS | Status: AC
  Administered 2018-11-01: 01:00:00 via INTRAVENOUS

## 2018-10-31 MED ORDER — ONDANSETRON HCL 4 MG PO TABS: 4.0000 mg | ORAL_TABLET | Freq: Four times a day (QID) | ORAL | Status: DC | PRN

## 2018-10-31 NOTE — ED Notes (Signed)
Dr. Laural Golden paged to [phone]

## 2018-10-31 NOTE — ED Triage Notes (Addendum)
Pt reports that her stomach has been upset all weekend. She has felt dizzy. Pt went to BR at work and stoll was red mixed with coffee ground and then had another episode in ED BR. Pt reports she is dizzy and nauseated

## 2018-10-31 NOTE — ED Notes (Signed)
Dr. Fuller Plan paged to [phone]

## 2018-10-31 NOTE — H&P (Signed)
History and Physical    Catherine Short:[id number] DOB: 1960-12-15 DOA: 10/31/2018  PCP: Loman Brooklyn, FNP   Patient coming from: Home   Chief Complaint: Abdominal discomfort, nausea, bloody stool   HPI: Catherine Short is a 58 y.o. female with medical history significant for anxiety, collagenous colitis, cecal volvulus status post right hemicolectomy, now presenting to the emergency department for evaluation of abdominal pain, nausea, vomiting, and bloody stools.  Patient reports roughly 3 days of abdominal discomfort, described as cramping, as well as indigestion and nausea.  She has felt as though she wanted to vomit but has not.  She went on to develop loose stools, reports having a very dark stool this afternoon, and then later what appeared to be red blood.  She has developed lightheadedness today.  She denies any fevers, chest pain, or headaches.  She reports having another grossly bloody bowel movement in the emergency department.  She reports daily naproxen use.  ED Course: Upon arrival to the ED, patient is found to be afebrile, saturating well on room air, initial blood pressure 88/55, and normal heart rate.  Chemistry panel is notable for BUN of 45 with creatinine 0.88.  CBC features a hemoglobin of 10.9, down from 11.7 in August.  Fecal occult blood testing is positive.  CT of the abdomen and pelvis demonstrates gastric wall thickening without discrete ulcer cavity or evidence for perforation.  Type and screen was performed and the patient was treated with a liter of normal saline and IV Protonix.  Gastroenterology was consulted by the ED physician.  COVID-19 testing is in process.  Review of Systems:  All other systems reviewed and apart from HPI, are negative.  Past Medical History:  Diagnosis Date  . Allergy   . Anxiety   . Chronic constipation   . Colitis   . Depression   . Fibroid   . H/O cold sores   . H/O removal of cyst 10/2018   right arm   . Hormone  replacement therapy (HRT) 11/14/2014  . Hot flashes 11/14/2014  . Increased endometrial stripe thickness 05/30/2015   Will get biopsy  . Insomnia   . Liver cyst   . Menopause 11/14/2014  . Moody 05/23/2015  . Nerves 11/14/2014  . Night sweats 11/14/2014  . PMB (postmenopausal bleeding) 05/23/2015  . Prolactinoma ([place])   . Rectocele 05/23/2015  . Rotator cuff syndrome of left shoulder 10/30/2010    Past Surgical History:  Procedure Laterality Date  . ABDOMINAL SURGERY    . AUGMENTATION MAMMAPLASTY  2000   saline implants  . BREAST ENHANCEMENT SURGERY  1996  . CARPAL TUNNEL RELEASE    . LAPAROSCOPIC RIGHT HEMI COLECTOMY Right 02/27/2018   Procedure: LAPAROSCOPIC RIGHT HEMI COLECTOMY;  Surgeon: [name] Signs, MD;  Location: [facility];  Service: General;  Laterality: Right;  . LAPAROTOMY N/A 02/27/2018   Procedure: EXPLORATORY LAPAROTOMY;  Surgeon: [name] Signs, MD;  Location: [facility];  Service: General;  Laterality: N/A;  . TONSILECTOMY, ADENOIDECTOMY, BILATERAL MYRINGOTOMY AND TUBES       reports that she has quit smoking. Her smoking use included cigarettes. She quit after 6.00 years of use. She has never used smokeless tobacco. She reports current alcohol use of about 2.0 standard drinks of alcohol per week. She reports that she does not use drugs.  No Known Allergies  Family History  Problem Relation Age of Onset  . Other Mother        digestive type issues  .  Diabetes Mother   . Hypertension Mother   . Heart attack Father   . Heart disease Father   . Early death Brother        MVA  . Uterine cancer Maternal Grandmother   . Emphysema Maternal Grandfather   . Alcohol abuse Brother   . Colon cancer Paternal Grandfather      Prior to Admission medications   Medication Sig Start Date End Date Taking? Authorizing Provider  Amino Acids (AMINO ACID PO) Take 1 tablet by mouth every morning.   Yes [provider]  ASHWAGANDHA PO Take 1 tablet by mouth every morning.    Yes  [provider]  Cholecalciferol (VITAMIN D-3) 25 MCG (1000 UT) CAPS Take 1,000 Units by mouth every morning.    Yes [provider]  COLLAGEN PO Take 1 tablet by mouth every morning.    Yes [provider]  GLUTATHIONE PO Take 500 mg by mouth daily.   Yes [provider]  levocetirizine (XYZAL) 5 MG tablet Take 5 mg by mouth 4 (four) times a week.    Yes [provider]  Naproxen Sodium (ALEVE) 220 MG CAPS Take 1 capsule by mouth every morning.    Yes [provider]  nitrofurantoin, macrocrystal-monohydrate, (MACROBID) 100 MG capsule Take 100 mg by mouth every 12 (twelve) hours. 7 day course starting on 10/28/2018 10/28/18  Yes [provider]  Omega-3 Fatty Acids (FISH OIL PO) Take 1 capsule by mouth every evening. DHA and EFA-daily    Yes [provider]  PREMARIN vaginal cream Place 1 Applicatorful vaginally at bedtime.  10/24/18  Yes [provider]  tretinoin (RETIN-A) 0.1 % cream Apply 1 application topically at bedtime. 10/28/18  Yes [provider]  venlafaxine XR (EFFEXOR XR) 37.5 MG 24 hr capsule Take 1 capsule (37.5 mg total) by mouth daily with breakfast. 10/07/18  Yes Hendricks Limes F, FNP  vitamin E 400 UNIT capsule Take 400 Units by mouth daily.    Yes [provider]  linaclotide Rolan Lipa) 145 MCG CAPS capsule Take 1 capsule (145 mcg total) by mouth daily before breakfast. Patient not taking: Reported on 10/31/2018 10/24/18   [name], [name] S, PA-C  Na Sulfate-K Sulfate-Mg Sulf 17.5-3.13-1.6 GM/177ML SOLN Suprep-Use as directed Patient taking differently: Take 1 kit by mouth as directed. Suprep-Use as directed 10/24/18   Alfredia Ferguson, PA-C    Physical Exam: Vitals:   10/31/18 1629 10/31/18 2100  BP: (!) 88/55 104/67  Pulse: 63 72  Resp: 20 18  Temp: 98 F (36.7 C)   TempSrc: Oral   SpO2: 100% 97%  Weight: 60.8 kg   Height: '5\' 3"'  (1.6 m)     Constitutional: NAD, calm   Eyes: PERTLA, lids and conjunctivae normal ENMT: Mucous membranes are moist. Posterior pharynx clear of any exudate or lesions.   Neck: normal, supple, no masses, no thyromegaly Respiratory: no wheezing, no crackles. Normal respiratory effort. No accessory muscle use.  Cardiovascular: S1 & S2 heard, regular rate and rhythm. No extremity edema.   Abdomen: No distension, no tenderness, soft. Bowel sounds active.  Musculoskeletal: no clubbing / cyanosis. No joint deformity upper and lower extremities.    Skin: no significant rashes, lesions, ulcers. Warm, dry, well-perfused. Neurologic: No facial asymmetry. Sensation intact. Moving all extremities.  Psychiatric: Alert and oriented to person, place, and situation. Pleasant, cooperative.    Labs on Admission: I have personally reviewed following labs and imaging studies  CBC: Recent Labs  Lab 10/31/18 1647  WBC 10.3  HGB 10.9*  HCT 34.3*  MCV 97.4  PLT 275   Basic Metabolic Panel: Recent Labs  Lab 10/31/18 1647  NA 136  K 4.4  CL 104  CO2 24  GLUCOSE 158*  BUN 45*  CREATININE 0.88  CALCIUM 8.6*   GFR: Estimated Creatinine Clearance: 59 mL/min (by C-G formula based on SCr of 0.88 mg/dL). Liver Function Tests: Recent Labs  Lab 10/31/18 1647  AST 23  ALT 24  ALKPHOS 35*  BILITOT 0.8  PROT 5.8*  ALBUMIN 3.5   No results for input(s): LIPASE, AMYLASE in the last 168 hours. No results for input(s): AMMONIA in the last 168 hours. Coagulation Profile: No results for input(s): INR, PROTIME in the last 168 hours. Cardiac Enzymes: No results for input(s): CKTOTAL, CKMB, CKMBINDEX, TROPONINI in the last 168 hours. BNP (last 3 results) No results for input(s): PROBNP in the last 8760 hours. HbA1C: No results for input(s): HGBA1C in the last 72 hours. CBG: No results for input(s): GLUCAP in the last 168 hours. Lipid Profile: No results for input(s): CHOL, HDL, LDLCALC, TRIG, CHOLHDL, LDLDIRECT in the last 72 hours.  Thyroid Function Tests: No results for input(s): TSH, T4TOTAL, FREET4, T3FREE, THYROIDAB in the last 72 hours. Anemia Panel: No results for input(s): VITAMINB12, FOLATE, FERRITIN, TIBC, IRON, RETICCTPCT in the last 72 hours. Urine analysis:    Component Value Date/Time   COLORURINE YELLOW 10/31/2018 1804   APPEARANCEUR HAZY (A) 10/31/2018 1804   LABSPEC 1.023 10/31/2018 1804   PHURINE 5.0 10/31/2018 1804   GLUCOSEU NEGATIVE 10/31/2018 1804   HGBUR NEGATIVE 10/31/2018 1804   BILIRUBINUR NEGATIVE 10/31/2018 1804   KETONESUR 5 (A) 10/31/2018 1804   PROTEINUR NEGATIVE 10/31/2018 1804   NITRITE NEGATIVE 10/31/2018 1804   LEUKOCYTESUR NEGATIVE 10/31/2018 1804   Sepsis Labs: '@LABRCNTIP' (procalcitonin:4,lacticidven:4) )No results found for this or any previous visit (from the past 240 hour(s)).   Radiological Exams on Admission: Ct Abdomen Pelvis W Contrast  Result Date: 10/31/2018 CLINICAL DATA:  Upset stomach all week and, generalized acute abdominal pain, stool was red with coffee-ground material, dizziness, nausea EXAM: CT ABDOMEN AND PELVIS WITH CONTRAST TECHNIQUE: Multidetector CT imaging of the abdomen and pelvis was performed using the standard protocol following bolus administration of intravenous contrast. Sagittal and coronal MPR images reconstructed from axial data set. CONTRAST:  164m OMNIPAQUE IOHEXOL 300 MG/ML SOLN IV. No oral contrast. COMPARISON:  09/04/2018 FINDINGS: Lower chest: Minimal dependent atelectasis in the lower lobes Hepatobiliary: Small posterior RIGHT lobe liver cyst 1.5 x 1.3 cm image 25. Tiny nonspecific low-attenuation focus superiorly in RIGHT lobe liver too small to characterize. No additional hepatic mass lesion. Gallbladder contracted. Pancreas: Unremarkable Spleen: Normal appearance Adrenals/Urinary Tract: Adrenal glands normal appearance. Small extrarenal pelves bilaterally. Kidneys, ureters, and bladder otherwise normal appearance. Stomach/Bowel: Diffuse  wall thickening of the gastric antrum question gastritis. No discrete ulcer cavity or perigastric inflammatory/infiltrative changes seen. No extraluminal gas. Appendix surgically absent by history. Bowel anastomotic staple line in the RIGHT mid abdomen consistent with prior RIGHT hemicolectomy. Remaining bowel loops normal appearance Vascular/Lymphatic: Aorta normal caliber. Retroaortic LEFT renal vein. Vascular structures patent. No adenopathy. Scattered pelvic phleboliths. Reproductive: Unremarkable uterus and adnexa Other: Tiny umbilical hernia containing fat. No free air or free fluid. Musculoskeletal: Degenerative disc disease changes L2-L3 with retrolisthesis. No acute osseous findings. IMPRESSION: Prior RIGHT hemicolectomy. Gastric antral wall thickening question gastritis. No discrete ulcer cavity or evidence of perigastric infiltration/extraluminal gas to suggest perforated ulcer,  recommend correlation with endoscopy. Remainder of exam unremarkable. Electronically Signed   By: Lavonia Dana M.D.   On: 10/31/2018 18:57    EKG: Not performed.   Assessment/Plan   1. Acute GI bleeding  - Presents with abdominal discomfort, indigestion, bloody BM, and lightheadedness, is found to have SBP in 80's initially with Hgb 10.9 (down from 11.7 in August)  - With ingidgestion, elevated BUN, and regular Alleve use, there is concern for upper source; additionally, there is gastric wall thickening on CT without ulcer cavity or evidence for perforation  - She was given a liter of NS in ED, IV Protonix, and type & screen was performed  - Gastroenterology is consulting and much appreciated  - Continue bowel-rest, IV Protonix, IVF hydration, trend H&H, transfuse as needed    2. Anxiety  - Continue Effexor    3. UTI  - Patient reports recent UTI diagnosis, culture not available  - There is no fever, leukocytosis, or flank pain  - Continue Macrobid one more day to complete 5 days of treatment    PPE: mask,  face shield  DVT prophylaxis: SCD's  Code Status: Full  Family Communication: Discussed with patient  Consults called: GI consulted by ED physician  Admission status: Observation     Vianne Bulls, MD [facility] Pager [phone]  If 7PM-7AM, please contact night-coverage [email or web address] Password [email or web address]  10/31/2018, 10:18 PM

## 2018-10-31 NOTE — ED Provider Notes (Signed)
[facility] EMERGENCY DEPARTMENT Provider Note   CSN: [id number] Arrival date & time: 10/31/18  1550     History   Chief Complaint Chief Complaint  Patient presents with  . Blood In Stools    HPI Catherine Short is a 58 y.o. 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  Felt not much better yesterday, then today while at work had several episodes of diarrhea, the first episode contained very dark and coffee ground like substance, the second episode positive for copious bright red blood, again the same upon arrival here.  She reports lightheadedness, became diaphoretic when the diarrhea first started today. She denies abdominal distention, has mild persistent epigastric pain, but currently her abdomen is pain free. Denies rectal pain and no h/o hemorrhoids. Also no h/o GERD or PUD.  She has had no tx prior to arrival today.  Denies history of nsaid use.       The history is provided by the patient.    Past Medical History:  Diagnosis Date  . Allergy   . Anxiety   . Chronic constipation   . Colitis   . Depression   . Fibroid   . H/O cold sores   . H/O removal of cyst 10/2018   right arm   . Hormone replacement therapy (HRT) 11/14/2014  . Hot flashes 11/14/2014  . Increased endometrial stripe thickness 05/30/2015   Will get biopsy  . Insomnia   . Liver cyst   . Menopause 11/14/2014  . Moody 05/23/2015  . Nerves 11/14/2014  . Night sweats 11/14/2014  . PMB (postmenopausal bleeding) 05/23/2015  . Prolactinoma ([place])   . Rectocele 05/23/2015  . Rotator cuff syndrome of left shoulder 10/30/2010    Patient Active Problem List   Diagnosis Date Noted  . Small bowel obstruction ([place]) 09/02/2018   . Primary osteoarthritis of both first carpometacarpal joints 08/18/2018  . Cecal volvulus ([place])   . Increased endometrial stripe thickness 05/30/2015  . Rectocele 05/23/2015  . Menopause 11/14/2014  . Vasomotor symptoms due to menopause 11/14/2014  . Muscle spasms of neck 10/30/2010  . Microscopic colitis 11/11/2009    Past Surgical History:  Procedure Laterality Date  . ABDOMINAL SURGERY    . AUGMENTATION MAMMAPLASTY  2000   saline implants  . BREAST ENHANCEMENT SURGERY  1996  . CARPAL TUNNEL RELEASE    . LAPAROSCOPIC RIGHT HEMI COLECTOMY Right 02/27/2018   Procedure: LAPAROSCOPIC RIGHT HEMI COLECTOMY;  Surgeon: [name] Signs, MD;  Location: [facility];  Service: General;  Laterality: Right;  . LAPAROTOMY N/A 02/27/2018   Procedure: EXPLORATORY LAPAROTOMY;  Surgeon: [name] Signs, MD;  Location: [facility];  Service: General;  Laterality: N/A;  . TONSILECTOMY, ADENOIDECTOMY, BILATERAL MYRINGOTOMY AND TUBES       OB History    Gravida  2   Para  1   Term      Preterm      AB  1   Living  1     SAB  1   TAB      Ectopic      Multiple      Live Births               Home Medications  Prior to Admission medications   Medication Sig Start Date End Date Taking? Authorizing Provider  Amino Acids (AMINO ACID PO) Take 1 tablet by mouth every morning.   Yes [provider]  ASHWAGANDHA PO Take 1 tablet by mouth every morning.    Yes [provider]  Cholecalciferol (VITAMIN D-3) 25 MCG (1000 UT) CAPS Take 1,000 Units by mouth every morning.    Yes [provider]  COLLAGEN PO Take 1 tablet by mouth every morning.    Yes [provider]  GLUTATHIONE PO Take 500 mg by mouth daily.   Yes [provider]  levocetirizine (XYZAL) 5 MG tablet Take 5 mg by mouth 4 (four) times a week.    Yes [provider]  nitrofurantoin, macrocrystal-monohydrate, (MACROBID) 100 MG capsule Take 100 mg by mouth every 12 (twelve) hours. 7  day course starting on 10/28/2018 10/28/18  Yes [provider]  Omega-3 Fatty Acids (FISH OIL PO) Take 1 capsule by mouth every evening. DHA and EFA-daily    Yes [provider]  PREMARIN vaginal cream Place 1 Applicatorful vaginally at bedtime.  10/24/18  Yes [provider]  tretinoin (RETIN-A) 0.1 % cream Apply 1 application topically at bedtime. 10/28/18  Yes [provider]  venlafaxine XR (EFFEXOR XR) 37.5 MG 24 hr capsule Take 1 capsule (37.5 mg total) by mouth daily with breakfast. 10/07/18  Yes Hendricks Limes F, FNP  vitamin E 400 UNIT capsule Take 400 Units by mouth daily.    Yes [provider]  linaclotide Rolan Lipa) 145 MCG CAPS capsule Take 1 capsule (145 mcg total) by mouth daily before breakfast. Patient not taking: Reported on 10/31/2018 10/24/18   [name], [name] S, PA-C  Na Sulfate-K Sulfate-Mg Sulf 17.5-3.13-1.6 GM/177ML SOLN Suprep-Use as directed Patient taking differently: Take 1 kit by mouth as directed. Suprep-Use as directed 10/24/18   [name], [name] S, PA-C    Family History Family History  Problem Relation Age of Onset  . Other Mother        digestive type issues  . Diabetes Mother   . Hypertension Mother   . Heart attack Father   . Heart disease Father   . Early death Brother        MVA  . Uterine cancer Maternal Grandmother   . Emphysema Maternal Grandfather   . Alcohol abuse Brother   . Colon cancer Paternal Grandfather     Social History Social History   Tobacco Use  . Smoking status: Former Smoker    Years: 6.00    Types: Cigarettes  . Smokeless tobacco: Never Used  Substance Use Topics  . Alcohol use: Yes    Alcohol/week: 2.0 standard drinks    Types: 2 Glasses of wine per week    Comment: twice a week  . Drug use: No     Allergies   Patient has no known allergies.   Review of Systems Review of Systems  Constitutional: Positive for diaphoresis. Negative for fever.  HENT: Negative for  congestion and sore throat.   Eyes: Negative.   Respiratory: Negative for chest tightness and shortness of breath.   Cardiovascular: Negative for chest pain.  Gastrointestinal: Positive for abdominal pain and blood in stool. Negative for nausea and vomiting.  Genitourinary: Positive for dysuria.  Musculoskeletal: Negative for arthralgias, joint swelling and neck pain.  Skin: Negative.  Negative for color change, rash and wound.  Neurological: Positive for light-headedness. Negative for dizziness, weakness, numbness and headaches.  Psychiatric/Behavioral: Negative.  Physical Exam Updated Vital Signs BP 104/67   Pulse 72   Temp 98 F (36.7 C) (Oral)   Resp 18   Ht '5\' 3"'  (1.6 m)   Wt 60.8 kg   LMP 07/05/2016 (Approximate) Comment: neg upt  SpO2 97%   BMI 23.74 kg/m   Physical Exam Vitals signs and nursing note reviewed. Exam conducted with a chaperone present.  Constitutional:      Appearance: She is well-developed.  HENT:     Head: Normocephalic and atraumatic.  Eyes:     Conjunctiva/sclera: Conjunctivae normal.  Neck:     Musculoskeletal: Normal range of motion.  Cardiovascular:     Rate and Rhythm: Normal rate and regular rhythm.     Heart sounds: Normal heart sounds.  Pulmonary:     Effort: Pulmonary effort is normal.     Breath sounds: Normal breath sounds. No wheezing.  Abdominal:     General: Abdomen is flat. Bowel sounds are normal. There is no distension.     Palpations: Abdomen is soft.     Tenderness: There is abdominal tenderness in the epigastric area. There is no guarding or rebound. Negative signs include Murphy's sign.  Genitourinary:    Rectum: Normal. No tenderness, anal fissure or external hemorrhoid.  Musculoskeletal: Normal range of motion.  Skin:    General: Skin is warm and dry.  Neurological:     Mental Status: She is alert.      ED Treatments / Results  Labs (all labs ordered are listed, but only abnormal results are displayed)  Labs Reviewed  COMPREHENSIVE METABOLIC PANEL - Abnormal; Notable for the following components:      Result Value   Glucose, Bld 158 (*)    BUN 45 (*)    Calcium 8.6 (*)    Total Protein 5.8 (*)    Alkaline Phosphatase 35 (*)    All other components within normal limits  CBC - Abnormal; Notable for the following components:   RBC 3.52 (*)    Hemoglobin 10.9 (*)    HCT 34.3 (*)    All other components within normal limits  URINALYSIS, ROUTINE W REFLEX MICROSCOPIC - Abnormal; Notable for the following components:   APPearance HAZY (*)    Ketones, ur 5 (*)    All other components within normal limits  POC OCCULT BLOOD, ED - Abnormal; Notable for the following components:   Fecal Occult Bld POSITIVE (*)    All other components within normal limits  C DIFFICILE QUICK SCREEN W PCR REFLEX  SARS CORONAVIRUS 2 BY RT PCR (HOSPITAL ORDER, [facility])  TYPE AND SCREEN  ABO/RH   Hemoccult strongly positive.   EKG None  Radiology Ct Abdomen Pelvis W Contrast  Result Date: 10/31/2018 CLINICAL DATA:  Upset stomach all week and, generalized acute abdominal pain, stool was red with coffee-ground material, dizziness, nausea EXAM: CT ABDOMEN AND PELVIS WITH CONTRAST TECHNIQUE: Multidetector CT imaging of the abdomen and pelvis was performed using the standard protocol following bolus administration of intravenous contrast. Sagittal and coronal MPR images reconstructed from axial data set. CONTRAST:  171m OMNIPAQUE IOHEXOL 300 MG/ML SOLN IV. No oral contrast. COMPARISON:  09/04/2018 FINDINGS: Lower chest: Minimal dependent atelectasis in the lower lobes Hepatobiliary: Small posterior RIGHT lobe liver cyst 1.5 x 1.3 cm image 25. Tiny nonspecific low-attenuation focus superiorly in RIGHT lobe liver too small to characterize. No additional hepatic mass lesion. Gallbladder contracted. Pancreas: Unremarkable Spleen: Normal appearance Adrenals/Urinary Tract: Adrenal glands  normal appearance. Small extrarenal pelves bilaterally. Kidneys, ureters, and bladder otherwise normal appearance. Stomach/Bowel: Diffuse wall thickening of the gastric antrum question gastritis. No discrete ulcer cavity or perigastric inflammatory/infiltrative changes seen. No extraluminal gas. Appendix surgically absent by history. Bowel anastomotic staple line in the RIGHT mid abdomen consistent with prior RIGHT hemicolectomy. Remaining bowel loops normal appearance Vascular/Lymphatic: Aorta normal caliber. Retroaortic LEFT renal vein. Vascular structures patent. No adenopathy. Scattered pelvic phleboliths. Reproductive: Unremarkable uterus and adnexa Other: Tiny umbilical hernia containing fat. No free air or free fluid. Musculoskeletal: Degenerative disc disease changes L2-L3 with retrolisthesis. No acute osseous findings. IMPRESSION: Prior RIGHT hemicolectomy. Gastric antral wall thickening question gastritis. No discrete ulcer cavity or evidence of perigastric infiltration/extraluminal gas to suggest perforated ulcer, recommend correlation with endoscopy. Remainder of exam unremarkable. Electronically Signed   By: Lavonia Dana M.D.   On: 10/31/2018 18:57    Procedures Procedures (including critical care time)  Medications Ordered in ED Medications  sodium chloride 0.9 % bolus 1,000 mL (1,000 mLs Intravenous New Bag/Given 10/31/18 1751)  pantoprazole (PROTONIX) injection 40 mg (40 mg Intravenous Given 10/31/18 1750)  ondansetron (ZOFRAN) injection 4 mg (4 mg Intravenous Given 10/31/18 1812)  iohexol (OMNIPAQUE) 300 MG/ML solution 100 mL (100 mLs Intravenous Contrast Given 10/31/18 1832)     Initial Impression / Assessment and Plan / ED Course  I have reviewed the triage vital signs and the nursing notes.  Pertinent labs & imaging results that were available during my care of the patient were reviewed by me and considered in my medical decision making (see chart for details).        Pt  without any further diarrhea or rectal bleeding after arrival here.  Comfortable with minimal abdominal discomfort.  Of note,  Pt does report takes one otc Aleve every morning with breakfast for tx of chronic wrist pain.   Discussed case with Dr. Fuller Plan of [facility] as pt is scheduled for screening colonoscopy with their group, has seen them in the past, defers to local GI.  Discussed with Dr. Laural Golden who will consult patient and plan for endoscopy tomorrow. Advised to add C dif testing. Hospitalist admission planned.  Discussed with Dr.Opyd who will see and admit pt.   Final Clinical Impressions(s) / ED Diagnoses   Final diagnoses:  Gastrointestinal hemorrhage, unspecified gastrointestinal hemorrhage type    ED Discharge Orders    None       Landis Martins 10/31/18 2210    Margette Fast, MD 11/01/18 1053

## 2018-10-31 NOTE — ED Notes (Signed)
Dr. Laural Golden paged again to [phone]

## 2018-11-01 ENCOUNTER — Observation Stay (HOSPITAL_COMMUNITY): Payer: BC Managed Care – PPO | Admitting: Anesthesiology

## 2018-11-01 ENCOUNTER — Encounter (HOSPITAL_COMMUNITY): Payer: Self-pay | Admitting: *Deleted

## 2018-11-01 ENCOUNTER — Other Ambulatory Visit: Payer: Self-pay

## 2018-11-01 ENCOUNTER — Encounter: Payer: Self-pay | Admitting: Family Medicine

## 2018-11-01 ENCOUNTER — Encounter (HOSPITAL_COMMUNITY)
Admission: EM | Disposition: A | Discharge status: Home / Self Care | Payer: Self-pay | Source: Home / Self Care | Attending: Emergency Medicine

## 2018-11-01 DIAGNOSIS — D649 Anemia, unspecified: Secondary | ICD-10-CM | POA: Diagnosis not present

## 2018-11-01 DIAGNOSIS — K2210 Ulcer of esophagus without bleeding: Secondary | ICD-10-CM

## 2018-11-01 DIAGNOSIS — K259 Gastric ulcer, unspecified as acute or chronic, without hemorrhage or perforation: Secondary | ICD-10-CM | POA: Diagnosis not present

## 2018-11-01 DIAGNOSIS — K2970 Gastritis, unspecified, without bleeding: Secondary | ICD-10-CM | POA: Diagnosis not present

## 2018-11-01 DIAGNOSIS — K922 Gastrointestinal hemorrhage, unspecified: Secondary | ICD-10-CM

## 2018-11-01 DIAGNOSIS — K2950 Unspecified chronic gastritis without bleeding: Secondary | ICD-10-CM | POA: Diagnosis not present

## 2018-11-01 HISTORY — PX: ESOPHAGOGASTRODUODENOSCOPY (EGD) WITH PROPOFOL: SHX5813

## 2018-11-01 HISTORY — DX: Gastrointestinal hemorrhage, unspecified: K92.2

## 2018-11-01 HISTORY — PX: BIOPSY: SHX5522

## 2018-11-01 LAB — BASIC METABOLIC PANEL
Anion gap: 5 (ref 5–15)
BUN: 33 mg/dL — ABNORMAL HIGH (ref 6–20)
CO2: 25 mmol/L (ref 22–32)
Calcium: 8 mg/dL — ABNORMAL LOW (ref 8.9–10.3)
Chloride: 108 mmol/L (ref 98–111)
Creatinine, Ser: 0.7 mg/dL (ref 0.44–1.00)
GFR calc Af Amer: >60 mL/min (ref >60)
GFR calc non Af Amer: >60 mL/min (ref >60)
Glucose, Bld: 111 mg/dL — ABNORMAL HIGH (ref 70–99)
Potassium: 4.1 mmol/L (ref 3.5–5.1)
Sodium: 138 mmol/L (ref 135–145)

## 2018-11-01 LAB — HEMOGLOBIN AND HEMATOCRIT, BLOOD
HCT: 30.1 % — ABNORMAL LOW (ref 36.0–46.0)
Hemoglobin: 9.8 g/dL — ABNORMAL LOW (ref 12.0–15.0)

## 2018-11-01 SURGERY — ESOPHAGOGASTRODUODENOSCOPY (EGD) WITH PROPOFOL
Anesthesia: General

## 2018-11-01 MED ORDER — ACETAMINOPHEN 10 MG/ML IV SOLN
1000.0000 mg | Freq: Once | INTRAVENOUS | Status: AC
  Administered 2018-11-01: 1000 mg via INTRAVENOUS
  Filled 2018-11-01: qty 100

## 2018-11-01 MED ORDER — SODIUM CHLORIDE 0.9 % IV SOLN: INTRAVENOUS | Status: DC

## 2018-11-01 MED ORDER — LACTATED RINGERS IV SOLN
INTRAVENOUS | Status: DC | PRN
  Administered 2018-11-01 (×2): via INTRAVENOUS

## 2018-11-01 MED ORDER — PANTOPRAZOLE SODIUM 40 MG PO TBEC: 40.0000 mg | DELAYED_RELEASE_TABLET | Freq: Two times a day (BID) | ORAL | 4 refills | Status: DC

## 2018-11-01 MED ORDER — ACETAMINOPHEN 325 MG PO TABS: 650.0000 mg | ORAL_TABLET | Freq: Four times a day (QID) | ORAL | 0 refills | Status: AC | PRN

## 2018-11-01 MED ORDER — ONDANSETRON HCL 4 MG PO TABS: 4.0000 mg | ORAL_TABLET | Freq: Four times a day (QID) | ORAL | 0 refills | Status: DC | PRN

## 2018-11-01 MED ORDER — LIDOCAINE VISCOUS HCL 2 % MT SOLN: 5.0000 mL | Freq: Once | OROMUCOSAL | Status: DC

## 2018-11-01 MED ORDER — LIDOCAINE VISCOUS HCL 2 % MT SOLN
OROMUCOSAL | Status: AC
  Filled 2018-11-01: qty 15

## 2018-11-01 MED ORDER — KETAMINE HCL 10 MG/ML IJ SOLN
INTRAMUSCULAR | Status: DC | PRN
  Administered 2018-11-01: 10 mg via INTRAVENOUS

## 2018-11-01 MED ORDER — PROPOFOL 10 MG/ML IV BOLUS
INTRAVENOUS | Status: DC | PRN
  Administered 2018-11-01 (×2): 20 mg via INTRAVENOUS

## 2018-11-01 MED ORDER — PROPOFOL 500 MG/50ML IV EMUL
INTRAVENOUS | Status: DC | PRN
  Administered 2018-11-01: 150 ug/kg/min via INTRAVENOUS

## 2018-11-01 NOTE — Discharge Summary (Signed)
Catherine Short, is a 58 y.o. female  DOB September 22, 1960  MRN [id number].  Admission date:  10/31/2018  Admitting Physician  Vianne Bulls, MD  Discharge Date:  11/01/2018   Primary MD  Loman Brooklyn, FNP  Recommendations for primary care physician for things to follow:   1)Eat little small amounts, but eat frequently:,  Avoid spicy foods, Avoid alcohol, Avoid acidic foods (citrus like Orange and Lemon), Avoid fried foods Avoid foods that are pickled, dried, salted, or smoked,  Avoid salty foods, Avoid fatty foods Avoid alcohol Avoid tomato based products Avoid caffeinated foods, Tea and Colas  2)Avoid ibuprofen/Advil/Aleve/Motrin/Goody Powders/Naproxen/BC powders/Meloxicam/Diclofenac/Indomethacin and other Nonsteroidal anti-inflammatory medications as these will make you more likely to bleed and can cause stomach ulcers, can also cause Kidney problems.   3) you need repeat upper endoscopy/EGD in 8 to 12 weeks  4) follow-up with gastroenterologist for the results of your biopsies  Admission Diagnosis  Gastrointestinal hemorrhage, unspecified gastrointestinal hemorrhage type [K92.2]   Discharge Diagnosis  Gastrointestinal hemorrhage, unspecified gastrointestinal hemorrhage type [K92.2]    Principal Problem:   Acute GI bleeding Active Problems:   Anxiety   Anemia      Past Medical History:  Diagnosis Date  . Allergy   . Anxiety   . Chronic constipation   . Colitis   . Depression   . Fibroid   . H/O cold sores   . H/O removal of cyst 10/2018   right arm   . Headache   . Hormone replacement therapy (HRT) 11/14/2014  . Hot flashes 11/14/2014  . Increased endometrial stripe thickness 05/30/2015   Will get biopsy  . Insomnia   . Liver cyst   . Menopause 11/14/2014  . [place] 05/23/2015  . Nerves 11/14/2014  . Night sweats 11/14/2014  . PMB (postmenopausal bleeding) 05/23/2015  . Prolactinoma  ([place])   . Rectocele 05/23/2015  . Rotator cuff syndrome of left shoulder 10/30/2010    Past Surgical History:  Procedure Laterality Date  . ABDOMINAL SURGERY    . APPENDECTOMY    . AUGMENTATION MAMMAPLASTY  2000   saline implants  . BREAST ENHANCEMENT SURGERY  1996  . CARPAL TUNNEL RELEASE    . LAPAROSCOPIC RIGHT HEMI COLECTOMY Right 02/27/2018   Procedure: LAPAROSCOPIC RIGHT HEMI COLECTOMY;  Surgeon: [name] Signs, MD;  Location: [facility];  Service: General;  Laterality: Right;  . LAPAROTOMY N/A 02/27/2018   Procedure: EXPLORATORY LAPAROTOMY;  Surgeon: [name] Signs, MD;  Location: [facility];  Service: General;  Laterality: N/A;  . TONSILECTOMY, ADENOIDECTOMY, BILATERAL MYRINGOTOMY AND TUBES         HPI  from the history and physical done on the day of admission:    - Patient coming from: Home   Chief Complaint: Abdominal discomfort, nausea, bloody stool   HPI: Catherine Short is a 58 y.o. female with medical history significant for anxiety, collagenous colitis, cecal volvulus status post right hemicolectomy, now presenting to the emergency department for evaluation of abdominal pain, nausea, vomiting, and bloody  stools.  Patient reports roughly 3 days of abdominal discomfort, described as cramping, as well as indigestion and nausea.  She has felt as though she wanted to vomit but has not.  She went on to develop loose stools, reports having a very dark stool this afternoon, and then later what appeared to be red blood.  She has developed lightheadedness today.  She denies any fevers, chest pain, or headaches.  She reports having another grossly bloody bowel movement in the emergency department.  She reports daily naproxen use.  ED Course: Upon arrival to the ED, patient is found to be afebrile, saturating well on room air, initial blood pressure 88/55, and normal heart rate.  Chemistry panel is notable for BUN of 45 with creatinine 0.88.  CBC features a hemoglobin of 10.9, down from 11.7  in August.  Fecal occult blood testing is positive.  CT of the abdomen and pelvis demonstrates gastric wall thickening without discrete ulcer cavity or evidence for perforation.  Type and screen was performed and the patient was treated with a liter of normal saline and IV Protonix.  Gastroenterology was consulted by the ED physician.  COVID-19 testing is in process.    Hospital Course:     1) acute GI bleed-EGD on 11/01/2018 with Three non-bleeding cratered gastric ulcers with no stigmata of bleeding in the prepyloric region of the stomach. --Patient with UGI BLEED DUE TO NSAID INDUCED GASTRIC ULCERS and MILD NSAID Gastritis. -Avoidance of NSAIDs advised -Protonix 40 twice daily, -Outpatient follow-up with GI for repeat EGD in 8 to 12 weeks advised -Gastritis and ulcer diet reviewed-please see discharge instructions outlined  2)Anxiety--stable, continue Effexor  3) possible UTI--patient completed Macrodantin -Dyspnea asymptomatic  4) acute blood loss anemia--11 down to 9.8 from a baseline of somewhere between 11 and 13 due to acute GI bleed as above #1  Discharge Condition: stable  Follow UP--GI for repeat endoscopy in 8 to 12 weeks  Diet and Activity recommendation:  As advised  Discharge Instructions    Discharge Instructions    Call MD for:  difficulty breathing, headache or visual disturbances   Complete by: As directed    Call MD for:  persistant dizziness or light-headedness   Complete by: As directed    Call MD for:  persistant nausea and vomiting   Complete by: As directed    Call MD for:  severe uncontrolled pain   Complete by: As directed    Call MD for:  temperature >100.4   Complete by: As directed    Diet - low sodium heart healthy   Complete by: As directed    1)Eat little small amounts, but eat frequently:,  Avoid spicy foods, Avoid alcohol, Avoid acidic foods (citrus like Orange and Lemon), Avoid fried foods Avoid foods that are pickled, dried, salted, or  smoked,  Avoid salty foods, Avoid fatty foods Avoid alcohol Avoid tomato based products Avoid caffeinated foods, Tea and Colas   Discharge instructions   Complete by: As directed    1)Eat little small amounts, but eat frequently:,  Avoid spicy foods, Avoid alcohol, Avoid acidic foods (citrus like Orange and Lemon), Avoid fried foods Avoid foods that are pickled, dried, salted, or smoked,  Avoid salty foods, Avoid fatty foods Avoid alcohol Avoid tomato based products Avoid caffeinated foods, Tea and Colas  2)Avoid ibuprofen/Advil/Aleve/Motrin/Goody Powders/Naproxen/BC powders/Meloxicam/Diclofenac/Indomethacin and other Nonsteroidal anti-inflammatory medications as these will make you more likely to bleed and can cause stomach ulcers, can also cause Kidney problems.  3) you need repeat upper endoscopy/EGD in 8 to 12 weeks  4) follow-up with gastroenterologist for the results of your biopsies   Increase activity slowly   Complete by: As directed        Discharge Medications     Allergies as of 11/01/2018   No Known Allergies     Medication List    STOP taking these medications   Aleve 220 MG Caps Generic drug: Naproxen Sodium   nitrofurantoin (macrocrystal-monohydrate) 100 MG capsule Commonly known as: MACROBID     TAKE these medications   acetaminophen 325 MG tablet Commonly known as: TYLENOL Take 2 tablets (650 mg total) by mouth every 6 (six) hours as needed for mild pain (or Fever >/= 101).   AMINO ACID PO Take 1 tablet by mouth every morning.   ASHWAGANDHA PO Take 1 tablet by mouth every morning.   COLLAGEN PO Take 1 tablet by mouth every morning.   FISH OIL PO Take 1 capsule by mouth every evening. DHA and EFA-daily   GLUTATHIONE PO Take 500 mg by mouth daily.   levocetirizine 5 MG tablet Commonly known as: XYZAL Take 5 mg by mouth 4 (four) times a week.   linaclotide 145 MCG Caps capsule Commonly known as: Linzess Take 1 capsule (145 mcg total)  by mouth daily before breakfast.   Na Sulfate-K Sulfate-Mg Sulf 17.5-3.13-1.6 GM/177ML Soln Suprep-Use as directed What changed:   how much to take  how to take this  when to take this   ondansetron 4 MG tablet Commonly known as: ZOFRAN Take 1 tablet (4 mg total) by mouth every 6 (six) hours as needed for nausea or vomiting.   pantoprazole 40 MG tablet Commonly known as: Protonix Take 1 tablet (40 mg total) by mouth 2 (two) times daily before a meal.   Premarin vaginal cream Generic drug: conjugated estrogens Place 1 Applicatorful vaginally at bedtime.   tretinoin 0.1 % cream Commonly known as: RETIN-A Apply 1 application topically at bedtime.   venlafaxine XR 37.5 MG 24 hr capsule Commonly known as: Effexor XR Take 1 capsule (37.5 mg total) by mouth daily with breakfast.   Vitamin D-3 25 MCG (1000 UT) Caps Take 1,000 Units by mouth every morning.   vitamin E 400 UNIT capsule Take 400 Units by mouth daily.      Major procedures and Radiology Reports - PLEASE review detailed and final reports for all details, in brief -   Ct Abdomen Pelvis W Contrast  Result Date: 10/31/2018 CLINICAL DATA:  Upset stomach all week and, generalized acute abdominal pain, stool was red with coffee-ground material, dizziness, nausea EXAM: CT ABDOMEN AND PELVIS WITH CONTRAST TECHNIQUE: Multidetector CT imaging of the abdomen and pelvis was performed using the standard protocol following bolus administration of intravenous contrast. Sagittal and coronal MPR images reconstructed from axial data set. CONTRAST:  166mL OMNIPAQUE IOHEXOL 300 MG/ML SOLN IV. No oral contrast. COMPARISON:  09/04/2018 FINDINGS: Lower chest: Minimal dependent atelectasis in the lower lobes Hepatobiliary: Small posterior RIGHT lobe liver cyst 1.5 x 1.3 cm image 25. Tiny nonspecific low-attenuation focus superiorly in RIGHT lobe liver too small to characterize. No additional hepatic mass lesion. Gallbladder contracted.  Pancreas: Unremarkable Spleen: Normal appearance Adrenals/Urinary Tract: Adrenal glands normal appearance. Small extrarenal pelves bilaterally. Kidneys, ureters, and bladder otherwise normal appearance. Stomach/Bowel: Diffuse wall thickening of the gastric antrum question gastritis. No discrete ulcer cavity or perigastric inflammatory/infiltrative changes seen. No extraluminal gas. Appendix surgically absent by history. Bowel anastomotic staple  line in the RIGHT mid abdomen consistent with prior RIGHT hemicolectomy. Remaining bowel loops normal appearance Vascular/Lymphatic: Aorta normal caliber. Retroaortic LEFT renal vein. Vascular structures patent. No adenopathy. Scattered pelvic phleboliths. Reproductive: Unremarkable uterus and adnexa Other: Tiny umbilical hernia containing fat. No free air or free fluid. Musculoskeletal: Degenerative disc disease changes L2-L3 with retrolisthesis. No acute osseous findings. IMPRESSION: Prior RIGHT hemicolectomy. Gastric antral wall thickening question gastritis. No discrete ulcer cavity or evidence of perigastric infiltration/extraluminal gas to suggest perforated ulcer, recommend correlation with endoscopy. Remainder of exam unremarkable. Electronically Signed   By: Lavonia Dana M.D.   On: 10/31/2018 18:57   Micro Results   Recent Results (from the past 240 hour(s))  SARS Coronavirus 2 by RT PCR (hospital order, performed in [facility] hospital lab) Nasopharyngeal Nasopharyngeal Swab     Status: None   Collection Time: 10/31/18  9:42 PM   Specimen: Nasopharyngeal Swab  Result Value Ref Range Status   SARS Coronavirus 2 NEGATIVE NEGATIVE Final    Comment: (NOTE) If result is NEGATIVE SARS-CoV-2 target nucleic acids are NOT DETECTED. The SARS-CoV-2 RNA is generally detectable in upper and lower  respiratory specimens during the acute phase of infection. The lowest  concentration of SARS-CoV-2 viral copies this assay can detect is 250  copies / mL. A negative  result does not preclude SARS-CoV-2 infection  and should not be used as the sole basis for treatment or other  patient management decisions.  A negative result may occur with  improper specimen collection / handling, submission of specimen other  than nasopharyngeal swab, presence of viral mutation(s) within the  areas targeted by this assay, and inadequate number of viral copies  (<250 copies / mL). A negative result must be combined with clinical  observations, patient history, and epidemiological information. If result is POSITIVE SARS-CoV-2 target nucleic acids are DETECTED. The SARS-CoV-2 RNA is generally detectable in upper and lower  respiratory specimens dur ing the acute phase of infection.  Positive  results are indicative of active infection with SARS-CoV-2.  Clinical  correlation with patient history and other diagnostic information is  necessary to determine patient infection status.  Positive results do  not rule out bacterial infection or co-infection with other viruses. If result is PRESUMPTIVE POSTIVE SARS-CoV-2 nucleic acids MAY BE PRESENT.   A presumptive positive result was obtained on the submitted specimen  and confirmed on repeat testing.  While 2019 novel coronavirus  (SARS-CoV-2) nucleic acids may be present in the submitted sample  additional confirmatory testing may be necessary for epidemiological  and / or clinical management purposes  to differentiate between  SARS-CoV-2 and other Sarbecovirus currently known to infect humans.  If clinically indicated additional testing with an alternate test  methodology [phone]) is advised. The SARS-CoV-2 RNA is generally  detectable in upper and lower respiratory sp ecimens during the acute  phase of infection. The expected result is Negative. Fact Sheet for Patients:  StrictlyIdeas.no Fact Sheet for Healthcare Providers: BankingDealers.co.za This test is not yet  approved or cleared by the [place] FDA and has been authorized for detection and/or diagnosis of SARS-CoV-2 by FDA under an Emergency Use Authorization (EUA).  This EUA will remain in effect (meaning this test can be used) for the duration of the COVID-19 declaration under Section 564(b)(1) of the Act, 21 U.S.C. section 360bbb-3(b)(1), unless the authorization is terminated or revoked sooner. Performed at [facility], [address], [place], [place] [address]    Today  Subjective    Catherine Short today has no new complaints -Husband at bedside, questions answered -No further emesis or diarrhea          Patient has been seen and examined prior to discharge   Objective   Blood pressure 131/89, pulse 65, temperature 98.3 F (36.8 C), resp. rate 12, height 5\' 3"  (1.6 m), weight 60.5 kg, last menstrual period 07/05/2016, SpO2 99 %.   Intake/Output Summary (Last 24 hours) at 11/01/2018 1734 Last data filed at 11/01/2018 1642 Gross per 24 hour  Intake 1000 ml  Output 0 ml  Net 1000 ml    Exam Gen:- Awake Alert, no acute distress  HEENT:- [place].AT, No sclera icterus Neck-Supple Neck,No JVD,.  Lungs-  CTAB , good air movement bilaterally  CV- S1, S2 normal, regular Abd-  +ve B.Sounds, Abd Soft, improved epigastric tenderness,    Extremity/Skin:- No  edema,   good pulses Psych-affect is appropriate, oriented x3 Neuro-no new focal deficits, no tremors    Data Review   CBC w Diff:  Lab Results  Component Value Date   WBC 10.3 10/31/2018   HGB 9.8 (L) 11/01/2018   HGB 14.2 08/19/2016   HCT 30.1 (L) 11/01/2018   HCT 42.7 08/19/2016   PLT 205 10/31/2018   PLT 228 08/19/2016   LYMPHOPCT 19 09/02/2018   MONOPCT 8 09/02/2018   EOSPCT 1 09/02/2018   BASOPCT 0 09/02/2018    CMP:  Lab Results  Component Value Date   NA 138 11/01/2018   NA 140 08/19/2016   K 4.1 11/01/2018   CL 108 11/01/2018   CO2 25 11/01/2018   BUN 33 (H) 11/01/2018   BUN 21 08/19/2016    CREATININE 0.70 11/01/2018   PROT 5.8 (L) 10/31/2018   PROT 6.6 08/19/2016   ALBUMIN 3.5 10/31/2018   ALBUMIN 4.6 08/19/2016   BILITOT 0.8 10/31/2018   BILITOT 0.5 08/19/2016   ALKPHOS 35 (L) 10/31/2018   AST 23 10/31/2018   ALT 24 10/31/2018  .   Total Discharge time is about 33 minutes  Roxan Hockey M.D on 11/01/2018 at 5:34 PM  Go to [email or web address] -  for contact info  [facility] - Office  [phone]

## 2018-11-01 NOTE — Discharge Instructions (Signed)
1)Eat little small amounts, but eat frequently:,  Avoid spicy foods, Avoid alcohol, Avoid acidic foods (citrus like Orange and Lemon), Avoid fried foods Avoid foods that are pickled, dried, salted, or smoked,  Avoid salty foods, Avoid fatty foods Avoid alcohol Avoid tomato based products Avoid caffeinated foods, Tea and Colas  2)Avoid ibuprofen/Advil/Aleve/Motrin/Goody Powders/Naproxen/BC powders/Meloxicam/Diclofenac/Indomethacin and other Nonsteroidal anti-inflammatory medications as these will make you more likely to bleed and can cause stomach ulcers, can also cause Kidney problems.   3) you need repeat upper endoscopy/EGD in 8 to 12 weeks  4) follow-up with gastroenterologist for the results of your biopsies

## 2018-11-01 NOTE — Anesthesia Postprocedure Evaluation (Signed)
Anesthesia Post Note  Patient: Catherine Short  Procedure(s) Performed: ESOPHAGOGASTRODUODENOSCOPY (EGD) WITH PROPOFOL (N/A ) BIOPSY  Patient location during evaluation: PACU Anesthesia Type: General Level of consciousness: awake and alert and oriented Pain management: pain level controlled Vital Signs Assessment: post-procedure vital signs reviewed and stable Respiratory status: spontaneous breathing Cardiovascular status: blood pressure returned to baseline and stable Postop Assessment: no apparent nausea or vomiting Anesthetic complications: no     Last Vitals:  Vitals:   11/01/18 0850 11/01/18 1446  BP: 113/78 110/68  Pulse: 70 68  Resp: 18 14  Temp: 36.7 C 36.7 C  SpO2: 100% 97%    Last Pain:  Vitals:   11/01/18 1446  TempSrc: Oral  PainSc: 8                  Mehdi Gironda

## 2018-11-01 NOTE — Transfer of Care (Signed)
Immediate Anesthesia Transfer of Care Note  Patient: Catherine Short  Procedure(s) Performed: ESOPHAGOGASTRODUODENOSCOPY (EGD) WITH PROPOFOL (N/A ) BIOPSY  Patient Location: PACU  Anesthesia Type:General  Level of Consciousness: awake  Airway & Oxygen Therapy: Patient Spontanous Breathing  Post-op Assessment: Report given to RN  Post vital signs: Reviewed and stable  Last Vitals:  Vitals Value Taken Time  BP 124/105 11/01/18 1641  Temp    Pulse 66 11/01/18 1643  Resp 16 11/01/18 1643  SpO2 100 % 11/01/18 1643  Vitals shown include unvalidated device data.  Last Pain:  Vitals:   11/01/18 1446  TempSrc: Oral  PainSc: 8          Complications: No apparent anesthesia complications

## 2018-11-01 NOTE — Interval H&P Note (Signed)
History and Physical Interval Note:  11/01/2018 4:06 PM  Catherine Short  has presented today for surgery, with the diagnosis of Anemia, heme positive stool, patient reporting black stools.  The various methods of treatment have been discussed with the patient and family. After consideration of risks, benefits and other options for treatment, the patient has consented to  Procedure(s): ESOPHAGOGASTRODUODENOSCOPY (EGD) WITH PROPOFOL (N/A) as a surgical intervention.  The patient's history has been reviewed, patient examined, no change in status, stable for surgery.  I have reviewed the patient's chart and labs.  Questions were answered to the patient's satisfaction.     [employer]

## 2018-11-01 NOTE — Op Note (Signed)
[facility] Patient Name: Catherine Short Procedure Date: 11/01/2018 3:40 PM MRN: [id number] Date of Birth: 1960-04-08 Attending MD: Barney Drain MD, MD CSN: [id number] Age: 58 Admit Type: Outpatient Procedure:                Upper GI endoscopy WITH COLD FORCEPS BIOPSY Indications:              Melena Providers:                Barney Drain MD, MD, Janeece Riggers, RN, Aram Candela Referring MD:             Loman Brooklyn Medicines:                Propofol per Anesthesia Complications:            No immediate complications. Estimated Blood Loss:     Estimated blood loss was minimal. Procedure:                Pre-Anesthesia Assessment:                           - Prior to the procedure, a History and Physical                            was performed, and patient medications and                            allergies were reviewed. The patient's tolerance of                            previous anesthesia was also reviewed. The risks                            and benefits of the procedure and the sedation                            options and risks were discussed with the patient.                            All questions were answered, and informed consent                            was obtained. Prior Anticoagulants: The patient has                            taken no previous anticoagulant or antiplatelet                            agents except for NSAID medication. ASA Grade                            Assessment: II - A patient with mild systemic                            disease. After reviewing the risks and benefits,  the patient was deemed in satisfactory condition to                            undergo the procedure. After obtaining informed                            consent, the endoscope was passed under direct                            vision. Throughout the procedure, the patient's                            blood pressure, pulse, and oxygen  saturations were                            monitored continuously. The [id number] was                            introduced through the mouth, and advanced to the                            second part of duodenum. The upper GI endoscopy was                            accomplished without difficulty. The patient                            tolerated the procedure well. Scope In: 4:26:01 PM Scope Out: 4:30:59 PM Total Procedure Duration: 0 hours 4 minutes 58 seconds  Findings:      Localized mild inflammation characterized by congestion (edema) and       erythema was found in the gastric antrum. Biopsies were taken with a       cold forceps for Helicobacter pylori testing.      The examined duodenum was normal.      The examined esophagus was normal.      Three non-bleeding cratered gastric ulcers with no stigmata of bleeding       were found in the prepyloric region of the stomach. Impression:               - UGI BLEED DUE TO NSAID INDUCED GASTRIC ULCERS.                           - MILD NSAID Gastritis. Moderate Sedation:      Per Anesthesia Care Recommendation:           - Return patient to hospital ward for ongoing care.                           - Soft diet and low fat diet.                           - Continue present medications.                           - Await pathology results.                           -  Repeat upper endoscopy in 3 months for                            surveillance.                           - Return to GI office in 6 months. Procedure Code(s):        --- Professional ---                           [phone], Esophagogastroduodenoscopy, flexible,                            transoral; with biopsy, single or multiple Diagnosis Code(s):        --- Professional ---                           K22.10, Ulcer of esophagus without bleeding                           K29.70, Gastritis, unspecified, without bleeding                           K92.1, Melena (includes  Hematochezia) CPT copyright 2019 American Medical Association. All rights reserved. The codes documented in this report are preliminary and upon coder review may  be revised to meet current compliance requirements. Barney Drain, MD Barney Drain MD, MD 11/01/2018 4:50:29 PM This report has been signed electronically. Number of Addenda: 0

## 2018-11-01 NOTE — ED Notes (Signed)
Pt made aware we need stool sample.

## 2018-11-01 NOTE — H&P (View-Only) (Signed)
Referring Provider: Evalee Jefferson, PA-C Primary Care Physician:  Loman Brooklyn, FNP Primary Gastroenterologist:  [facility]  Date of Admission: 10/31/18 Date of Consultation: 11/01/18  Reason for Consultation: Anemia, suspected upper GI bleed  HPI:  Catherine Short is a 58 y.o. 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  Associated indigestion, nausea, reports very dark stool as well, and lightheadedness. She had additional gross bloody BM in the ED.    ED course: Vitals significant for hypertension with blood pressure of 88/55 otherwise vitals within normal limits.  Initial CMP significant for BUN elevated at 45 with creatinine 0.88.  CBC with hemoglobin of 10.9, down from 11.7 in August.  Fecal occult blood testing positive.  CT abdomen and pelvis demonstrated gastric wall thickening without discrete ulcer cavity or evidence of perforation.  COVID-19 testing negative.  She is started on IV Protonix twice daily, given IV fluids, and made NPO.  Repeat hemoglobin this morning down to 9.8.  BUN 33.  Today she states Friday, she had a UTI. She had "stomach ache" with this. Patient is pointing to the upper abdomen. Stomach ache continued over the weekend. Saturday night, she was up all night having watery diarrhea. Thought symptoms were related to antibiotics. Yesterday at work, she started having a lot of nausea, diaphoresis, dizziness, and felt swimmy headed.  Denies ever vomiting.  Had urgent BM that was watery/bloody. Reports bloody stools with black stool with coffee ground appearance. No bright red blood. Some opaque red color, but mostly black. Felt she would pass out. Had a second episode of significant nausea and dizziness  in the ED waiting room. Reports no further BM since yesterday at work. No black stools in the past. Reports years ago, she had bright red blood per rectum when she was diagnosed with microscopic colitis. None since. Was on Lialda for 6 months and symptoms subsided. No meds in the last 9 years or so. Denies vomiting. No regular heartburn, indigestion, or reflux symptoms. No dysphagia. Intermittent epigastric pain over the last 6 months. Few times a week. No identified triggers. About 5/10. Increasing in frequency. Will take a tums and helps resolve symptoms. No unintentional weight loss recently. No nausea prior to this acute episode. Taking Aleve. Takes 1 every morning with a meal. Has been taking for years. Will take Excedrin when she gets a headache. Recently about twice a week for the last month.   Alcohol: about 4 glasses of wine a week.  Ambien about once a month for insomnia.    Patient follows with [facility] and recently saw them on 10/24/2018 with plans to pursue colonoscopy. Scheduled for 11/22/18.    Past Medical History:  Diagnosis Date  . Allergy   . Anxiety   . Chronic constipation   . Colitis   . Depression   . Fibroid   . H/O cold sores   . H/O removal of cyst 10/2018   right arm   . Hormone replacement therapy (HRT) 11/14/2014  . Hot flashes 11/14/2014  . Increased endometrial stripe thickness 05/30/2015   Will get biopsy  . Insomnia   . Liver cyst   . Menopause 11/14/2014  . [place] 05/23/2015  . Nerves 11/14/2014  . Night sweats 11/14/2014  . PMB (postmenopausal bleeding) 05/23/2015  .  Prolactinoma ([place])   . Rectocele 05/23/2015  . Rotator cuff syndrome of left shoulder 10/30/2010    Past Surgical History:  Procedure Laterality Date  . ABDOMINAL SURGERY    . AUGMENTATION MAMMAPLASTY  2000   saline implants  . BREAST ENHANCEMENT SURGERY  1996  . CARPAL TUNNEL RELEASE    . LAPAROSCOPIC RIGHT HEMI COLECTOMY Right 02/27/2018   Procedure: LAPAROSCOPIC RIGHT HEMI  COLECTOMY;  Surgeon: [name] Signs, MD;  Location: [facility];  Service: General;  Laterality: Right;  . LAPAROTOMY N/A 02/27/2018   Procedure: EXPLORATORY LAPAROTOMY;  Surgeon: [name] Signs, MD;  Location: [facility];  Service: General;  Laterality: N/A;  . TONSILECTOMY, ADENOIDECTOMY, BILATERAL MYRINGOTOMY AND TUBES      Prior to Admission medications   Medication Sig Start Date End Date Taking? Authorizing Provider  Amino Acids (AMINO ACID PO) Take 1 tablet by mouth every morning.   Yes [provider]  ASHWAGANDHA PO Take 1 tablet by mouth every morning.    Yes [provider]  Cholecalciferol (VITAMIN D-3) 25 MCG (1000 UT) CAPS Take 1,000 Units by mouth every morning.    Yes [provider]  COLLAGEN PO Take 1 tablet by mouth every morning.    Yes [provider]  GLUTATHIONE PO Take 500 mg by mouth daily.   Yes [provider]  levocetirizine (XYZAL) 5 MG tablet Take 5 mg by mouth 4 (four) times a week.    Yes [provider]  Naproxen Sodium (ALEVE) 220 MG CAPS Take 1 capsule by mouth every morning.    Yes [provider]  nitrofurantoin, macrocrystal-monohydrate, (MACROBID) 100 MG capsule Take 100 mg by mouth every 12 (twelve) hours. 7 day course starting on 10/28/2018 10/28/18  Yes [provider]  Omega-3 Fatty Acids (FISH OIL PO) Take 1 capsule by mouth every evening. DHA and EFA-daily    Yes [provider]  PREMARIN vaginal cream Place 1 Applicatorful vaginally at bedtime.  10/24/18  Yes [provider]  tretinoin (RETIN-A) 0.1 % cream Apply 1 application topically at bedtime. 10/28/18  Yes [provider]  venlafaxine XR (EFFEXOR XR) 37.5 MG 24 hr capsule Take 1 capsule (37.5 mg total) by mouth daily with breakfast. 10/07/18  Yes Hendricks Limes F, FNP  vitamin E 400 UNIT capsule Take 400 Units by mouth daily.    Yes [provider]  linaclotide Rolan Lipa) 145 MCG CAPS capsule Take 1  capsule (145 mcg total) by mouth daily before breakfast. Patient not taking: Reported on 10/31/2018 10/24/18   [name], [name] S, PA-C  Na Sulfate-K Sulfate-Mg Sulf 17.5-3.13-1.6 GM/177ML SOLN Suprep-Use as directed Patient taking differently: Take 1 kit by mouth as directed. Suprep-Use as directed 10/24/18   [name], [name] S, PA-C    Current Facility-Administered Medications  Medication Dose Route Frequency Provider Last Rate Last Dose  . 0.9 %  sodium chloride infusion   Intravenous Continuous Opyd, Ilene Qua, MD 110 mL/hr at 11/01/18 0100    . acetaminophen (TYLENOL) tablet 650 mg  650 mg Oral Q6H PRN Opyd, Ilene Qua, MD       Or  . acetaminophen (TYLENOL) suppository 650 mg  650 mg Rectal Q6H PRN Opyd, Ilene Qua, MD      . nitrofurantoin (macrocrystal-monohydrate) (MACROBID) capsule 100 mg  100 mg Oral Q12H Opyd, Ilene Qua, MD      . ondansetron (ZOFRAN) tablet 4 mg  4 mg Oral Q6H PRN Opyd, Ilene Qua, MD  Or  . ondansetron (ZOFRAN) injection 4 mg  4 mg Intravenous Q6H PRN Opyd, Ilene Qua, MD      . pantoprazole (PROTONIX) injection 40 mg  40 mg Intravenous Q12H Opyd, Ilene Qua, MD   40 mg at 11/01/18 0559  . venlafaxine XR (EFFEXOR-XR) 24 hr capsule 37.5 mg  37.5 mg Oral Q breakfast Opyd, Ilene Qua, MD       Current Outpatient Medications  Medication Sig Dispense Refill  . Amino Acids (AMINO ACID PO) Take 1 tablet by mouth every morning.    . ASHWAGANDHA PO Take 1 tablet by mouth every morning.     . Cholecalciferol (VITAMIN D-3) 25 MCG (1000 UT) CAPS Take 1,000 Units by mouth every morning.     . COLLAGEN PO Take 1 tablet by mouth every morning.     [name] Kitchen GLUTATHIONE PO Take 500 mg by mouth daily.    [name] Kitchen levocetirizine (XYZAL) 5 MG tablet Take 5 mg by mouth 4 (four) times a week.     . Naproxen Sodium (ALEVE) 220 MG CAPS Take 1 capsule by mouth every morning.     . nitrofurantoin, macrocrystal-monohydrate, (MACROBID) 100 MG capsule Take 100 mg by mouth every 12 (twelve) hours. 7 day  course starting on 10/28/2018    . Omega-3 Fatty Acids (FISH OIL PO) Take 1 capsule by mouth every evening. DHA and EFA-daily     . PREMARIN vaginal cream Place 1 Applicatorful vaginally at bedtime.     . tretinoin (RETIN-A) 0.1 % cream Apply 1 application topically at bedtime.    [name] Kitchen venlafaxine XR (EFFEXOR XR) 37.5 MG 24 hr capsule Take 1 capsule (37.5 mg total) by mouth daily with breakfast. 30 capsule 2  . vitamin E 400 UNIT capsule Take 400 Units by mouth daily.     [name] Kitchen linaclotide (LINZESS) 145 MCG CAPS capsule Take 1 capsule (145 mcg total) by mouth daily before breakfast. (Patient not taking: Reported on 10/31/2018) 30 capsule 11  . Na Sulfate-K Sulfate-Mg Sulf 17.5-3.13-1.6 GM/177ML SOLN Suprep-Use as directed (Patient taking differently: Take 1 kit by mouth as directed. Suprep-Use as directed) 354 mL 0    Allergies as of 10/31/2018  . (No Known Allergies)    Family History  Problem Relation Age of Onset  . Other Mother        digestive type issues  . Diabetes Mother   . Hypertension Mother   . Heart attack Father   . Heart disease Father   . Early death Brother        MVA  . Uterine cancer Maternal Grandmother   . Emphysema Maternal Grandfather   . Alcohol abuse Brother   . Colon cancer Paternal Grandfather     Social History   Socioeconomic History  . Marital status: Married    Spouse name: Not on file  . Number of children: 1  . Years of education: Not on file  . Highest education level: Not on file  Occupational History  . Occupation: Best boy: [employer]  Social Needs  . Financial resource strain: Not on file  . Food insecurity    Worry: Not on file    Inability: Not on file  . Transportation needs    Medical: Not on file    Non-medical: Not on file  Tobacco Use  . Smoking status: Former Smoker    Years: 6.00    Types: Cigarettes  . Smokeless tobacco: Never Used  Substance and Sexual Activity  . Alcohol use: Yes     Alcohol/week: 2.0 standard drinks    Types: 2 Glasses of wine per week    Comment: twice a week  . Drug use: No  . Sexual activity: Yes    Partners: Male    Birth control/protection: None, Other-see comments    Comment: husband with vasectomy  Lifestyle  . Physical activity    Days per week: Not on file    Minutes per session: Not on file  . Stress: Not on file  Relationships  . Social Herbalist on phone: Not on file    Gets together: Not on file    Attends religious service: Not on file    Active member of club or organization: Not on file    Attends meetings of clubs or organizations: Not on file    Relationship status: Not on file  . Intimate partner violence    Fear of current or ex partner: Not on file    Emotionally abused: Not on file    Physically abused: Not on file    Forced sexual activity: Not on file  Other Topics Concern  . Not on file  Social History Narrative  . Not on file    Review of Systems: Gen: Denies fever. Had chills yesterday.  CV: Denies chest pain, heart palpitations Resp: Denies shortness of breath or cough.  GI: Denies dysphagia or odynophagia. Denies vomiting blood, jaundice, and fecal incontinence.  GU : Denies urinary burning, urinary frequency, urinary incontinence. Had urinary burning and hematuria that has resolved.  MS: Joint pain in hands Derm: Denies rash Psych: Admits to depression and anxiety. Mainly anxiety.  Heme: Denies bruising, bleeding, and enlarged lymph nodes.  Physical Exam: Vital signs in last 24 hours: Temp:  [98 F (36.7 C)] 98 F (36.7 C) (10/26 1629) Pulse Rate:  [63-72] 65 (10/27 0800) Resp:  [16-20] 16 (10/27 0605) BP: (88-106)/(55-75) 106/72 (10/27 0800) SpO2:  [97 %-100 %] 98 % (10/27 0800) Weight:  [60.8 kg] 60.8 kg (10/26 1629)   General:   Alert,  Well-developed, well-nourished, pleasant and cooperative in NAD Head:  Normocephalic and atraumatic. Eyes:  Sclera clear, no icterus.    Conjunctiva pink. Ears:  Normal auditory acuity. Nose:  No deformity, discharge,  or lesions. Mouth:  No deformity or lesions, dentition normal. Lungs:  Clear throughout to auscultation.   No wheezes, crackles, or rhonchi. No acute distress. Heart:  Regular rate and rhythm; no murmurs, clicks, rubs,  or gallops. Abdomen:  Soft and nondistended.  Moderate tenderness to palpation in the epigastric area.  No masses, hepatosplenomegaly or hernias noted. Normal bowel sounds, without guarding, and without rebound.   Rectal:  Deferred  Msk:  Symmetrical without gross deformities. Normal posture. Extremities:  Without edema. Neurologic:  Alert and  oriented x4;  grossly normal neurologically. Skin:  Intact without significant lesions or rashes. Psych: Normal mood and affect.  Lab Results: Recent Labs    10/31/18 1647 11/01/18 0015  WBC 10.3  --   HGB 10.9* 9.8*  HCT 34.3* 30.1*  PLT 205  --    BMET Recent Labs    10/31/18 1647 11/01/18 0015  NA 136 138  K 4.4 4.1  CL 104 108  CO2 24 25  GLUCOSE 158* 111*  BUN 45* 33*  CREATININE 0.88 0.70  CALCIUM 8.6* 8.0*   LFT Recent Labs    10/31/18 1647  PROT 5.8*  ALBUMIN 3.5  AST 23  ALT 24  ALKPHOS 35*  BILITOT 0.8   Studies/Results: Ct Abdomen Pelvis W Contrast  Result Date: 10/31/2018 CLINICAL DATA:  Upset stomach all week and, generalized acute abdominal pain, stool was red with coffee-ground material, dizziness, nausea EXAM: CT ABDOMEN AND PELVIS WITH CONTRAST TECHNIQUE: Multidetector CT imaging of the abdomen and pelvis was performed using the standard protocol following bolus administration of intravenous contrast. Sagittal and coronal MPR images reconstructed from axial data set. CONTRAST:  123m OMNIPAQUE IOHEXOL 300 MG/ML SOLN IV. No oral contrast. COMPARISON:  09/04/2018 FINDINGS: Lower chest: Minimal dependent atelectasis in the lower lobes Hepatobiliary: Small posterior RIGHT lobe liver cyst 1.5 x 1.3 cm image 25. Tiny  nonspecific low-attenuation focus superiorly in RIGHT lobe liver too small to characterize. No additional hepatic mass lesion. Gallbladder contracted. Pancreas: Unremarkable Spleen: Normal appearance Adrenals/Urinary Tract: Adrenal glands normal appearance. Small extrarenal pelves bilaterally. Kidneys, ureters, and bladder otherwise normal appearance. Stomach/Bowel: Diffuse wall thickening of the gastric antrum question gastritis. No discrete ulcer cavity or perigastric inflammatory/infiltrative changes seen. No extraluminal gas. Appendix surgically absent by history. Bowel anastomotic staple line in the RIGHT mid abdomen consistent with prior RIGHT hemicolectomy. Remaining bowel loops normal appearance Vascular/Lymphatic: Aorta normal caliber. Retroaortic LEFT renal vein. Vascular structures patent. No adenopathy. Scattered pelvic phleboliths. Reproductive: Unremarkable uterus and adnexa Other: Tiny umbilical hernia containing fat. No free air or free fluid. Musculoskeletal: Degenerative disc disease changes L2-L3 with retrolisthesis. No acute osseous findings. IMPRESSION: Prior RIGHT hemicolectomy. Gastric antral wall thickening question gastritis. No discrete ulcer cavity or evidence of perigastric infiltration/extraluminal gas to suggest perforated ulcer, recommend correlation with endoscopy. Remainder of exam unremarkable. Electronically Signed   By: MLavonia DanaM.D.   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  Patient had normal color watery diarrhea started on Saturday night after starting antibiotic on Friday for UTI.  Thought symptoms were secondary to  antibiotics.  Monday she had acute onset of nausea without vomiting, dizziness, diaphoresis that was associated with an urgent bowel movement with black coffee ground appearance.  Some opaque red color but no bright red blood.  No other BM since yesterday.  Reports intermittent epigastric pain increasing in frequency over the last 6 months relieved by Tums.  She is not on any chronic antacid medications.  Denies GERD, heartburn, or dysphagia.  No unintentional weight loss.  Has been taking 1 Aleve every morning for years.  Also taking Excedrin about twice a week for the last month.  Drinks about 4 glasses of wine a week.  She was hypotensive initially in the emergency department with blood pressure 88/55.  BUN elevated at 45, hemoglobin 10.9 which is down from 11.7 in August.  FOBT positive.  CT abdomen and pelvis with gastric wall thickening without discrete ulcer cavity or evidence of perforation.  Hemoglobin this morning down to 9.8 without any further overt GI bleeding.  Has been n.p.o. since midnight.  Concern for NSAID induced gastritis, esophagitis, duodenitis versus peptic ulcer disease. She needs EGD for further evaluation.  Patient follows with [facility] and recently saw them on 10/24/2018 with plans to pursue colonoscopy.  Colonoscopy scheduled for 11/22/18.  Plan: Proceed with EGD with propofol today  with Dr. Oneida Alar.  Discussed with Dr. Oneida Alar who is agreeable. The risks, benefits, and alternatives have been discussed in detail with patient. They have stated understanding and desire to proceed.  Continue IV Protonix 40 mg twice daily Continue IV fluids and n.p.o. status Further recommendations to follow procedure.    LOS: 0 days    11/01/2018, 8:07 AM   Aliene Altes, [facility]

## 2018-11-01 NOTE — Anesthesia Preprocedure Evaluation (Addendum)
Anesthesia Evaluation  Patient identified by MRN, date of birth, ID band Patient awake    Reviewed: Allergy & Precautions, NPO status , Patient's Chart, lab work & pertinent test results  Airway Mallampati: II  TM Distance: >3 FB Neck ROM: Full    Dental  (+) Lower Dentures, Caps   Pulmonary former smoker,    Pulmonary exam normal        Cardiovascular Exercise Tolerance: Good negative cardio ROS Normal cardiovascular exam Rhythm:Regular Rate:Normal     Neuro/Psych  Headaches, PSYCHIATRIC DISORDERS Anxiety Depression  Neuromuscular disease    GI/Hepatic negative GI ROS, Neg liver ROS,   Endo/Other  negative endocrine ROS  Renal/GU negative Renal ROS  negative genitourinary   Musculoskeletal   Abdominal   Peds  Hematology  (+) anemia ,   Anesthesia Other Findings   Reproductive/Obstetrics                           Anesthesia Physical Anesthesia Plan  ASA: II  Anesthesia Plan: General   Post-op Pain Management:    Induction:   PONV Risk Score and Plan: TIVA  Airway Management Planned: Nasal Cannula, Natural Airway and Simple Face Mask  Additional Equipment:   Intra-op Plan:   Post-operative Plan:   Informed Consent: I have reviewed the patients History and Physical, chart, labs and discussed the procedure including the risks, benefits and alternatives for the proposed anesthesia with the patient or authorized representative who has indicated his/her understanding and acceptance.     Dental advisory given  Plan Discussed with: CRNA  Anesthesia Plan Comments:       Anesthesia Quick Evaluation

## 2018-11-01 NOTE — Consult Note (Addendum)
Referring Provider: Evalee Jefferson, PA-C Primary Care Physician:  Loman [place], FNP Primary Gastroenterologist:  [facility]  Date of Admission: 10/31/18 Date of Consultation: 11/01/18  Reason for Consultation: Anemia, suspected upper GI bleed  HPI:  Catherine Short is a 58 y.o. 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  Associated indigestion, nausea, reports very dark stool as well, and lightheadedness. She had additional gross bloody BM in the ED.    ED course: Vitals significant for hypertension with blood pressure of 88/55 otherwise vitals within normal limits.  Initial CMP significant for BUN elevated at 45 with creatinine 0.88.  CBC with hemoglobin of 10.9, down from 11.7 in August.  Fecal occult blood testing positive.  CT abdomen and pelvis demonstrated gastric wall thickening without discrete ulcer cavity or evidence of perforation.  COVID-19 testing negative.  She is started on IV Protonix twice daily, given IV fluids, and made NPO.  Repeat hemoglobin this morning down to 9.8.  BUN 33.  Today she states Friday, she had a UTI. She had "stomach ache" with this. Patient is pointing to the upper abdomen. Stomach ache continued over the weekend. Saturday night, she was up all night having watery diarrhea. Thought symptoms were related to antibiotics. Yesterday at work, she started having a lot of nausea, diaphoresis, dizziness, and felt swimmy headed.  Denies ever vomiting.  Had urgent BM that was watery/bloody. Reports bloody stools with black stool with coffee ground appearance. No bright red blood. Some opaque red color, but mostly black. Felt she would pass out. Had a second episode of significant nausea and dizziness  in the ED waiting room. Reports no further BM since yesterday at work. No black stools in the past. Reports years ago, she had bright red blood per rectum when she was diagnosed with microscopic colitis. None since. Was on Lialda for 6 months and symptoms subsided. No meds in the last 9 years or so. Denies vomiting. No regular heartburn, indigestion, or reflux symptoms. No dysphagia. Intermittent epigastric pain over the last 6 months. Few times a week. No identified triggers. About 5/10. Increasing in frequency. Will take a tums and helps resolve symptoms. No unintentional weight loss recently. No nausea prior to this acute episode. Taking Aleve. Takes 1 every morning with a meal. Has been taking for years. Will take Excedrin when she gets a headache. Recently about twice a week for the last month.   Alcohol: about 4 glasses of wine a week.  Ambien about once a month for insomnia.    Patient follows with [facility] and recently saw them on 10/24/2018 with plans to pursue colonoscopy. Scheduled for 11/22/18.    Past Medical History:  Diagnosis Date  . Allergy   . Anxiety   . Chronic constipation   . Colitis   . Depression   . Fibroid   . H/O cold sores   . H/O removal of cyst 10/2018   right arm   . Hormone replacement therapy (HRT) 11/14/2014  . Hot flashes 11/14/2014  . Increased endometrial stripe thickness 05/30/2015   Will get biopsy  . Insomnia   . Liver cyst   . Menopause 11/14/2014  . [place] 05/23/2015  . Nerves 11/14/2014  . Night sweats 11/14/2014  . PMB (postmenopausal bleeding) 05/23/2015  .  Prolactinoma ([place])   . Rectocele 05/23/2015  . Rotator cuff syndrome of left shoulder 10/30/2010    Past Surgical History:  Procedure Laterality Date  . ABDOMINAL SURGERY    . AUGMENTATION MAMMAPLASTY  2000   saline implants  . BREAST ENHANCEMENT SURGERY  1996  . CARPAL TUNNEL RELEASE    . LAPAROSCOPIC RIGHT HEMI COLECTOMY Right 02/27/2018   Procedure: LAPAROSCOPIC RIGHT HEMI  COLECTOMY;  Surgeon: [name] Signs, MD;  Location: [facility];  Service: General;  Laterality: Right;  . LAPAROTOMY N/A 02/27/2018   Procedure: EXPLORATORY LAPAROTOMY;  Surgeon: [name] Signs, MD;  Location: [facility];  Service: General;  Laterality: N/A;  . TONSILECTOMY, ADENOIDECTOMY, BILATERAL MYRINGOTOMY AND TUBES      Prior to Admission medications   Medication Sig Start Date End Date Taking? Authorizing Provider  Amino Acids (AMINO ACID PO) Take 1 tablet by mouth every morning.   Yes [provider]  ASHWAGANDHA PO Take 1 tablet by mouth every morning.    Yes [provider]  Cholecalciferol (VITAMIN D-3) 25 MCG (1000 UT) CAPS Take 1,000 Units by mouth every morning.    Yes [provider]  COLLAGEN PO Take 1 tablet by mouth every morning.    Yes [provider]  GLUTATHIONE PO Take 500 mg by mouth daily.   Yes [provider]  levocetirizine (XYZAL) 5 MG tablet Take 5 mg by mouth 4 (four) times a week.    Yes [provider]  Naproxen Sodium (ALEVE) 220 MG CAPS Take 1 capsule by mouth every morning.    Yes [provider]  nitrofurantoin, macrocrystal-monohydrate, (MACROBID) 100 MG capsule Take 100 mg by mouth every 12 (twelve) hours. 7 day course starting on 10/28/2018 10/28/18  Yes [provider]  Omega-3 Fatty Acids (FISH OIL PO) Take 1 capsule by mouth every evening. DHA and EFA-daily    Yes [provider]  PREMARIN vaginal cream Place 1 Applicatorful vaginally at bedtime.  10/24/18  Yes [provider]  tretinoin (RETIN-A) 0.1 % cream Apply 1 application topically at bedtime. 10/28/18  Yes [provider]  venlafaxine XR (EFFEXOR XR) 37.5 MG 24 hr capsule Take 1 capsule (37.5 mg total) by mouth daily with breakfast. 10/07/18  Yes Hendricks Limes F, FNP  vitamin E 400 UNIT capsule Take 400 Units by mouth daily.    Yes [provider]  linaclotide Rolan Lipa) 145 MCG CAPS capsule Take 1  capsule (145 mcg total) by mouth daily before breakfast. Patient not taking: Reported on 10/31/2018 10/24/18   [name], [name] S, PA-C  Na Sulfate-K Sulfate-Mg Sulf 17.5-3.13-1.6 GM/177ML SOLN Suprep-Use as directed Patient taking differently: Take 1 kit by mouth as directed. Suprep-Use as directed 10/24/18   [name], [name] S, PA-C    Current Facility-Administered Medications  Medication Dose Route Frequency Provider Last Rate Last Dose  . 0.9 %  sodium chloride infusion   Intravenous Continuous Opyd, Ilene Qua, MD 110 mL/hr at 11/01/18 0100    . acetaminophen (TYLENOL) tablet 650 mg  650 mg Oral Q6H PRN Opyd, Ilene Qua, MD       Or  . acetaminophen (TYLENOL) suppository 650 mg  650 mg Rectal Q6H PRN Opyd, Ilene Qua, MD      . nitrofurantoin (macrocrystal-monohydrate) (MACROBID) capsule 100 mg  100 mg Oral Q12H Opyd, Ilene Qua, MD      . ondansetron (ZOFRAN) tablet 4 mg  4 mg Oral Q6H PRN Opyd, Ilene Qua, MD  Or  . ondansetron (ZOFRAN) injection 4 mg  4 mg Intravenous Q6H PRN Opyd, Ilene Qua, MD      . pantoprazole (PROTONIX) injection 40 mg  40 mg Intravenous Q12H Opyd, Ilene Qua, MD   40 mg at 11/01/18 0559  . venlafaxine XR (EFFEXOR-XR) 24 hr capsule 37.5 mg  37.5 mg Oral Q breakfast Opyd, Ilene Qua, MD       Current Outpatient Medications  Medication Sig Dispense Refill  . Amino Acids (AMINO ACID PO) Take 1 tablet by mouth every morning.    . ASHWAGANDHA PO Take 1 tablet by mouth every morning.     . Cholecalciferol (VITAMIN D-3) 25 MCG (1000 UT) CAPS Take 1,000 Units by mouth every morning.     . COLLAGEN PO Take 1 tablet by mouth every morning.     [name] Kitchen GLUTATHIONE PO Take 500 mg by mouth daily.    [name] Kitchen levocetirizine (XYZAL) 5 MG tablet Take 5 mg by mouth 4 (four) times a week.     . Naproxen Sodium (ALEVE) 220 MG CAPS Take 1 capsule by mouth every morning.     . nitrofurantoin, macrocrystal-monohydrate, (MACROBID) 100 MG capsule Take 100 mg by mouth every 12 (twelve) hours. 7 day  course starting on 10/28/2018    . Omega-3 Fatty Acids (FISH OIL PO) Take 1 capsule by mouth every evening. DHA and EFA-daily     . PREMARIN vaginal cream Place 1 Applicatorful vaginally at bedtime.     . tretinoin (RETIN-A) 0.1 % cream Apply 1 application topically at bedtime.    [name] Kitchen venlafaxine XR (EFFEXOR XR) 37.5 MG 24 hr capsule Take 1 capsule (37.5 mg total) by mouth daily with breakfast. 30 capsule 2  . vitamin E 400 UNIT capsule Take 400 Units by mouth daily.     [name] Kitchen linaclotide (LINZESS) 145 MCG CAPS capsule Take 1 capsule (145 mcg total) by mouth daily before breakfast. (Patient not taking: Reported on 10/31/2018) 30 capsule 11  . Na Sulfate-K Sulfate-Mg Sulf 17.5-3.13-1.6 GM/177ML SOLN Suprep-Use as directed (Patient taking differently: Take 1 kit by mouth as directed. Suprep-Use as directed) 354 mL 0    Allergies as of 10/31/2018  . (No Known Allergies)    Family History  Problem Relation Age of Onset  . Other Mother        digestive type issues  . Diabetes Mother   . Hypertension Mother   . Heart attack Father   . Heart disease Father   . Early death Brother        MVA  . Uterine cancer Maternal Grandmother   . Emphysema Maternal Grandfather   . Alcohol abuse Brother   . Colon cancer Paternal Grandfather     Social History   Socioeconomic History  . Marital status: Married    Spouse name: Not on file  . Number of children: 1  . Years of education: Not on file  . Highest education level: Not on file  Occupational History  . Occupation: Best boy: [employer]  Social Needs  . Financial resource strain: Not on file  . Food insecurity    Worry: Not on file    Inability: Not on file  . Transportation needs    Medical: Not on file    Non-medical: Not on file  Tobacco Use  . Smoking status: Former Smoker    Years: 6.00    Types: Cigarettes  . Smokeless tobacco: Never Used  Substance and Sexual Activity  . Alcohol use: Yes     Alcohol/week: 2.0 standard drinks    Types: 2 Glasses of wine per week    Comment: twice a week  . Drug use: No  . Sexual activity: Yes    Partners: Male    Birth control/protection: None, Other-see comments    Comment: husband with vasectomy  Lifestyle  . Physical activity    Days per week: Not on file    Minutes per session: Not on file  . Stress: Not on file  Relationships  . Social Herbalist on phone: Not on file    Gets together: Not on file    Attends religious service: Not on file    Active member of club or organization: Not on file    Attends meetings of clubs or organizations: Not on file    Relationship status: Not on file  . Intimate partner violence    Fear of current or ex partner: Not on file    Emotionally abused: Not on file    Physically abused: Not on file    Forced sexual activity: Not on file  Other Topics Concern  . Not on file  Social History Narrative  . Not on file    Review of Systems: Gen: Denies fever. Had chills yesterday.  CV: Denies chest pain, heart palpitations Resp: Denies shortness of breath or cough.  GI: Denies dysphagia or odynophagia. Denies vomiting blood, jaundice, and fecal incontinence.  GU : Denies urinary burning, urinary frequency, urinary incontinence. Had urinary burning and hematuria that has resolved.  MS: Joint pain in hands Derm: Denies rash Psych: Admits to depression and anxiety. Mainly anxiety.  Heme: Denies bruising, bleeding, and enlarged lymph nodes.  Physical Exam: Vital signs in last 24 hours: Temp:  [98 F (36.7 C)] 98 F (36.7 C) (10/26 1629) Pulse Rate:  [63-72] 65 (10/27 0800) Resp:  [16-20] 16 (10/27 0605) BP: (88-106)/(55-75) 106/72 (10/27 0800) SpO2:  [97 %-100 %] 98 % (10/27 0800) Weight:  [60.8 kg] 60.8 kg (10/26 1629)   General:   Alert,  Well-developed, well-nourished, pleasant and cooperative in NAD Head:  Normocephalic and atraumatic. Eyes:  Sclera clear, no icterus.    Conjunctiva pink. Ears:  Normal auditory acuity. Nose:  No deformity, discharge,  or lesions. Mouth:  No deformity or lesions, dentition normal. Lungs:  Clear throughout to auscultation.   No wheezes, crackles, or rhonchi. No acute distress. Heart:  Regular rate and rhythm; no murmurs, clicks, rubs,  or gallops. Abdomen:  Soft and nondistended.  Moderate tenderness to palpation in the epigastric area.  No masses, hepatosplenomegaly or hernias noted. Normal bowel sounds, without guarding, and without rebound.   Rectal:  Deferred  Msk:  Symmetrical without gross deformities. Normal posture. Extremities:  Without edema. Neurologic:  Alert and  oriented x4;  grossly normal neurologically. Skin:  Intact without significant lesions or rashes. Psych: Normal mood and affect.  Lab Results: Recent Labs    10/31/18 1647 11/01/18 0015  WBC 10.3  --   HGB 10.9* 9.8*  HCT 34.3* 30.1*  PLT 205  --    BMET Recent Labs    10/31/18 1647 11/01/18 0015  NA 136 138  K 4.4 4.1  CL 104 108  CO2 24 25  GLUCOSE 158* 111*  BUN 45* 33*  CREATININE 0.88 0.70  CALCIUM 8.6* 8.0*   LFT Recent Labs    10/31/18 1647  PROT 5.8*  ALBUMIN 3.5  AST 23  ALT 24  ALKPHOS 35*  BILITOT 0.8   Studies/Results: Ct Abdomen Pelvis W Contrast  Result Date: 10/31/2018 CLINICAL DATA:  Upset stomach all week and, generalized acute abdominal pain, stool was red with coffee-ground material, dizziness, nausea EXAM: CT ABDOMEN AND PELVIS WITH CONTRAST TECHNIQUE: Multidetector CT imaging of the abdomen and pelvis was performed using the standard protocol following bolus administration of intravenous contrast. Sagittal and coronal MPR images reconstructed from axial data set. CONTRAST:  123m OMNIPAQUE IOHEXOL 300 MG/ML SOLN IV. No oral contrast. COMPARISON:  09/04/2018 FINDINGS: Lower chest: Minimal dependent atelectasis in the lower lobes Hepatobiliary: Small posterior RIGHT lobe liver cyst 1.5 x 1.3 cm image 25. Tiny  nonspecific low-attenuation focus superiorly in RIGHT lobe liver too small to characterize. No additional hepatic mass lesion. Gallbladder contracted. Pancreas: Unremarkable Spleen: Normal appearance Adrenals/Urinary Tract: Adrenal glands normal appearance. Small extrarenal pelves bilaterally. Kidneys, ureters, and bladder otherwise normal appearance. Stomach/Bowel: Diffuse wall thickening of the gastric antrum question gastritis. No discrete ulcer cavity or perigastric inflammatory/infiltrative changes seen. No extraluminal gas. Appendix surgically absent by history. Bowel anastomotic staple line in the RIGHT mid abdomen consistent with prior RIGHT hemicolectomy. Remaining bowel loops normal appearance Vascular/Lymphatic: Aorta normal caliber. Retroaortic LEFT renal vein. Vascular structures patent. No adenopathy. Scattered pelvic phleboliths. Reproductive: Unremarkable uterus and adnexa Other: Tiny umbilical hernia containing fat. No free air or free fluid. Musculoskeletal: Degenerative disc disease changes L2-L3 with retrolisthesis. No acute osseous findings. IMPRESSION: Prior RIGHT hemicolectomy. Gastric antral wall thickening question gastritis. No discrete ulcer cavity or evidence of perigastric infiltration/extraluminal gas to suggest perforated ulcer, recommend correlation with endoscopy. Remainder of exam unremarkable. Electronically Signed   By: MLavonia DanaM.D.   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  Patient had normal color watery diarrhea started on Saturday night after starting antibiotic on Friday for UTI.  Thought symptoms were secondary to  antibiotics.  Monday she had acute onset of nausea without vomiting, dizziness, diaphoresis that was associated with an urgent bowel movement with black coffee ground appearance.  Some opaque red color but no bright red blood.  No other BM since yesterday.  Reports intermittent epigastric pain increasing in frequency over the last 6 months relieved by Tums.  She is not on any chronic antacid medications.  Denies GERD, heartburn, or dysphagia.  No unintentional weight loss.  Has been taking 1 Aleve every morning for years.  Also taking Excedrin about twice a week for the last month.  Drinks about 4 glasses of wine a week.  She was hypotensive initially in the emergency department with blood pressure 88/55.  BUN elevated at 45, hemoglobin 10.9 which is down from 11.7 in August.  FOBT positive.  CT abdomen and pelvis with gastric wall thickening without discrete ulcer cavity or evidence of perforation.  Hemoglobin this morning down to 9.8 without any further overt GI bleeding.  Has been n.p.o. since midnight.  Concern for NSAID induced gastritis, esophagitis, duodenitis versus peptic ulcer disease. She needs EGD for further evaluation.  Patient follows with [facility] and recently saw them on 10/24/2018 with plans to pursue colonoscopy.  Colonoscopy scheduled for 11/22/18.  Plan: Proceed with EGD with propofol today  with Dr. Oneida Alar.  Discussed with Dr. Oneida Alar who is agreeable. The risks, benefits, and alternatives have been discussed in detail with patient. They have stated understanding and desire to proceed.  Continue IV Protonix 40 mg twice daily Continue IV fluids and n.p.o. status Further recommendations to follow procedure.    LOS: 0 days    11/01/2018, 8:07 AM   Aliene Altes, [facility]

## 2018-11-01 NOTE — ED Notes (Signed)
ED TO INPATIENT HANDOFF REPORT  ED Nurse Name and Phone #: Gaspar Bidding RN  S Name/Age/Gender Catherine Short 58 y.o. female Room/Bed: APA03/APA03  Code Status   Code Status: Full Code  Home/SNF/Other Home Patient oriented to: self, place, time and situation Is this baseline? Yes   Triage Complete: Triage complete  Chief Complaint Blood in Stool  Triage Note Pt reports that her stomach has been upset all weekend. She has felt dizzy. Pt went to BR at work and stoll was red mixed with coffee ground and then had another episode in ED BR. Pt reports she is dizzy and nauseated   Allergies No Known Allergies  Level of Care/Admitting Diagnosis ED Disposition    ED Disposition Condition [facility] Area: [facility] [id number]  Level of Care: Telemetry [5]  Covid Evaluation: Asymptomatic Screening Protocol (No Symptoms)  Diagnosis: Acute GI bleeding [id number]  Admitting Physician: Vianne Bulls [id number]  Attending Physician: Vianne Bulls [id number]  PT Class (Do Not Modify): Observation [104]  PT Acc Code (Do Not Modify): Observation [10022]       B Medical/Surgery History Past Medical History:  Diagnosis Date  . Allergy   . Anxiety   . Chronic constipation   . Colitis   . Depression   . Fibroid   . H/O cold sores   . H/O removal of cyst 10/2018   right arm   . Hormone replacement therapy (HRT) 11/14/2014  . Hot flashes 11/14/2014  . Increased endometrial stripe thickness 05/30/2015   Will get biopsy  . Insomnia   . Liver cyst   . Menopause 11/14/2014  . [place] 05/23/2015  . Nerves 11/14/2014  . Night sweats 11/14/2014  . PMB (postmenopausal bleeding) 05/23/2015  . Prolactinoma ([place])   . Rectocele 05/23/2015  . Rotator cuff syndrome of left shoulder 10/30/2010   Past Surgical History:  Procedure Laterality Date  . ABDOMINAL SURGERY    . AUGMENTATION MAMMAPLASTY  2000   saline implants  . BREAST ENHANCEMENT SURGERY  1996  . CARPAL TUNNEL  RELEASE    . LAPAROSCOPIC RIGHT HEMI COLECTOMY Right 02/27/2018   Procedure: LAPAROSCOPIC RIGHT HEMI COLECTOMY;  Surgeon: [name] Signs, MD;  Location: [facility];  Service: General;  Laterality: Right;  . LAPAROTOMY N/A 02/27/2018   Procedure: EXPLORATORY LAPAROTOMY;  Surgeon: [name] Signs, MD;  Location: [facility];  Service: General;  Laterality: N/A;  . TONSILECTOMY, ADENOIDECTOMY, BILATERAL MYRINGOTOMY AND TUBES       A IV Location/Drains/Wounds Patient Lines/Drains/Airways Status   Active Line/Drains/Airways    Name:   Placement date:   Placement time:   Site:   Days:   Peripheral IV 10/31/18 Right Antecubital   10/31/18    1750    Antecubital   1          Intake/Output Last 24 hours No intake or output data in the 24 hours ending 11/01/18 0818  Labs/Imaging Results for orders placed or performed during the hospital encounter of 10/31/18 (from the past 48 hour(s))  Comprehensive metabolic panel     Status: Abnormal   Collection Time: 10/31/18  4:47 PM  Result Value Ref Range   Sodium 136 135 - 145 mmol/L   Potassium 4.4 3.5 - 5.1 mmol/L   Chloride 104 98 - 111 mmol/L   CO2 24 22 - 32 mmol/L   Glucose, Bld 158 (H) 70 - 99 mg/dL   BUN 45 (H) 6 - 20 mg/dL   Creatinine, Ser  0.88 0.44 - 1.00 mg/dL   Calcium 8.6 (L) 8.9 - 10.3 mg/dL   Total Protein 5.8 (L) 6.5 - 8.1 g/dL   Albumin 3.5 3.5 - 5.0 g/dL   AST 23 15 - 41 U/L   ALT 24 0 - 44 U/L   Alkaline Phosphatase 35 (L) 38 - 126 U/L   Total Bilirubin 0.8 0.3 - 1.2 mg/dL   GFR calc non Af Amer >60 >60 mL/min   GFR calc Af Amer >60 >60 mL/min   Anion gap 8 5 - 15    Comment: Performed at [facility], [address], [place], [place] [address]  CBC     Status: Abnormal   Collection Time: 10/31/18  4:47 PM  Result Value Ref Range   WBC 10.3 4.0 - 10.5 K/uL   RBC 3.52 (L) 3.87 - 5.11 MIL/uL   Hemoglobin 10.9 (L) 12.0 - 15.0 g/dL   HCT 34.3 (L) 36.0 - 46.0 %   MCV 97.4 80.0 - 100.0 fL   MCH 31.0 26.0 - 34.0 pg   MCHC 31.8 30.0 -  36.0 g/dL   RDW 14.1 11.5 - 15.5 %   Platelets 205 150 - 400 K/uL   nRBC 0.0 0.0 - 0.2 %    Comment: Performed at [facility], [address], [place], [place] [address]  Type and screen [facility]     Status: None   Collection Time: 10/31/18  4:47 PM  Result Value Ref Range   ABO/RH(D) O POS    Antibody Screen NEG    Sample Expiration      11/03/2018,2359 Performed at [facility], [address], [place], [place] [address]   ABO/Rh     Status: None   Collection Time: 10/31/18  4:47 PM  Result Value Ref Range   ABO/RH(D)      O POS Performed at [facility], [address] [place], [place] [address]   Urinalysis, Routine w reflex microscopic     Status: Abnormal   Collection Time: 10/31/18  6:04 PM  Result Value Ref Range   Color, Urine YELLOW YELLOW   APPearance HAZY (A) CLEAR   Specific Gravity, Urine 1.023 1.005 - 1.030   pH 5.0 5.0 - 8.0   Glucose, UA NEGATIVE NEGATIVE mg/dL   Hgb urine dipstick NEGATIVE NEGATIVE   Bilirubin Urine NEGATIVE NEGATIVE   Ketones, ur 5 (A) NEGATIVE mg/dL   Protein, ur NEGATIVE NEGATIVE mg/dL   Nitrite NEGATIVE NEGATIVE   Leukocytes,Ua NEGATIVE NEGATIVE    Comment: Performed at [facility], [address], [place], [place] [address]  POC occult blood, ED     Status: Abnormal   Collection Time: 10/31/18  6:05 PM  Result Value Ref Range   Fecal Occult Bld POSITIVE (A) NEGATIVE  SARS Coronavirus 2 by RT PCR (hospital order, performed in [facility] hospital lab) Nasopharyngeal Nasopharyngeal Swab     Status: None   Collection Time: 10/31/18  9:42 PM   Specimen: Nasopharyngeal Swab  Result Value Ref Range   SARS Coronavirus 2 NEGATIVE NEGATIVE    Comment: (NOTE) If result is NEGATIVE SARS-CoV-2 target nucleic acids are NOT DETECTED. The SARS-CoV-2 RNA is generally detectable in upper and lower  respiratory specimens during the acute phase of infection. The lowest  concentration of SARS-CoV-2 viral copies this assay can detect is  250  copies / mL. A negative result does not preclude SARS-CoV-2 infection  and should not be used as the sole basis for treatment  or other  patient management decisions.  A negative result may occur with  improper specimen collection / handling, submission of specimen other  than nasopharyngeal swab, presence of viral mutation(s) within the  areas targeted by this assay, and inadequate number of viral copies  (<250 copies / mL). A negative result must be combined with clinical  observations, patient history, and epidemiological information. If result is POSITIVE SARS-CoV-2 target nucleic acids are DETECTED. The SARS-CoV-2 RNA is generally detectable in upper and lower  respiratory specimens dur ing the acute phase of infection.  Positive  results are indicative of active infection with SARS-CoV-2.  Clinical  correlation with patient history and other diagnostic information is  necessary to determine patient infection status.  Positive results do  not rule out bacterial infection or co-infection with other viruses. If result is PRESUMPTIVE POSTIVE SARS-CoV-2 nucleic acids MAY BE PRESENT.   A presumptive positive result was obtained on the submitted specimen  and confirmed on repeat testing.  While 2019 novel coronavirus  (SARS-CoV-2) nucleic acids may be present in the submitted sample  additional confirmatory testing may be necessary for epidemiological  and / or clinical management purposes  to differentiate between  SARS-CoV-2 and other Sarbecovirus currently known to infect humans.  If clinically indicated additional testing with an alternate test  methodology [phone]) is advised. The SARS-CoV-2 RNA is generally  detectable in upper and lower respiratory sp ecimens during the acute  phase of infection. The expected result is Negative. Fact Sheet for Patients:  StrictlyIdeas.no Fact Sheet for Healthcare  Providers: BankingDealers.co.za This test is not yet approved or cleared by the [place] FDA and has been authorized for detection and/or diagnosis of SARS-CoV-2 by FDA under an Emergency Use Authorization (EUA).  This EUA will remain in effect (meaning this test can be used) for the duration of the COVID-19 declaration under Section 564(b)(1) of the Act, 21 U.S.C. section 360bbb-3(b)(1), unless the authorization is terminated or revoked sooner. Performed at [facility], [address] [place], [place] [address]   Hemoglobin and hematocrit, blood     Status: Abnormal   Collection Time: 11/01/18 12:15 AM  Result Value Ref Range   Hemoglobin 9.8 (L) 12.0 - 15.0 g/dL   HCT 30.1 (L) 36.0 - 46.0 %    Comment: Performed at [facility], [address], [place], [place] [id number]  Basic metabolic panel     Status: Abnormal   Collection Time: 11/01/18 12:15 AM  Result Value Ref Range   Sodium 138 135 - 145 mmol/L   Potassium 4.1 3.5 - 5.1 mmol/L   Chloride 108 98 - 111 mmol/L   CO2 25 22 - 32 mmol/L   Glucose, Bld 111 (H) 70 - 99 mg/dL   BUN 33 (H) 6 - 20 mg/dL   Creatinine, Ser 0.70 0.44 - 1.00 mg/dL   Calcium 8.0 (L) 8.9 - 10.3 mg/dL   GFR calc non Af Amer >60 >60 mL/min   GFR calc Af Amer >60 >60 mL/min   Anion gap 5 5 - 15    Comment: Performed at [facility], [address], [place], [place] [address]   Ct Abdomen Pelvis W Contrast  Result Date: 10/31/2018 CLINICAL DATA:  Upset stomach all week and, generalized acute abdominal pain, stool was red with coffee-ground material, dizziness, nausea EXAM: CT ABDOMEN AND PELVIS WITH CONTRAST TECHNIQUE: Multidetector CT imaging of the abdomen and pelvis was performed using the standard protocol following bolus administration of intravenous contrast. Sagittal and  coronal MPR images reconstructed from axial data set. CONTRAST:  158mL OMNIPAQUE IOHEXOL 300 MG/ML SOLN IV. No oral contrast. COMPARISON:  09/04/2018  FINDINGS: Lower chest: Minimal dependent atelectasis in the lower lobes Hepatobiliary: Small posterior RIGHT lobe liver cyst 1.5 x 1.3 cm image 25. Tiny nonspecific low-attenuation focus superiorly in RIGHT lobe liver too small to characterize. No additional hepatic mass lesion. Gallbladder contracted. Pancreas: Unremarkable Spleen: Normal appearance Adrenals/Urinary Tract: Adrenal glands normal appearance. Small extrarenal pelves bilaterally. Kidneys, ureters, and bladder otherwise normal appearance. Stomach/Bowel: Diffuse wall thickening of the gastric antrum question gastritis. No discrete ulcer cavity or perigastric inflammatory/infiltrative changes seen. No extraluminal gas. Appendix surgically absent by history. Bowel anastomotic staple line in the RIGHT mid abdomen consistent with prior RIGHT hemicolectomy. Remaining bowel loops normal appearance Vascular/Lymphatic: Aorta normal caliber. Retroaortic LEFT renal vein. Vascular structures patent. No adenopathy. Scattered pelvic phleboliths. Reproductive: Unremarkable uterus and adnexa Other: Tiny umbilical hernia containing fat. No free air or free fluid. Musculoskeletal: Degenerative disc disease changes L2-L3 with retrolisthesis. No acute osseous findings. IMPRESSION: Prior RIGHT hemicolectomy. Gastric antral wall thickening question gastritis. No discrete ulcer cavity or evidence of perigastric infiltration/extraluminal gas to suggest perforated ulcer, recommend correlation with endoscopy. Remainder of exam unremarkable. Electronically Signed   By: Lavonia Dana M.D.   On: 10/31/2018 18:57    Pending Labs Unresulted Labs (From admission, onward)    Start     Ordered   11/02/18 0500  CBC  Tomorrow morning,   R    Question:  Specimen collection method  Answer:  Lab=Lab collect   11/01/18 0750   10/31/18 2221  Culture, Urine  Once,   STAT     10/31/18 2220   10/31/18 2139  C difficile quick scan w PCR reflex  (C Difficile quick screen w PCR reflex  panel)  Once, for 24 hours,   STAT     10/31/18 2138          Vitals/Pain Today's Vitals   10/31/18 2100 11/01/18 0605 11/01/18 0724 11/01/18 0800  BP: 104/67 106/75  106/72  Pulse: 72 63  65  Resp: 18 16    Temp:      TempSrc:      SpO2: 97% 99%  98%  Weight:      Height:      PainSc:   5      Isolation Precautions No active isolations  Medications Medications  pantoprazole (PROTONIX) injection 40 mg (40 mg Intravenous Given 11/01/18 0559)  venlafaxine XR (EFFEXOR-XR) 24 hr capsule 37.5 mg (has no administration in time range)  0.9 %  sodium chloride infusion ( Intravenous New Bag/Given 11/01/18 0100)  acetaminophen (TYLENOL) tablet 650 mg (has no administration in time range)    Or  acetaminophen (TYLENOL) suppository 650 mg (has no administration in time range)  ondansetron (ZOFRAN) tablet 4 mg (has no administration in time range)    Or  ondansetron (ZOFRAN) injection 4 mg (has no administration in time range)  nitrofurantoin (macrocrystal-monohydrate) (MACROBID) capsule 100 mg (has no administration in time range)  sodium chloride 0.9 % bolus 1,000 mL (0 mLs Intravenous Stopped 10/31/18 2355)  pantoprazole (PROTONIX) injection 40 mg (40 mg Intravenous Given 10/31/18 1750)  ondansetron (ZOFRAN) injection 4 mg (4 mg Intravenous Given 10/31/18 1812)  iohexol (OMNIPAQUE) 300 MG/ML solution 100 mL (100 mLs Intravenous Contrast Given 10/31/18 1832)    Mobility walks Low fall risk   Focused Assessments Gastrointestinal   R Recommendations: See Admitting Provider Note  Report given to:   Additional Notes:  COVID Negative; NPO Waiting on GI for possible EGD today.

## 2018-11-02 ENCOUNTER — Telehealth: Payer: Self-pay | Admitting: Gastroenterology

## 2018-11-02 LAB — URINE CULTURE: Culture: NO GROWTH

## 2018-11-02 NOTE — Telephone Encounter (Signed)
I reviewed the records from Dr. Oneida Alar and her EGD yesterday. May proceed with follow-up EGD at the time of colonoscopy.  Thank you.

## 2018-11-02 NOTE — Telephone Encounter (Signed)
Spoke with patient and rescheduled her appointment until 12-19-2018, added on EGD per Dr Tarri Glenn. Patient verbalized understanding. Will send new instructions via mychart.

## 2018-11-02 NOTE — Telephone Encounter (Signed)
Pt had procedure yesterday with [facility] and said her insurance was denying the protonix prescription. Please advise. She uses [facility] in [place]

## 2018-11-02 NOTE — Telephone Encounter (Signed)
Dr Tarri Glenn, please advise this patient was seen in the hospital. Does she need follow up visit first?

## 2018-11-03 ENCOUNTER — Telehealth: Payer: Self-pay | Admitting: Gastroenterology

## 2018-11-03 ENCOUNTER — Telehealth: Payer: Self-pay | Admitting: Family Medicine

## 2018-11-03 LAB — SURGICAL PATHOLOGY

## 2018-11-03 NOTE — Telephone Encounter (Signed)
See previous note. Per Dr. Oneida Alar, pt is [name] pt and she can have the PA sent to them and pt is aware.

## 2018-11-03 NOTE — Telephone Encounter (Signed)
Fyi, pt called to inform that her pharmacy will be faxing PA form for protonix.

## 2018-11-03 NOTE — Telephone Encounter (Signed)
Sure

## 2018-11-03 NOTE — Telephone Encounter (Signed)
PT will have pharmacy send me info to work on this.

## 2018-11-03 NOTE — Telephone Encounter (Signed)
Patient states that the ER GI doctor prescribed her Protonix and will not do the PA.  Her [facility] will also not do her PA.  States they want her PCP to do it.  Please advise if you will do PA for Protonix.

## 2018-11-03 NOTE — Telephone Encounter (Signed)
PATIENT CALLED WANTING TO KNOW IF [facility] FAXED OVER THE PAPERWORK NEEDED TO GET THE PRIOR AUTH FOR HER PRESCRIPTION

## 2018-11-04 ENCOUNTER — Encounter: Payer: Self-pay | Admitting: Family Medicine

## 2018-11-04 NOTE — Telephone Encounter (Signed)
Prior auth approved for Linzess and Protonix BID. Patient and pharamcy made aware.

## 2018-11-04 NOTE — Telephone Encounter (Signed)
Spoke with patient to inform her that we did receive the prior authorization for the pantoprazole. And I have started it on [email or web address]. Patient states she also needs a prior auth for Linzess but we did not receive that from the pharmacy yet. Will contact pharmacy for more info.

## 2018-11-17 ENCOUNTER — Other Ambulatory Visit: Payer: Self-pay

## 2018-11-18 ENCOUNTER — Ambulatory Visit (INDEPENDENT_AMBULATORY_CARE_PROVIDER_SITE_OTHER): Payer: BC Managed Care – PPO | Admitting: Family Medicine

## 2018-11-18 ENCOUNTER — Encounter: Payer: Self-pay | Admitting: Family Medicine

## 2018-11-18 VITALS — BP 114/72 | HR 72 | Temp 97.9°F | Ht 63.0 in | Wt 133.0 lb

## 2018-11-18 DIAGNOSIS — G43119 Migraine with aura, intractable, without status migrainosus: Secondary | ICD-10-CM | POA: Diagnosis not present

## 2018-11-18 DIAGNOSIS — Z13220 Encounter for screening for lipoid disorders: Secondary | ICD-10-CM

## 2018-11-18 DIAGNOSIS — N951 Menopausal and female climacteric states: Secondary | ICD-10-CM

## 2018-11-18 DIAGNOSIS — K922 Gastrointestinal hemorrhage, unspecified: Secondary | ICD-10-CM

## 2018-11-18 DIAGNOSIS — F419 Anxiety disorder, unspecified: Secondary | ICD-10-CM

## 2018-11-18 DIAGNOSIS — D352 Benign neoplasm of pituitary gland: Secondary | ICD-10-CM | POA: Diagnosis not present

## 2018-11-18 MED ORDER — SUMATRIPTAN SUCCINATE 25 MG PO TABS: 25.0000 mg | ORAL_TABLET | ORAL | 0 refills | Status: DC | PRN

## 2018-11-18 MED ORDER — VENLAFAXINE HCL ER 75 MG PO CP24: 75.0000 mg | ORAL_CAPSULE | Freq: Every day | ORAL | 1 refills | Status: DC

## 2018-11-18 NOTE — Patient Instructions (Signed)

## 2018-11-18 NOTE — Progress Notes (Signed)
Assessment & Plan:  1. Anxiety - Improving. Venlafaxine increased from 37.5 mg to 75 mg QD.  - venlafaxine XR (EFFEXOR-XR) 75 MG 24 hr capsule; Take 1 capsule (75 mg total) by mouth daily with breakfast.  Dispense: 90 capsule; Refill: 1 - BMP8+EGFR; Future  2. Vasomotor symptoms due to menopause - Improving. Venlafaxine increased from 37.5 mg to 75 mg QD.  - venlafaxine XR (EFFEXOR-XR) 75 MG 24 hr capsule; Take 1 capsule (75 mg total) by mouth daily with breakfast.  Dispense: 90 capsule; Refill: 1  3. Intractable migraine with aura without status migrainosus - Patient has taken Imitrex in the past for migraines which was effective. She is requesting this again since she can no longer take Excedrin due to the ASA and her bleeding ulcers.  - SUMAtriptan (IMITREX) 25 MG tablet; Take 1 tablet (25 mg total) by mouth every 2 (two) hours as needed for migraine. May repeat in 2 hours if headache persists or recurs.  Dispense: 10 tablet; Refill: 0  4. Prolactinoma ([place]) - Labs per patient's request.  - Prolactin; Future  5. Acute GI bleeding - Patient doing well. Taking Protonix as scheduled. Encouraged to keep all f/u appointments with GI.  - CBC with Differential/Platelet; Future  6. Screening for lipid disorders - Lipid Panel; Future   Return in about 6 weeks (around 12/30/2018) for anxiety.  Hendricks Limes, MSN, APRN, FNP-C [facility]  Subjective:    Patient ID: Catherine Short, female    DOB: 02-13-1960, 58 y.o.   MRN: [id number]  Patient Care Team: Loman Brooklyn, FNP as PCP - General (Family Medicine)   Chief Complaint:  Chief Complaint  Patient presents with   Anxiety    HPI: Catherine Short is a 58 y.o. female presenting on 11/18/2018 for Anxiety  Patient was started on venlafaxine 37.5 mg QD six weeks ago after failing therapy previously with Prozac, Zoloft, and Lexapro. She tried but was not completed happy previously with Wellbutrin and  Buspirone. She believes the venlafaxine is helping some with her anxiety but not enough. It has decreased her hot flashes by ~50%.   GAD 7 : Generalized Anxiety Score 11/18/2018 10/07/2018  Nervous, Anxious, on Edge 1 3  Control/stop worrying 2 2  Worry too much - different things 1 3  Trouble relaxing 2 3  Restless 2 3  Easily annoyed or irritable 2 3  Afraid - awful might happen 1 2  Total GAD 7 Score 11 19  Anxiety Difficulty Somewhat difficult Somewhat difficult    New complaints: Patient is requesting to have her prolactin level checked due to a history of a prolactinoma. She had asked her OBGYN when she went for her physical but it was not drawn. She would also like to have routine lab work completed as this was not done either at her recent appointment. She was in the hospital for bleeding ulcers recently and would like to make sure her hgb is coming back up.   Patient reports a stye of her right eye that has been present for one week. She has been using OTC stye drops. She is still wearing make-up.  She also reports feeling congestion and pressure in her face. She feels like this is something she got from work. This has also been causing more headaches recently which are not relieved with Tylenol. She is unable to take any NSAIDs due to her bleeding ulcers.    Social history:  Relevant past medical, surgical,  family and social history reviewed and updated as indicated. Interim medical history since our last visit reviewed.  Allergies and medications reviewed and updated.  DATA REVIEWED: CHART IN EPIC  ROS: Negative unless specifically indicated above in HPI.    Current Outpatient Medications:    acetaminophen (TYLENOL) 325 MG tablet, Take 2 tablets (650 mg total) by mouth every 6 (six) hours as needed for mild pain (or Fever >/= 101)., Disp: 12 tablet, Rfl: 0   Amino Acids (AMINO ACID PO), Take 1 tablet by mouth every morning., Disp: , Rfl:    ASHWAGANDHA PO, Take 1  tablet by mouth every morning. , Disp: , Rfl:    Cholecalciferol (VITAMIN D-3) 25 MCG (1000 UT) CAPS, Take 1,000 Units by mouth every morning. , Disp: , Rfl:    COLLAGEN PO, Take 1 tablet by mouth every morning. , Disp: , Rfl:    GLUTATHIONE PO, Take 500 mg by mouth daily., Disp: , Rfl:    levocetirizine (XYZAL) 5 MG tablet, Take 5 mg by mouth 4 (four) times a week. , Disp: , Rfl:    linaclotide (LINZESS) 145 MCG CAPS capsule, Take 1 capsule (145 mcg total) by mouth daily before breakfast., Disp: 30 capsule, Rfl: 11   Omega-3 Fatty Acids (FISH OIL PO), Take 1 capsule by mouth every evening. DHA and EFA-daily , Disp: , Rfl:    ondansetron (ZOFRAN) 4 MG tablet, Take 1 tablet (4 mg total) by mouth every 6 (six) hours as needed for nausea or vomiting., Disp: 20 tablet, Rfl: 0   pantoprazole (PROTONIX) 40 MG tablet, Take 1 tablet (40 mg total) by mouth 2 (two) times daily before a meal., Disp: 30 tablet, Rfl: 4   PREMARIN vaginal cream, Place 1 Applicatorful vaginally at bedtime. , Disp: , Rfl:    tretinoin (RETIN-A) 0.1 % cream, Apply 1 application topically at bedtime., Disp: , Rfl:    venlafaxine XR (EFFEXOR-XR) 75 MG 24 hr capsule, Take 1 capsule (75 mg total) by mouth daily with breakfast., Disp: 90 capsule, Rfl: 1   vitamin E 400 UNIT capsule, Take 400 Units by mouth daily. , Disp: , Rfl:    dicyclomine (BENTYL) 20 MG tablet, Take 20 mg by mouth 3 (three) times daily., Disp: , Rfl:    Na Sulfate-K Sulfate-Mg Sulf 17.5-3.13-1.6 GM/177ML SOLN, Suprep-Use as directed (Patient taking differently: Take 1 kit by mouth as directed. Suprep-Use as directed), Disp: 354 mL, Rfl: 0   SUMAtriptan (IMITREX) 25 MG tablet, Take 1 tablet (25 mg total) by mouth every 2 (two) hours as needed for migraine. May repeat in 2 hours if headache persists or recurs., Disp: 10 tablet, Rfl: 0   Allergies  Allergen Reactions   Nsaids Other (See Comments)    Avoid use due to gastric ulcers.    Past Medical  History:  Diagnosis Date   Allergy    Anxiety    Chronic constipation    Colitis    Depression    Fibroid    H/O cold sores    H/O removal of cyst 10/2018   right arm    Headache    Hormone replacement therapy (HRT) 11/14/2014   Hot flashes 11/14/2014   Increased endometrial stripe thickness 05/30/2015   Will get biopsy   Insomnia    Liver cyst    Menopause 11/14/2014   [place] 05/23/2015   Nerves 11/14/2014   Night sweats 11/14/2014   PMB (postmenopausal bleeding) 05/23/2015   Prolactinoma ([place])    Rectocele 05/23/2015  Rotator cuff syndrome of left shoulder 10/30/2010   Upper GI bleed 11/01/2018   due to ulcers    Past Surgical History:  Procedure Laterality Date   ABDOMINAL SURGERY     APPENDECTOMY     AUGMENTATION MAMMAPLASTY  2000   saline implants   BIOPSY  11/01/2018   Procedure: BIOPSY;  Surgeon: Danie Binder, MD;  Location: [facility];  Service: Endoscopy;;   BREAST ENHANCEMENT SURGERY  1996   CARPAL TUNNEL RELEASE     ESOPHAGOGASTRODUODENOSCOPY (EGD) WITH PROPOFOL N/A 11/01/2018   Procedure: ESOPHAGOGASTRODUODENOSCOPY (EGD) WITH PROPOFOL;  Surgeon: Danie Binder, MD;  Location: [facility];  Service: Endoscopy;  Laterality: N/A;   LAPAROSCOPIC RIGHT HEMI COLECTOMY Right 02/27/2018   Procedure: LAPAROSCOPIC RIGHT HEMI COLECTOMY;  Surgeon: [name] Signs, MD;  Location: [facility];  Service: General;  Laterality: Right;   LAPAROTOMY N/A 02/27/2018   Procedure: EXPLORATORY LAPAROTOMY;  Surgeon: [name] Signs, MD;  Location: [facility];  Service: General;  Laterality: N/A;   TONSILECTOMY, ADENOIDECTOMY, BILATERAL MYRINGOTOMY AND TUBES      Social History   Socioeconomic History   Marital status: Married    Spouse name: Not on file   Number of children: 1   Years of education: Not on file   Highest education level: Not on file  Occupational History   Occupation: Best boy: [employer]    Comment: Administrator strain: Not on file   Food insecurity    Worry: Not on file    Inability: Not on file   Transportation needs    Medical: Not on file    Non-medical: Not on file  Tobacco Use   Smoking status: Former Smoker    Years: 6.00    Types: Cigarettes   Smokeless tobacco: Never Used  Substance and Sexual Activity   Alcohol use: Yes    Alcohol/week: 2.0 standard drinks    Types: 2 Glasses of wine per week    Comment: twice a week   Drug use: No   Sexual activity: Yes    Partners: Male    Birth control/protection: None, Other-see comments    Comment: husband with vasectomy  Lifestyle   Physical activity    Days per week: Not on file    Minutes per session: Not on file   Stress: Not on file  Relationships   Social connections    Talks on phone: Not on file    Gets together: Not on file    Attends religious service: Not on file    Active member of club or organization: Not on file    Attends meetings of clubs or organizations: Not on file    Relationship status: Not on file   Intimate partner violence    Fear of current or ex partner: Not on file    Emotionally abused: Not on file    Physically abused: Not on file    Forced sexual activity: Not on file  Other Topics Concern   Not on file  Social History Narrative   Not on file        Objective:    BP 114/72    Pulse 72    Temp 97.9 F (36.6 C) (Temporal)    Ht '5\' 3"'  (1.6 m)    Wt 133 lb (60.3 kg)    LMP 07/05/2016 (Approximate) Comment: neg upt   SpO2 98%    BMI 23.56 kg/m  Physical Exam Vitals signs reviewed.  Constitutional:      General: She is not in acute distress.    Appearance: Normal appearance. She is normal weight. She is not ill-appearing, toxic-appearing or diaphoretic.  HENT:     Head: Normocephalic and atraumatic.  Eyes:     General: No scleral icterus.       Right eye: No discharge.        Left eye: No discharge.     Conjunctiva/sclera: Conjunctivae normal.    Neck:     Musculoskeletal: Normal range of motion.  Cardiovascular:     Rate and Rhythm: Normal rate and regular rhythm.     Heart sounds: Normal heart sounds. No murmur. No friction rub. No gallop.   Pulmonary:     Effort: Pulmonary effort is normal. No respiratory distress.     Breath sounds: Normal breath sounds. No stridor. No wheezing, rhonchi or rales.  Musculoskeletal: Normal range of motion.  Skin:    General: Skin is warm and dry.     Capillary Refill: Capillary refill takes less than 2 seconds.  Neurological:     General: No focal deficit present.     Mental Status: She is alert and oriented to person, place, and time. Mental status is at baseline.  Psychiatric:        Mood and Affect: Mood normal.        Behavior: Behavior normal.        Thought Content: Thought content normal.        Judgment: Judgment normal.     Lab Results  Component Value Date   TSH 1.660 08/19/2016   Lab Results  Component Value Date   WBC 10.3 10/31/2018   HGB 9.8 (L) 11/01/2018   HCT 30.1 (L) 11/01/2018   MCV 97.4 10/31/2018   PLT 205 10/31/2018   Lab Results  Component Value Date   NA 138 11/01/2018   K 4.1 11/01/2018   CO2 25 11/01/2018   GLUCOSE 111 (H) 11/01/2018   BUN 33 (H) 11/01/2018   CREATININE 0.70 11/01/2018   BILITOT 0.8 10/31/2018   ALKPHOS 35 (L) 10/31/2018   AST 23 10/31/2018   ALT 24 10/31/2018   PROT 5.8 (L) 10/31/2018   ALBUMIN 3.5 10/31/2018   CALCIUM 8.0 (L) 11/01/2018   ANIONGAP 5 11/01/2018   Lab Results  Component Value Date   CHOL 206 (H) 08/19/2016   Lab Results  Component Value Date   HDL 93 08/19/2016   Lab Results  Component Value Date   LDLCALC 94 08/19/2016   Lab Results  Component Value Date   TRIG 95 08/19/2016   Lab Results  Component Value Date   CHOLHDL 2.2 08/19/2016   Lab Results  Component Value Date   HGBA1C 5.4 05/23/2015

## 2018-11-21 ENCOUNTER — Other Ambulatory Visit: Payer: Self-pay

## 2018-11-21 ENCOUNTER — Telehealth: Payer: Self-pay | Admitting: *Deleted

## 2018-11-21 ENCOUNTER — Encounter: Payer: Self-pay | Admitting: Family Medicine

## 2018-11-21 ENCOUNTER — Other Ambulatory Visit: Payer: BC Managed Care – PPO

## 2018-11-21 DIAGNOSIS — F419 Anxiety disorder, unspecified: Secondary | ICD-10-CM | POA: Diagnosis not present

## 2018-11-21 DIAGNOSIS — J019 Acute sinusitis, unspecified: Secondary | ICD-10-CM

## 2018-11-21 DIAGNOSIS — K922 Gastrointestinal hemorrhage, unspecified: Secondary | ICD-10-CM | POA: Diagnosis not present

## 2018-11-21 DIAGNOSIS — D352 Benign neoplasm of pituitary gland: Secondary | ICD-10-CM

## 2018-11-21 DIAGNOSIS — Z13220 Encounter for screening for lipoid disorders: Secondary | ICD-10-CM

## 2018-11-21 NOTE — Telephone Encounter (Signed)
Patient feels like she has a sinus infection and states she spoke with you about it at appointment. Patient would like antibiotic sent to pharmacy. Patient aware you are out of office today

## 2018-11-22 ENCOUNTER — Encounter: Payer: BC Managed Care – PPO | Admitting: Gastroenterology

## 2018-11-22 LAB — CBC WITH DIFFERENTIAL/PLATELET
Basophils Absolute: 0 10*3/uL (ref 0.0–0.2)
Basos: 1 %
EOS (ABSOLUTE): 0 10*3/uL (ref 0.0–0.4)
Eos: 1 %
Hematocrit: 31.9 % — ABNORMAL LOW (ref 34.0–46.6)
Hemoglobin: 10.5 g/dL — ABNORMAL LOW (ref 11.1–15.9)
Immature Grans (Abs): 0 10*3/uL (ref 0.0–0.1)
Immature Granulocytes: 0 %
Lymphocytes Absolute: 1 10*3/uL (ref 0.7–3.1)
Lymphs: 32 %
MCH: 29.5 pg (ref 26.6–33.0)
MCHC: 32.9 g/dL (ref 31.5–35.7)
MCV: 90 fL (ref 79–97)
Monocytes Absolute: 0.4 10*3/uL (ref 0.1–0.9)
Monocytes: 13 %
Neutrophils Absolute: 1.6 10*3/uL (ref 1.4–7.0)
Neutrophils: 53 %
Platelets: 219 10*3/uL (ref 150–450)
RBC: 3.56 x10E6/uL — ABNORMAL LOW (ref 3.77–5.28)
RDW: 13.5 % (ref 11.7–15.4)
WBC: 3.1 10*3/uL — ABNORMAL LOW (ref 3.4–10.8)

## 2018-11-22 LAB — LIPID PANEL
Chol/HDL Ratio: 2 ratio (ref 0.0–4.4)
Cholesterol, Total: 163 mg/dL (ref 100–199)
HDL: 81 mg/dL (ref >39)
LDL Chol Calc (NIH): 69 mg/dL (ref 0–99)
Triglycerides: 69 mg/dL (ref 0–149)
VLDL Cholesterol Cal: 13 mg/dL (ref 5–40)

## 2018-11-22 LAB — BMP8+EGFR
BUN/Creatinine Ratio: 19 (ref 9–23)
BUN: 16 mg/dL (ref 6–24)
CO2: 26 mmol/L (ref 20–29)
Calcium: 9 mg/dL (ref 8.7–10.2)
Chloride: 104 mmol/L (ref 96–106)
Creatinine, Ser: 0.83 mg/dL (ref 0.57–1.00)
GFR calc Af Amer: 91 mL/min/{1.73_m2} (ref >59)
GFR calc non Af Amer: 79 mL/min/{1.73_m2} (ref >59)
Glucose: 91 mg/dL (ref 65–99)
Potassium: 4.4 mmol/L (ref 3.5–5.2)
Sodium: 143 mmol/L (ref 134–144)

## 2018-11-22 LAB — PROLACTIN: Prolactin: 14.6 ng/mL (ref 4.8–23.3)

## 2018-11-22 MED ORDER — AMOXICILLIN-POT CLAVULANATE 875-125 MG PO TABS: 1.0000 | ORAL_TABLET | Freq: Two times a day (BID) | ORAL | 0 refills | Status: AC

## 2018-11-22 NOTE — Telephone Encounter (Signed)
Patient aware, script is ready. 

## 2018-11-25 ENCOUNTER — Telehealth: Payer: Self-pay | Admitting: Family Medicine

## 2018-11-25 ENCOUNTER — Other Ambulatory Visit: Payer: Self-pay

## 2018-11-25 DIAGNOSIS — Z20822 Contact with and (suspected) exposure to covid-19: Secondary | ICD-10-CM

## 2018-11-25 MED ORDER — FLUCONAZOLE 150 MG PO TABS: 150.0000 mg | ORAL_TABLET | Freq: Once | ORAL | 0 refills | Status: AC

## 2018-11-25 NOTE — Telephone Encounter (Signed)
Sorry. Skipped that part about the yeast. Yes, no problem.

## 2018-11-25 NOTE — Telephone Encounter (Signed)
Spoke with pt - will go to be tested today ( [place])  She would like to know if you can send in Diflucan #2 or #3 for the yeast infection mentioned in previous note. ( she states that #1 never fully clears it up for her)  [facility] pharm

## 2018-11-25 NOTE — Telephone Encounter (Signed)
Patient says she was prescribed an antibiotic on 11/22/18 but says antibiotic is not working and has caused her to develop a yeast infection and chest congestion. Patient wants to know if there is something else that can be prescribed to her.

## 2018-11-25 NOTE — Telephone Encounter (Signed)
If antibiotics are not working she should consider being tested for COVID-19.

## 2018-11-25 NOTE — Telephone Encounter (Signed)
Aware by VM  med sent

## 2018-11-28 ENCOUNTER — Telehealth: Payer: Self-pay | Admitting: Family Medicine

## 2018-11-28 LAB — NOVEL CORONAVIRUS, NAA: SARS-CoV-2, NAA: DETECTED — AB

## 2018-11-28 NOTE — Telephone Encounter (Signed)
Please sign on results.

## 2018-11-28 NOTE — Telephone Encounter (Signed)
Catherine Short, I do not have access to Brittany's box.  I assume she wants COVID test from 3 days ago which was POSITIVE.  Please inform patient and reiterate CDC guidelines for isolation.  If she develops severe symptoms, she is to go to ED.

## 2018-11-28 NOTE — Telephone Encounter (Signed)
Aware of results. 

## 2018-11-29 ENCOUNTER — Telehealth: Payer: Self-pay | Admitting: Family Medicine

## 2018-11-29 DIAGNOSIS — G43119 Migraine with aura, intractable, without status migrainosus: Secondary | ICD-10-CM

## 2018-11-29 MED ORDER — SUMATRIPTAN SUCCINATE 25 MG PO TABS: 25.0000 mg | ORAL_TABLET | ORAL | 1 refills | Status: DC | PRN

## 2018-11-29 NOTE — Telephone Encounter (Signed)
Mail order would not fill Imitrex because it was a 30 day supply. Resent to mail order pharmacy per patients request with correct quantity. Patient notified

## 2018-11-30 ENCOUNTER — Other Ambulatory Visit: Payer: Self-pay

## 2018-11-30 MED ORDER — LINACLOTIDE 145 MCG PO CAPS: 145.0000 ug | ORAL_CAPSULE | Freq: Every day | ORAL | 0 refills | Status: DC

## 2018-11-30 NOTE — Telephone Encounter (Signed)
Patient has upcoming appointment , linzess sent to [facility] as requested.

## 2018-12-06 HISTORY — PX: COLONOSCOPY: SHX174

## 2018-12-15 ENCOUNTER — Ambulatory Visit (INDEPENDENT_AMBULATORY_CARE_PROVIDER_SITE_OTHER): Payer: BC Managed Care – PPO

## 2018-12-15 ENCOUNTER — Other Ambulatory Visit: Payer: Self-pay | Admitting: Gastroenterology

## 2018-12-15 ENCOUNTER — Encounter: Payer: Self-pay | Admitting: Gastroenterology

## 2018-12-15 DIAGNOSIS — Z1159 Encounter for screening for other viral diseases: Secondary | ICD-10-CM | POA: Diagnosis not present

## 2018-12-16 LAB — SARS CORONAVIRUS 2 (TAT 6-24 HRS): SARS Coronavirus 2: NEGATIVE

## 2018-12-19 ENCOUNTER — Ambulatory Visit (AMBULATORY_SURGERY_CENTER): Payer: BC Managed Care – PPO | Admitting: Gastroenterology

## 2018-12-19 ENCOUNTER — Other Ambulatory Visit: Payer: Self-pay

## 2018-12-19 ENCOUNTER — Encounter: Payer: Self-pay | Admitting: Gastroenterology

## 2018-12-19 VITALS — BP 86/54 | HR 55 | Temp 98.3°F | Resp 9 | Ht 63.0 in | Wt 134.0 lb

## 2018-12-19 DIAGNOSIS — K922 Gastrointestinal hemorrhage, unspecified: Secondary | ICD-10-CM

## 2018-12-19 DIAGNOSIS — Z1211 Encounter for screening for malignant neoplasm of colon: Secondary | ICD-10-CM | POA: Diagnosis not present

## 2018-12-19 DIAGNOSIS — K259 Gastric ulcer, unspecified as acute or chronic, without hemorrhage or perforation: Secondary | ICD-10-CM

## 2018-12-19 DIAGNOSIS — K3189 Other diseases of stomach and duodenum: Secondary | ICD-10-CM | POA: Diagnosis not present

## 2018-12-19 DIAGNOSIS — K2951 Unspecified chronic gastritis with bleeding: Secondary | ICD-10-CM | POA: Diagnosis not present

## 2018-12-19 DIAGNOSIS — K2950 Unspecified chronic gastritis without bleeding: Secondary | ICD-10-CM | POA: Diagnosis not present

## 2018-12-19 DIAGNOSIS — K254 Chronic or unspecified gastric ulcer with hemorrhage: Secondary | ICD-10-CM | POA: Diagnosis not present

## 2018-12-19 MED ORDER — SODIUM CHLORIDE 0.9 % IV SOLN: 500.0000 mL | Freq: Once | INTRAVENOUS | Status: DC

## 2018-12-19 MED ORDER — PANTOPRAZOLE SODIUM 40 MG PO TBEC: 40.0000 mg | DELAYED_RELEASE_TABLET | Freq: Two times a day (BID) | ORAL | 0 refills | Status: DC

## 2018-12-19 NOTE — Op Note (Signed)
[facility] Patient Name: Alieza Stepanski Procedure Date: 12/19/2018 10:24 AM MRN: [id number] Endoscopist: Thornton Park MD, MD Age: 58 Referring MD:  Date of Birth: 1960-06-16 Gender: Female Account #: [id number] Procedure:                Upper GI endoscopy Indications:              Follow-up of acute gastric ulcer with hemorrhage                           Three gastric ulcers with associated bleeding on                            EGD with Dr. Oneida Alar 11/01/18 Medicines:                Monitored Anesthesia Care Procedure:                Pre-Anesthesia Assessment:                           - Prior to the procedure, a History and Physical                            was performed, and patient medications and                            allergies were reviewed. The patient's tolerance of                            previous anesthesia was also reviewed. The risks                            and benefits of the procedure and the sedation                            options and risks were discussed with the patient.                            All questions were answered, and informed consent                            was obtained. Prior Anticoagulants: The patient has                            taken no previous anticoagulant or antiplatelet                            agents. ASA Grade Assessment: II - A patient with                            mild systemic disease. After reviewing the risks                            and benefits, the patient was deemed in  satisfactory condition to undergo the procedure.                           After obtaining informed consent, the endoscope was                            passed under direct vision. Throughout the                            procedure, the patient's blood pressure, pulse, and                            oxygen saturations were monitored continuously. The                            Endoscope was  introduced through the mouth, and                            advanced to the third part of duodenum. The upper                            GI endoscopy was accomplished without difficulty.                            The patient tolerated the procedure well. Scope In: Scope Out: Findings:                 The examined esophagus was normal.                           There is some mucosal deformity in the prepyloric                            region consistent with healing gastric ulcers. Two                            non-bleeding cratered gastric ulcers with no                            stigmata of bleeding were found in the prepyloric                            region of the stomach. The margins are slightly                            irregular, although not heaped. Biopsies were taken                            from the ulcer margins with a cold forceps for                            histology. Estimated blood loss was minimal.  Diffuse mild inflammation characterized by                            erythema, friability and granularity was found in                            the gastric antrum. Biopsies were taken from the                            antrum, body, and fundus with a cold forceps for                            histology. Estimated blood loss was minimal.                           The examined duodenum was normal.                           The cardia and gastric fundus were normal on                            retroflexion.                           The exam was otherwise without abnormality. Complications:            No immediate complications. Estimated blood loss:                            Minimal. Estimated Blood Loss:     Estimated blood loss was minimal. Impression:               - Normal esophagus.                           - Non-bleeding gastric ulcers with no stigmata of                            bleeding. Biopsied.                            - Gastritis. Biopsied.                           - Normal examined duodenum.                           - The examination was otherwise normal. Recommendation:           - Patient has a contact number available for                            emergencies. The signs and symptoms of potential                            delayed complications were discussed with the  patient. Return to normal activities tomorrow.                            Written discharge instructions were provided to the                            patient.                           - Resume previous diet.                           - Continue present medications including                            pantoprazole 40 mg BID for an additional 12 weeks.                           - No aspirin, ibuprofen, naproxen, or other                            non-steroidal anti-inflammatory drugs.                           - Await pathology results.                           - Repeat upper endoscopy in 3 months to evaluate                            the response to therapy. Thornton Park MD, MD 12/19/2018 11:07:16 AM This report has been signed electronically.

## 2018-12-19 NOTE — Patient Instructions (Signed)
YOU HAD AN ENDOSCOPIC PROCEDURE TODAY AT [facility]:   Refer to the procedure report that was given to you for any specific questions about what was found during the examination.  If the procedure report does not answer your questions, please call your gastroenterologist to clarify.  If you requested that your care partner not be given the details of your procedure findings, then the procedure report has been included in a sealed envelope for you to review at your convenience later.  **Avoid taking all NSAIDS, ibuprofen, advil, naproxen, and aspirin**  YOU SHOULD EXPECT: Some feelings of bloating in the abdomen. Passage of more gas than usual.  Walking can help get rid of the air that was put into your GI tract during the procedure and reduce the bloating. If you had a lower endoscopy (such as a colonoscopy or flexible sigmoidoscopy) you may notice spotting of blood in your stool or on the toilet paper. If you underwent a bowel prep for your procedure, you may not have a normal bowel movement for a few days.  Please Note:  You might notice some irritation and congestion in your nose or some drainage.  This is from the oxygen used during your procedure.  There is no need for concern and it should clear up in a day or so.  SYMPTOMS TO REPORT IMMEDIATELY:   Following lower endoscopy (colonoscopy or flexible sigmoidoscopy):  Excessive amounts of blood in the stool  Significant tenderness or worsening of abdominal pains  Swelling of the abdomen that is new, acute  Fever of 100F or higher   Following upper endoscopy (EGD)  Vomiting of blood or coffee ground material  New chest pain or pain under the shoulder blades  Painful or persistently difficult swallowing  New shortness of breath  Fever of 100F or higher  Black, tarry-looking stools  For urgent or emergent issues, a gastroenterologist can be reached at any hour by calling [phone].   DIET:  We do recommend a  small meal at first, but then you may proceed to your regular diet.  Drink plenty of fluids but you should avoid alcoholic beverages for 24 hours.  ACTIVITY:  You should plan to take it easy for the rest of today and you should NOT DRIVE or use heavy machinery until tomorrow (because of the sedation medicines used during the test).    FOLLOW UP: Our staff will call the number listed on your records 48-72 hours following your procedure to check on you and address any questions or concerns that you may have regarding the information given to you following your procedure. If we do not reach you, we will leave a message.  We will attempt to reach you two times.  During this call, we will ask if you have developed any symptoms of COVID 19. If you develop any symptoms (ie: fever, flu-like symptoms, shortness of breath, cough etc.) before then, please call [phone].  If you test positive for Covid 19 in the 2 weeks post procedure, please call and report this information to [place].    If any biopsies were taken you will be contacted by phone or by letter within the next 1-3 weeks.  Please call us at [phone] if you have not heard about the biopsies in 3 weeks.    SIGNATURES/CONFIDENTIALITY: You and/or your care partner have signed paperwork which will be entered into your electronic medical record.  These signatures attest to the fact that that the information above  on your After Visit Summary has been reviewed and is understood.  Full responsibility of the confidentiality of this discharge information lies with you and/or your care-partner.

## 2018-12-19 NOTE — Progress Notes (Signed)
Report given to PACU, vss 

## 2018-12-19 NOTE — Progress Notes (Signed)
Called to room to assist during endoscopic procedure.  Patient ID and intended procedure confirmed with present staff. Received instructions for my participation in the procedure from the performing physician.  

## 2018-12-19 NOTE — Op Note (Signed)
[facility] Patient Name: Catherine Short Procedure Date: 12/19/2018 10:24 AM MRN: [id number] Endoscopist: Thornton Park MD, MD Age: 58 Referring MD:  Date of Birth: 1960/07/28 Gender: Female Account #: [id number] Procedure:                Colonoscopy Indications:              Screening for colorectal malignant neoplasm                           Last colonoscopy 2010                           History of cecal volvulus s/p right hemicolectomy                            02/2018                           History of partial SBO 08/2018 thought to be due to                            adhesions                           Remote history of microscopic colitis 2011                           No known family history of colon cancer or polyp Medicines:                Monitored Anesthesia Care Procedure:                Pre-Anesthesia Assessment:                           - Prior to the procedure, a History and Physical                            was performed, and patient medications and                            allergies were reviewed. The patient's tolerance of                            previous anesthesia was also reviewed. The risks                            and benefits of the procedure and the sedation                            options and risks were discussed with the patient.                            All questions were answered, and informed consent                            was obtained. Prior Anticoagulants: The patient  has                            taken no previous anticoagulant or antiplatelet                            agents. ASA Grade Assessment: II - A patient with                            mild systemic disease. After reviewing the risks                            and benefits, the patient was deemed in                            satisfactory condition to undergo the procedure.                           After obtaining informed consent, the colonoscope                        was passed under direct vision. Throughout the                            procedure, the patient's blood pressure, pulse, and                            oxygen saturations were monitored continuously. The                            Colonoscope was introduced through the anus and                            advanced to the 8 cm into the ileum. A second                            forward view of the right colon was performed. The                            colonoscopy was performed without difficulty. The                            patient tolerated the procedure well. The quality                            of the bowel preparation was good. The terminal                            ileum and the rectum were photographed. Scope In: 10:40:57 AM Scope Out: 10:55:24 AM Scope Withdrawal Time: 0 hours 9 minutes 0 seconds  Total Procedure Duration: 0 hours 14 minutes 27 seconds  Findings:                 The perianal and digital rectal examinations were  normal.                           There was evidence of a prior end-to-end                            ileo-colonic anastomosis in the ascending colon.                            The anastomosis appears healthy without ulceration.                            No visible suture material.                           The exam was otherwise without abnormality on                            direct and retroflexion views. Complications:            No immediate complications. Estimated Blood Loss:     Estimated blood loss: none. Impression:               - End-to-end ileo-colonic anastomosis.                           - The examination was otherwise normal on direct                            and retroflexion views.                           - No specimens collected. Recommendation:           - Patient has a contact number available for                            emergencies. The signs and symptoms of potential                             delayed complications were discussed with the                            patient. Return to normal activities tomorrow.                            Written discharge instructions were provided to the                            patient.                           - Resume previous diet.                           - Continue present medications.                           -  Repeat colonoscopy in 10 years for screening                            purposes, earlier with any new symptoms. Thornton Park MD, MD 12/19/2018 11:14:14 AM This report has been signed electronically.

## 2018-12-19 NOTE — Progress Notes (Signed)
Vs-KA Temp-LC

## 2018-12-21 ENCOUNTER — Telehealth: Payer: Self-pay

## 2018-12-21 NOTE — Telephone Encounter (Signed)
  Follow up Call-  Call back number 12/19/2018  Post procedure Call Back phone  # [phone]  Permission to leave phone message Yes  Some recent data might be hidden     Patient questions:  Do you have a fever, pain , or abdominal swelling? No. Pain Score  0 *  Have you tolerated food without any problems? Yes.    Have you been able to return to your normal activities? Yes.    Do you have any questions about your discharge instructions: Diet   No. Medications  No. Follow up visit  No.  Do you have questions or concerns about your Care? No.  Actions: * If pain score is 4 or above: No action needed, pain <4. 1. Have you developed a fever since your procedure? no  2.   Have you had an respiratory symptoms (SOB or cough) since your procedure? no  3.   Have you tested positive for COVID 19 since your procedure no  4.   Have you had any family members/close contacts diagnosed with the COVID 19 since your procedure?  no   If yes to any of these questions please route to Joylene John, RN and Alphonsa Gin, Therapist, sports.

## 2018-12-22 ENCOUNTER — Other Ambulatory Visit: Payer: Self-pay | Admitting: Physician Assistant

## 2018-12-22 ENCOUNTER — Encounter: Payer: Self-pay | Admitting: Gastroenterology

## 2018-12-22 NOTE — Progress Notes (Signed)
Error

## 2018-12-23 DIAGNOSIS — Z1231 Encounter for screening mammogram for malignant neoplasm of breast: Secondary | ICD-10-CM | POA: Diagnosis not present

## 2018-12-26 ENCOUNTER — Encounter: Payer: Self-pay | Admitting: Family Medicine

## 2019-01-02 ENCOUNTER — Other Ambulatory Visit: Payer: Self-pay

## 2019-01-02 NOTE — Progress Notes (Signed)
Assessment & Plan:  1. Anxiety - Venlafaxine increased from 75 mg 250 mg once daily.  GeneSight testing performed today.   - venlafaxine XR (EFFEXOR-XR) 150 MG 24 hr capsule; Take 1 capsule (150 mg total) by mouth daily with breakfast.  Dispense: 90 capsule; Refill: 1  2. Vaginal yeast infection - Education provided on vaginal yeast infections. - fluconazole (DIFLUCAN) 150 MG tablet; Take 1 tablet (150 mg total) by mouth once for 1 dose. May repeat dose after 3 days.  Dispense: 2 tablet; Refill: 0  3. Anemia, unspecified type - CBC with Differential/Platelet   Return in about 6 weeks (around 02/14/2019) for anxiety.  Catherine Limes, MSN, APRN, FNP-C [facility]  Subjective:    Patient ID: Catherine Short, female    DOB: [birth year], 58 y.o.   MRN: [id number]  Patient Care Team: Loman Brooklyn, FNP as PCP - General (Family Medicine)   Chief Complaint:  Chief Complaint  Patient presents with  . Anxiety    6 week follow up    HPI: Catherine Short is a 58 y.o. female presenting on 01/03/2019 for Anxiety (6 week follow up)  Patient was started on venlafaxine 37.5 mg QD 12 weeks ago after failing therapy previously with Prozac, Zoloft, and Lexapro. She tried but was not completed happy previously with Wellbutrin and Buspirone.  The venlafaxine was increased to 75 mg once daily approximately 6 weeks ago.  Patient feels the increase has been helpful and would like to further increase her medication.   GAD 7 : Generalized Anxiety Score 01/03/2019 11/18/2018 10/07/2018  Nervous, Anxious, on Edge 2 1 3   Control/stop worrying 2 2 2   Worry too much - different things 1 1 3   Trouble relaxing 1 2 3   Restless 3 2 3   Easily annoyed or irritable 1 2 3   Afraid - awful might happen 1 1 2   Total GAD 7 Score 11 11 19   Anxiety Difficulty Not difficult at all Somewhat difficult Somewhat difficult    New complaints: Patient currently reports a yeast infection with  itching and thick white vaginal discharge.  Social history:  Relevant past medical, surgical, family and social history reviewed and updated as indicated. Interim medical history since our last visit reviewed.  Allergies and medications reviewed and updated.  DATA REVIEWED: CHART IN EPIC  ROS: Negative unless specifically indicated above in HPI.    Current Outpatient Medications:  .  acetaminophen (TYLENOL) 325 MG tablet, Take 2 tablets (650 mg total) by mouth every 6 (six) hours as needed for mild pain (or Fever >/= 101)., Disp: 12 tablet, Rfl: 0 .  Amino Acids (AMINO ACID PO), Take 1 tablet by mouth every morning., Disp: , Rfl:  .  ASHWAGANDHA PO, Take 1 tablet by mouth every morning. , Disp: , Rfl:  .  Cholecalciferol (VITAMIN D-3) 25 MCG (1000 UT) CAPS, Take 1,000 Units by mouth every morning. , Disp: , Rfl:  .  COLLAGEN PO, Take 1 tablet by mouth every morning. , Disp: , Rfl:  .  diclofenac Sodium (VOLTAREN) 1 % GEL, , Disp: , Rfl:  .  dicyclomine (BENTYL) 20 MG tablet, Take 20 mg by mouth 3 (three) times daily., Disp: , Rfl:  .  erythromycin ophthalmic ointment, , Disp: , Rfl:  .  GLUTATHIONE PO, Take 500 mg by mouth daily., Disp: , Rfl:  .  levocetirizine (XYZAL) 5 MG tablet, Take 5 mg by mouth 4 (four) times a week. , Disp: ,  Rfl:  .  LINZESS 145 MCG CAPS capsule, TAKE 1 CAPSULE BY MOUTH  DAILY BEFORE BREAKFAST, Disp: 90 capsule, Rfl: 3 .  Omega-3 Fatty Acids (FISH OIL PO), Take 1 capsule by mouth every evening. DHA and EFA-daily , Disp: , Rfl:  .  ondansetron (ZOFRAN) 4 MG tablet, Take 1 tablet (4 mg total) by mouth every 6 (six) hours as needed for nausea or vomiting., Disp: 20 tablet, Rfl: 0 .  pantoprazole (PROTONIX) 40 MG tablet, Take 1 tablet (40 mg total) by mouth 2 (two) times daily before a meal., Disp: 180 tablet, Rfl: 0 .  PREMARIN vaginal cream, Place 1 Applicatorful vaginally at bedtime. , Disp: , Rfl:  .  SUMAtriptan (IMITREX) 25 MG tablet, Take 1 tablet (25 mg  total) by mouth every 2 (two) hours as needed for migraine. May repeat in 2 hours if headache persists or recurs., Disp: 30 tablet, Rfl: 1 .  tretinoin (RETIN-A) 0.1 % cream, Apply 1 application topically at bedtime., Disp: , Rfl:  .  valACYclovir (VALTREX) 500 MG tablet, Take 500 mg by mouth daily., Disp: , Rfl:  .  venlafaxine XR (EFFEXOR-XR) 150 MG 24 hr capsule, Take 1 capsule (150 mg total) by mouth daily with breakfast., Disp: 90 capsule, Rfl: 1 .  vitamin E 400 UNIT capsule, Take 400 Units by mouth daily. , Disp: , Rfl:    Allergies  Allergen Reactions  . Flexeril [Cyclobenzaprine] Other (See Comments)    disoriented  . Nsaids Other (See Comments)    Avoid use due to gastric ulcers.    Past Medical History:  Diagnosis Date  . Allergy   . Anemia    d/t bleeding ulcers  . Anxiety   . Arthritis   . Chronic constipation   . Colitis   . Depression   . Fibroid   . GERD (gastroesophageal reflux disease)   . H/O cold sores   . H/O removal of cyst 10/2018   right arm   . Headache   . Heart murmur    "when I was 18"  . Hormone replacement therapy (HRT) 11/14/2014  . Hot flashes 11/14/2014  . Increased endometrial stripe thickness 05/30/2015   Will get biopsy  . Insomnia   . Liver cyst   . Menopause 11/14/2014  . [place] 05/23/2015  . Nerves 11/14/2014  . Night sweats 11/14/2014  . PMB (postmenopausal bleeding) 05/23/2015  . Prolactinoma ([place])   . Rectocele 05/23/2015  . Rotator cuff syndrome of left shoulder 10/30/2010  . Stomach ulcer   . Upper GI bleed 11/01/2018   due to ulcers    Past Surgical History:  Procedure Laterality Date  . ABDOMINAL SURGERY    . APPENDECTOMY    . AUGMENTATION MAMMAPLASTY  2000   saline implants  . BIOPSY  11/01/2018   Procedure: BIOPSY;  Surgeon: Danie Binder, MD;  Location: [facility];  Service: Endoscopy;;  . [place]  . CARPAL TUNNEL RELEASE    . COLONOSCOPY    . ESOPHAGOGASTRODUODENOSCOPY (EGD) WITH PROPOFOL  N/A 11/01/2018   Procedure: ESOPHAGOGASTRODUODENOSCOPY (EGD) WITH PROPOFOL;  Surgeon: Danie Binder, MD;  Location: [facility];  Service: Endoscopy;  Laterality: N/A;  . LAPAROSCOPIC RIGHT HEMI COLECTOMY Right 02/27/2018   Procedure: LAPAROSCOPIC RIGHT HEMI COLECTOMY;  Surgeon: [name] Signs, MD;  Location: [facility] OR[facility];  Service: General;  Laterality: Right;  . LAPAROTOMY N/A 02/27/2018   Procedure: EXPLORATORY LAPAROTOMY;  Surgeon: [name] Signs, MD;  Location: [facility];  Service: General;  Laterality: N/A;  . TONSILECTOMY, ADENOIDECTOMY, BILATERAL MYRINGOTOMY AND TUBES    . UPPER GASTROINTESTINAL ENDOSCOPY      Social History   Socioeconomic History  . Marital status: Married    Spouse name: Not on file  . Number of children: 1  . Years of education: Not on file  . Highest education level: Not on file  Occupational History  . Occupation: Best boy: [employer]  Tobacco Use  . Smoking status: Former Smoker    Years: 6.00    Types: Cigarettes  . Smokeless tobacco: Never Used  Substance and Sexual Activity  . Alcohol use: Yes    Alcohol/week: 2.0 standard drinks    Types: 2 Glasses of wine per week    Comment: twice a week  . Drug use: No  . Sexual activity: Yes    Partners: Male    Birth control/protection: None, Other-see comments    Comment: husband with vasectomy  Other Topics Concern  . Not on file  Social History Narrative  . Not on file   Social Determinants of Health   Financial Resource Strain:   . Difficulty of Paying Living Expenses: Not on file  Food Insecurity:   . Worried About Charity fundraiser in the Last Year: Not on file  . Ran Out of Food in the Last Year: Not on file  Transportation Needs:   . Lack of Transportation (Medical): Not on file  . Lack of Transportation (Non-Medical): Not on file  Physical Activity:   . Days of Exercise per Week: Not on file  . Minutes of Exercise per Session: Not on file  Stress:   .  Feeling of Stress : Not on file  Social Connections:   . Frequency of Communication with Friends and Family: Not on file  . Frequency of Social Gatherings with Friends and Family: Not on file  . Attends Religious Services: Not on file  . Active Member of Clubs or Organizations: Not on file  . Attends Archivist Meetings: Not on file  . Marital Status: Not on file  Intimate Partner Violence:   . Fear of Current or Ex-Partner: Not on file  . Emotionally Abused: Not on file  . Physically Abused: Not on file  . Sexually Abused: Not on file        Objective:    BP 120/73   Pulse 66   Temp 97.8 F (36.6 C) (Temporal)   Ht 5\' 3"  (1.6 m)   Wt 136 lb 12.8 oz (62.1 kg)   LMP 07/05/2016 (Approximate) Comment: neg upt  SpO2 100%   BMI 24.23 kg/m   Physical Exam Vitals reviewed.  Constitutional:      General: She is not in acute distress.    Appearance: Normal appearance. She is normal weight. She is not ill-appearing, toxic-appearing or diaphoretic.  HENT:     Head: Normocephalic and atraumatic.  Eyes:     General: No scleral icterus.       Right eye: No discharge.        Left eye: No discharge.     Conjunctiva/sclera: Conjunctivae normal.  Cardiovascular:     Rate and Rhythm: Normal rate.  Pulmonary:     Effort: Pulmonary effort is normal. No respiratory distress.  Musculoskeletal:        General: Normal range of motion.     Cervical back: Normal range of motion.  Skin:  General: Skin is warm and dry.     Capillary Refill: Capillary refill takes less than 2 seconds.  Neurological:     General: No focal deficit present.     Mental Status: She is alert and oriented to person, place, and time. Mental status is at baseline.  Psychiatric:        Mood and Affect: Mood normal.        Behavior: Behavior normal.        Thought Content: Thought content normal.        Judgment: Judgment normal.     Lab Results  Component Value Date   TSH 1.660 08/19/2016   Lab  Results  Component Value Date   WBC 6.5 01/03/2019   HGB 10.0 (L) 01/03/2019   HCT 31.8 (L) 01/03/2019   MCV 86 01/03/2019   PLT 233 01/03/2019   Lab Results  Component Value Date   NA 143 11/21/2018   K 4.4 11/21/2018   CO2 26 11/21/2018   GLUCOSE 91 11/21/2018   BUN 16 11/21/2018   CREATININE 0.83 11/21/2018   BILITOT 0.8 10/31/2018   ALKPHOS 35 (L) 10/31/2018   AST 23 10/31/2018   ALT 24 10/31/2018   PROT 5.8 (L) 10/31/2018   ALBUMIN 3.5 10/31/2018   CALCIUM 9.0 11/21/2018   ANIONGAP 5 11/01/2018   Lab Results  Component Value Date   CHOL 163 11/21/2018   Lab Results  Component Value Date   HDL 81 11/21/2018   Lab Results  Component Value Date   LDLCALC 69 11/21/2018   Lab Results  Component Value Date   TRIG 69 11/21/2018   Lab Results  Component Value Date   CHOLHDL 2.0 11/21/2018   Lab Results  Component Value Date   HGBA1C 5.4 05/23/2015

## 2019-01-03 ENCOUNTER — Encounter: Payer: Self-pay | Admitting: Family Medicine

## 2019-01-03 ENCOUNTER — Ambulatory Visit (INDEPENDENT_AMBULATORY_CARE_PROVIDER_SITE_OTHER): Payer: BC Managed Care – PPO | Admitting: Family Medicine

## 2019-01-03 VITALS — BP 120/73 | HR 66 | Temp 97.8°F | Ht 63.0 in | Wt 136.8 lb

## 2019-01-03 DIAGNOSIS — B3731 Acute candidiasis of vulva and vagina: Secondary | ICD-10-CM

## 2019-01-03 DIAGNOSIS — F419 Anxiety disorder, unspecified: Secondary | ICD-10-CM | POA: Diagnosis not present

## 2019-01-03 DIAGNOSIS — B373 Candidiasis of vulva and vagina: Secondary | ICD-10-CM | POA: Diagnosis not present

## 2019-01-03 DIAGNOSIS — D649 Anemia, unspecified: Secondary | ICD-10-CM

## 2019-01-03 DIAGNOSIS — F411 Generalized anxiety disorder: Secondary | ICD-10-CM | POA: Diagnosis not present

## 2019-01-03 MED ORDER — FLUCONAZOLE 150 MG PO TABS: 150.0000 mg | ORAL_TABLET | Freq: Once | ORAL | 0 refills | Status: AC

## 2019-01-03 MED ORDER — VENLAFAXINE HCL ER 150 MG PO CP24: 150.0000 mg | ORAL_CAPSULE | Freq: Every day | ORAL | 1 refills | Status: DC

## 2019-01-03 NOTE — Patient Instructions (Signed)
Vaginal Yeast infection, Adult  Vaginal yeast infection is a condition that causes vaginal discharge as well as soreness, swelling, and redness (inflammation) of the vagina. This is a common condition. Some women get this infection frequently. What are the causes? This condition is caused by a change in the normal balance of the yeast (candida) and bacteria that live in the vagina. This change causes an overgrowth of yeast, which causes the inflammation. What increases the risk? The condition is more likely to develop in women who:  Take antibiotic medicines.  Have diabetes.  Take birth control pills.  Are pregnant.  Douche often.  Have a weak body defense system (immune system).  Have been taking steroid medicines for a long time.  Frequently wear tight clothing. What are the signs or symptoms? Symptoms of this condition include:  White, thick, creamy vaginal discharge.  Swelling, itching, redness, and irritation of the vagina. The lips of the vagina (vulva) may be affected as well.  Pain or a burning feeling while urinating.  Pain during sex. How is this diagnosed? This condition is diagnosed based on:  Your medical history.  A physical exam.  A pelvic exam. Your health care provider will examine a sample of your vaginal discharge under a microscope. Your health care provider may send this sample for testing to confirm the diagnosis. How is this treated? This condition is treated with medicine. Medicines may be over-the-counter or prescription. You may be told to use one or more of the following:  Medicine that is taken by mouth (orally).  Medicine that is applied as a cream (topically).  Medicine that is inserted directly into the vagina (suppository). Follow these instructions at home:  Lifestyle  Do not have sex until your health care provider approves. Tell your sex partner that you have a yeast infection. That person should go to his or her health care  provider and ask if they should also be treated.  Do not wear tight clothes, such as pantyhose or tight pants.  Wear breathable cotton underwear. General instructions  Take or apply over-the-counter and prescription medicines only as told by your health care provider.  Eat more yogurt. This may help to keep your yeast infection from returning.  Do not use tampons until your health care provider approves.  Try taking a sitz bath to help with discomfort. This is a warm water bath that is taken while you are sitting down. The water should only come up to your hips and should cover your buttocks. Do this 3-4 times per day or as told by your health care provider.  Do not douche.  If you have diabetes, keep your blood sugar levels under control.  Keep all follow-up visits as told by your health care provider. This is important. Contact a health care provider if:  You have a fever.  Your symptoms go away and then return.  Your symptoms do not get better with treatment.  Your symptoms get worse.  You have new symptoms.  You develop blisters in or around your vagina.  You have blood coming from your vagina and it is not your menstrual period.  You develop pain in your abdomen. Summary  Vaginal yeast infection is a condition that causes discharge as well as soreness, swelling, and redness (inflammation) of the vagina.  This condition is treated with medicine. Medicines may be over-the-counter or prescription.  Take or apply over-the-counter and prescription medicines only as told by your health care provider.  Do not douche.   Do not have sex or use tampons until your health care provider approves.  Contact a health care provider if your symptoms do not get better with treatment or your symptoms go away and then return. This information is not intended to replace advice given to you by your health care provider. Make sure you discuss any questions you have with your health care  provider. Document Released: 10/01/2004 Document Revised: 05/10/2017 Document Reviewed: 05/10/2017 Elsevier Patient Education  2020 Elsevier Inc.  

## 2019-01-04 LAB — CBC WITH DIFFERENTIAL/PLATELET
Basophils Absolute: 0 10*3/uL (ref 0.0–0.2)
Basos: 1 %
EOS (ABSOLUTE): 0.1 10*3/uL (ref 0.0–0.4)
Eos: 1 %
Hematocrit: 31.8 % — ABNORMAL LOW (ref 34.0–46.6)
Hemoglobin: 10 g/dL — ABNORMAL LOW (ref 11.1–15.9)
Immature Grans (Abs): 0 10*3/uL (ref 0.0–0.1)
Immature Granulocytes: 0 %
Lymphocytes Absolute: 2.2 10*3/uL (ref 0.7–3.1)
Lymphs: 35 %
MCH: 27 pg (ref 26.6–33.0)
MCHC: 31.4 g/dL — ABNORMAL LOW (ref 31.5–35.7)
MCV: 86 fL (ref 79–97)
Monocytes Absolute: 0.6 10*3/uL (ref 0.1–0.9)
Monocytes: 10 %
Neutrophils Absolute: 3.5 10*3/uL (ref 1.4–7.0)
Neutrophils: 53 %
Platelets: 233 10*3/uL (ref 150–450)
RBC: 3.7 x10E6/uL — ABNORMAL LOW (ref 3.77–5.28)
RDW: 14.4 % (ref 11.7–15.4)
WBC: 6.5 10*3/uL (ref 3.4–10.8)

## 2019-01-05 ENCOUNTER — Telehealth: Payer: Self-pay | Admitting: Gastroenterology

## 2019-01-05 NOTE — Telephone Encounter (Signed)
Called the patient back. Hgb is 10.0 PCP has advised iron supplement. See the lab for documentation. Encouraged the patient to follow the recommendations. If she finds she cannot tolerate it, becomes nauseous, constipation issues worsen, or other concerns, to let us know or her PCP know.

## 2019-01-05 NOTE — Telephone Encounter (Signed)
Patient was last seen in the office on 10/12 and 10/19 by [name] [name]. Please forward to correct nurse. Thank you.

## 2019-01-08 ENCOUNTER — Encounter: Payer: Self-pay | Admitting: Family Medicine

## 2019-01-09 ENCOUNTER — Encounter: Payer: Self-pay | Admitting: Family Medicine

## 2019-01-17 ENCOUNTER — Telehealth: Payer: Self-pay | Admitting: Gastroenterology

## 2019-01-17 ENCOUNTER — Telehealth: Payer: Self-pay | Admitting: Physician Assistant

## 2019-01-17 NOTE — Telephone Encounter (Signed)
Spoke with the patient. She has been prescribed Amoxicillin for sinus infection. She wants to know if there is anything on her medication list that will interact or if she needs to use any precautions with the antibiotic. Advised her I do not see any area of concern and she should consult with the pharmacist to be certain this is correct. She thanks me for this information.

## 2019-01-17 NOTE — Telephone Encounter (Signed)
Pt requested a call back to discuss medications.

## 2019-01-17 NOTE — Telephone Encounter (Signed)
.  A user error has taken place: error

## 2019-01-18 ENCOUNTER — Encounter: Payer: Self-pay | Admitting: Family Medicine

## 2019-01-24 ENCOUNTER — Other Ambulatory Visit: Payer: Self-pay | Admitting: Gastroenterology

## 2019-01-24 DIAGNOSIS — K922 Gastrointestinal hemorrhage, unspecified: Secondary | ICD-10-CM

## 2019-01-25 ENCOUNTER — Encounter: Payer: Self-pay | Admitting: Family Medicine

## 2019-02-14 ENCOUNTER — Telehealth: Payer: Self-pay | Admitting: *Deleted

## 2019-02-14 NOTE — Telephone Encounter (Signed)
-----   Message from Dalene Seltzer, RN sent at 12/20/2018  4:26 PM EST ----- Regarding: Repeat EGD This patient needs a repeat EGD in 3 months.   This is being sent out in 8 weeks, schedule in 4 weeks.

## 2019-02-14 NOTE — Telephone Encounter (Signed)
Attempted to call the patient to schedule repeat EGD. She wanted to contact her insurance to find out the cost then call back and schedule.

## 2019-02-17 ENCOUNTER — Encounter: Payer: Self-pay | Admitting: Family Medicine

## 2019-02-21 ENCOUNTER — Encounter: Payer: Self-pay | Admitting: *Deleted

## 2019-02-21 NOTE — Telephone Encounter (Signed)
Pt returned your call.  She would also like to discuss PA for Linzess.

## 2019-02-21 NOTE — Telephone Encounter (Signed)
Catherine Short, this patient wanted to discuss her PA for Linzess.   She wants another PA sent in for her Linzess, she wants to get it cheaper and stated the PA needed to say it was for "step therapy" in order for it to be less expensive. She is already getting it for $50 but says this is too expensive. Feel free to call her if you need to. Thank you.    [name], this patient is requesting an increase in her dose of Linzess to 290 mcg from the 145 mcg.

## 2019-02-21 NOTE — Telephone Encounter (Signed)
Peter Congo, please submit PA for Linzess 290 mcg per [name] [name].

## 2019-02-21 NOTE — Telephone Encounter (Signed)
Ok to increase Linzess to 290 mcg daily

## 2019-02-22 ENCOUNTER — Telehealth (INDEPENDENT_AMBULATORY_CARE_PROVIDER_SITE_OTHER): Payer: BC Managed Care – PPO | Admitting: Family Medicine

## 2019-02-22 ENCOUNTER — Encounter: Payer: Self-pay | Admitting: Family Medicine

## 2019-02-22 DIAGNOSIS — B029 Zoster without complications: Secondary | ICD-10-CM

## 2019-02-22 DIAGNOSIS — J069 Acute upper respiratory infection, unspecified: Secondary | ICD-10-CM | POA: Diagnosis not present

## 2019-02-22 MED ORDER — VALACYCLOVIR HCL 1 G PO TABS: 1000.0000 mg | ORAL_TABLET | Freq: Three times a day (TID) | ORAL | 0 refills | Status: AC

## 2019-02-22 MED ORDER — AZITHROMYCIN 250 MG PO TABS: ORAL_TABLET | ORAL | 0 refills | Status: DC

## 2019-02-22 NOTE — Progress Notes (Signed)
MyChart Video visit  Subjective: CC:? shingles PCP: Loman Brooklyn, FNP [id number] Catherine Short is a 59 y.o. female calls for video consult today. Patient provides verbal consent for consult held via mychart video.  Due to COVID-19 pandemic this visit was conducted virtually. This visit type was conducted due to national recommendations for restrictions regarding the COVID-19 Pandemic (e.g. social distancing, sheltering in place) in an effort to limit this patient's exposure and mitigate transmission in our community. All issues noted in this document were discussed and addressed.  A physical exam was not performed with this format.   Location of patient: Home Location of provider: [facility] Others present for call: None  1.  Rash Patient reports about 4 days ago she noticed a patch of vesicular lesions underneath her left armpit.  She noticed new lesions on the superior left breast recently.  She describes diffuse itching but no pain or burning.  She has been under more stress over the last couple weeks, as her son is now residing with her.  She also was ill with Covid.  She has ongoing green phlegm and wonders if she may need an antibiotic for this.  Does not report any shortness of breath, fevers, wheezing.  No hemoptysis.   ROS: Per HPI  Allergies  Allergen Reactions  . Flexeril [Cyclobenzaprine] Other (See Comments)    disoriented  . Nsaids Other (See Comments)    Avoid use due to gastric ulcers.    Past Medical History:  Diagnosis Date  . Allergy   . Anemia    d/t bleeding ulcers  . Anxiety   . Arthritis   . Chronic constipation   . Colitis   . Depression   . Fibroid   . GERD (gastroesophageal reflux disease)   . H/O cold sores   . H/O removal of cyst 10/2018   right arm   . Headache   . Heart murmur    "when I was 18"  . Hormone replacement therapy (HRT) 11/14/2014  . Hot flashes 11/14/2014  . Increased endometrial stripe thickness 05/30/2015   Will get biopsy  .  Insomnia   . Liver cyst   . Menopause 11/14/2014  . [place] 05/23/2015  . Nerves 11/14/2014  . Night sweats 11/14/2014  . PMB (postmenopausal bleeding) 05/23/2015  . Prolactinoma ([place])   . Rectocele 05/23/2015  . Rotator cuff syndrome of left shoulder 10/30/2010  . Stomach ulcer   . Upper GI bleed 11/01/2018   due to ulcers    Current Outpatient Medications:  .  acetaminophen (TYLENOL) 325 MG tablet, Take 2 tablets (650 mg total) by mouth every 6 (six) hours as needed for mild pain (or Fever >/= 101)., Disp: 12 tablet, Rfl: 0 .  Amino Acids (AMINO ACID PO), Take 1 tablet by mouth every morning., Disp: , Rfl:  .  ASHWAGANDHA PO, Take 1 tablet by mouth every morning. , Disp: , Rfl:  .  Cholecalciferol (VITAMIN D-3) 25 MCG (1000 UT) CAPS, Take 1,000 Units by mouth every morning. , Disp: , Rfl:  .  COLLAGEN PO, Take 1 tablet by mouth every morning. , Disp: , Rfl:  .  diclofenac Sodium (VOLTAREN) 1 % GEL, , Disp: , Rfl:  .  dicyclomine (BENTYL) 20 MG tablet, Take 20 mg by mouth 3 (three) times daily., Disp: , Rfl:  .  erythromycin ophthalmic ointment, , Disp: , Rfl:  .  GLUTATHIONE PO, Take 500 mg by mouth daily., Disp: , Rfl:  .  levocetirizine (XYZAL)  5 MG tablet, Take 5 mg by mouth 4 (four) times a week. , Disp: , Rfl:  .  LINZESS 145 MCG CAPS capsule, TAKE 1 CAPSULE BY MOUTH  DAILY BEFORE BREAKFAST, Disp: 90 capsule, Rfl: 3 .  Omega-3 Fatty Acids (FISH OIL PO), Take 1 capsule by mouth every evening. DHA and EFA-daily , Disp: , Rfl:  .  ondansetron (ZOFRAN) 4 MG tablet, Take 1 tablet (4 mg total) by mouth every 6 (six) hours as needed for nausea or vomiting., Disp: 20 tablet, Rfl: 0 .  pantoprazole (PROTONIX) 40 MG tablet, TAKE 1 TABLET BY MOUTH  TWICE DAILY BEFORE A MEAL, Disp: 180 tablet, Rfl: 3 .  PREMARIN vaginal cream, Place 1 Applicatorful vaginally at bedtime. , Disp: , Rfl:  .  SUMAtriptan (IMITREX) 25 MG tablet, Take 1 tablet (25 mg total) by mouth every 2 (two) hours as needed for  migraine. May repeat in 2 hours if headache persists or recurs., Disp: 30 tablet, Rfl: 1 .  tretinoin (RETIN-A) 0.1 % cream, Apply 1 application topically at bedtime., Disp: , Rfl:  .  valACYclovir (VALTREX) 500 MG tablet, Take 500 mg by mouth daily., Disp: , Rfl:  .  venlafaxine XR (EFFEXOR-XR) 150 MG 24 hr capsule, Take 1 capsule (150 mg total) by mouth daily with breakfast., Disp: 90 capsule, Rfl: 1 .  vitamin E 400 UNIT capsule, Take 400 Units by mouth daily. , Disp: , Rfl:   Pulmonary: Normal work of breathing.  Does not appear to be in distress.  No wheezes. Skin: Vesicular rash noted in the left axilla and milder vesicular rash noted along the superior left breast.   Assessment/ Plan: 59 y.o. female   1. Herpes zoster without complication Affected areas are consistent with a T1 dermatomal distribution.  We discussed that impact of treatment with antivirals after 72 hours is unknown.  However given new lesions noted on the anterior superior breast, I suspect that she still having viral replication.  For this reason I have prescribed her Valtrex 1 g 3 times daily for 7 days.  Home care instructions were reviewed.  We discussed the contagiousness of this infection.  Recommending keeping the areas covered and avoiding immunocompromised people's. - valACYclovir (VALTREX) 1000 MG tablet; Take 1 tablet (1,000 mg total) by mouth 3 (three) times daily for 7 days.  Dispense: 21 tablet; Refill: 0  2. Upper respiratory tract infection, unspecified type We will prickly treat with a Z-Pak given ongoing symptoms despite OTC therapies.  Reinforced that if this is postviral this is unlikely to help.  Continue conservative care at home - azithromycin (ZITHROMAX) 250 MG tablet; Take 2 tablets today, then take 1 tablet daily until gone.  Dispense: 6 tablet; Refill: 0   Start time: 4:41pm End time: 4:47pm  Total time spent on patient care (including telephone call/ virtual visit): 15 minutes  [place], [place] [phone]

## 2019-02-22 NOTE — Patient Instructions (Signed)
Shingles  Shingles, which is also known as herpes zoster, is an infection that causes a painful skin rash and fluid-filled blisters. It is caused by a virus. Shingles only develops in people who:  Have had chickenpox.  Have been given a medicine to protect against chickenpox (have been vaccinated). Shingles is rare in this group. What are the causes? Shingles is caused by varicella-zoster virus (VZV). This is the same virus that causes chickenpox. After a person is exposed to VZV, the virus stays in the body in an inactive (dormant) state. Shingles develops if the virus is reactivated. This can happen many years after the first (initial) exposure to VZV. It is not known what causes this virus to be reactivated. What increases the risk? People who have had chickenpox or received the chickenpox vaccine are at risk for shingles. Shingles infection is more common in people who:  Are older than age 60.  Have a weakened disease-fighting system (immune system), such as people with: ? HIV. ? AIDS. ? Cancer.  Are taking medicines that weaken the immune system, such as transplant medicines.  Are experiencing a lot of stress. What are the signs or symptoms? Early symptoms of this condition include itching, tingling, and pain in an area on your skin. Pain may be described as burning, stabbing, or throbbing. A few days or weeks after early symptoms start, a painful red rash appears. The rash is usually on one side of the body and has a band-like or belt-like pattern. The rash eventually turns into fluid-filled blisters that break open, change into scabs, and dry up in about 2-3 weeks. At any time during the infection, you may also develop:  A fever.  Chills.  A headache.  An upset stomach. How is this diagnosed? This condition is diagnosed with a skin exam. Skin or fluid samples may be taken from the blisters before a diagnosis is made. These samples are examined under a microscope or sent to  a lab for testing. How is this treated? The rash may last for several weeks. There is not a specific cure for this condition. Your health care provider will probably prescribe medicines to help you manage pain, recover more quickly, and avoid long-term problems. Medicines may include:  Antiviral drugs.  Anti-inflammatory drugs.  Pain medicines.  Anti-itching medicines (antihistamines). If the area involved is on your face, you may be referred to a specialist, such as an eye doctor (ophthalmologist) or an ear, nose, and throat (ENT) doctor (otolaryngologist) to help you avoid eye problems, chronic pain, or disability. Follow these instructions at home: Medicines  Take over-the-counter and prescription medicines only as told by your health care provider.  Apply an anti-itch cream or numbing cream to the affected area as told by your health care provider. Relieving itching and discomfort   Apply cold, wet cloths (cold compresses) to the area of the rash or blisters as told by your health care provider.  Cool baths can be soothing. Try adding baking soda or dry oatmeal to the water to reduce itching. Do not bathe in hot water. Blister and rash care  Keep your rash covered with a loose bandage (dressing). Wear loose-fitting clothing to help ease the pain of material rubbing against the rash.  Keep your rash and blisters clean by washing the area with mild soap and cool water as told by your health care provider.  Check your rash every day for signs of infection. Check for: ? More redness, swelling, or pain. ? Fluid   or blood. ? Warmth. ? Pus or a bad smell.  Do not scratch your rash or pick at your blisters. To help avoid scratching: ? Keep your fingernails clean and cut short. ? Wear gloves or mittens while you sleep, if scratching is a problem. General instructions  Rest as told by your health care provider.  Keep all follow-up visits as told by your health care provider. This  is important.  Wash your hands often with soap and water. If soap and water are not available, use hand sanitizer. Doing this lowers your chance of getting a bacterial skin infection.  Before your blisters change into scabs, your shingles infection can cause chickenpox in people who have never had it or have never been vaccinated against it. To prevent this from happening, avoid contact with other people, especially: ? Babies. ? Pregnant women. ? Children who have eczema. ? Elderly people who have transplants. ? People who have chronic illnesses, such as cancer or AIDS. Contact a health care provider if:  Your pain is not relieved with prescribed medicines.  Your pain does not get better after the rash heals.  You have signs of infection in the rash area, such as: ? More redness, swelling, or pain around the rash. ? Fluid or blood coming from the rash. ? The rash area feeling warm to the touch. ? Pus or a bad smell coming from the rash. Get help right away if:  The rash is on your face or nose.  You have facial pain, pain around your eye area, or loss of feeling on one side of your face.  You have difficulty seeing.  You have ear pain or have ringing in your ear.  You have a loss of taste.  Your condition gets worse. Summary  Shingles, which is also known as herpes zoster, is an infection that causes a painful skin rash and fluid-filled blisters.  This condition is diagnosed with a skin exam. Skin or fluid samples may be taken from the blisters and examined before the diagnosis is made.  Keep your rash covered with a loose bandage (dressing). Wear loose-fitting clothing to help ease the pain of material rubbing against the rash.  Before your blisters change into scabs, your shingles infection can cause chickenpox in people who have never had it or have never been vaccinated against it. This information is not intended to replace advice given to you by your health care  provider. Make sure you discuss any questions you have with your health care provider. Document Revised: 04/15/2018 Document Reviewed: 08/26/2016 Elsevier Patient Education  2020 [employer]  

## 2019-03-02 NOTE — Telephone Encounter (Signed)
PA submitted today  03/02/19.

## 2019-03-07 ENCOUNTER — Other Ambulatory Visit: Payer: Self-pay

## 2019-03-07 ENCOUNTER — Ambulatory Visit (AMBULATORY_SURGERY_CENTER): Payer: Self-pay

## 2019-03-07 VITALS — Temp 97.2°F | Ht 63.0 in | Wt 130.0 lb

## 2019-03-07 DIAGNOSIS — Z01818 Encounter for other preprocedural examination: Secondary | ICD-10-CM

## 2019-03-07 DIAGNOSIS — Z8719 Personal history of other diseases of the digestive system: Secondary | ICD-10-CM

## 2019-03-07 NOTE — Progress Notes (Signed)

## 2019-03-08 ENCOUNTER — Telehealth: Payer: Self-pay

## 2019-03-08 NOTE — Telephone Encounter (Signed)
Patient informed that request was cancelled.  Her plan does not do tier exception.

## 2019-03-16 ENCOUNTER — Ambulatory Visit (INDEPENDENT_AMBULATORY_CARE_PROVIDER_SITE_OTHER): Payer: BC Managed Care – PPO

## 2019-03-16 ENCOUNTER — Other Ambulatory Visit: Payer: Self-pay | Admitting: Gastroenterology

## 2019-03-16 DIAGNOSIS — Z1159 Encounter for screening for other viral diseases: Secondary | ICD-10-CM | POA: Diagnosis not present

## 2019-03-17 LAB — SARS CORONAVIRUS 2 (TAT 6-24 HRS): SARS Coronavirus 2: NEGATIVE

## 2019-03-21 ENCOUNTER — Encounter: Payer: Self-pay | Admitting: Gastroenterology

## 2019-03-21 ENCOUNTER — Ambulatory Visit (AMBULATORY_SURGERY_CENTER): Payer: BC Managed Care – PPO | Admitting: Gastroenterology

## 2019-03-21 ENCOUNTER — Other Ambulatory Visit: Payer: Self-pay

## 2019-03-21 VITALS — BP 103/71 | HR 63 | Temp 97.7°F | Resp 8 | Ht 63.0 in | Wt 130.0 lb

## 2019-03-21 DIAGNOSIS — K2950 Unspecified chronic gastritis without bleeding: Secondary | ICD-10-CM

## 2019-03-21 DIAGNOSIS — K208 Other esophagitis without bleeding: Secondary | ICD-10-CM | POA: Diagnosis not present

## 2019-03-21 DIAGNOSIS — Z8719 Personal history of other diseases of the digestive system: Secondary | ICD-10-CM | POA: Diagnosis not present

## 2019-03-21 DIAGNOSIS — K219 Gastro-esophageal reflux disease without esophagitis: Secondary | ICD-10-CM | POA: Diagnosis not present

## 2019-03-21 MED ORDER — SODIUM CHLORIDE 0.9 % IV SOLN: 500.0000 mL | Freq: Once | INTRAVENOUS | Status: DC

## 2019-03-21 NOTE — Progress Notes (Signed)
Called to room to assist during endoscopic procedure.  Patient ID and intended procedure confirmed with present staff. Received instructions for my participation in the procedure from the performing physician.  

## 2019-03-21 NOTE — Progress Notes (Signed)
To PACU, VSS. Report to [email or web address] 

## 2019-03-21 NOTE — Progress Notes (Signed)
Temp by JR, vital by Cw   Pt's states no medical or surgical changes since previsit or office visit.

## 2019-03-21 NOTE — Op Note (Signed)
[facility] Patient Name: Catherine Short Procedure Date: 03/21/2019 10:56 AM MRN: [id number] Endoscopist: Thornton Park MD, MD Age: 59 Referring MD:  Date of Birth: 01/13/1960 Gender: Female Account #: [id number] Procedure:                Upper GI endoscopy Indications:              Follow-up of chronic gastric ulcer, Nausea,                            Frequent throat clearing Medicines:                Monitored Anesthesia Care Procedure:                Pre-Anesthesia Assessment:                           - Prior to the procedure, a History and Physical                            was performed, and patient medications and                            allergies were reviewed. The patient's tolerance of                            previous anesthesia was also reviewed. The risks                            and benefits of the procedure and the sedation                            options and risks were discussed with the patient.                            All questions were answered, and informed consent                            was obtained. Prior Anticoagulants: The patient has                            taken no previous anticoagulant or antiplatelet                            agents. ASA Grade Assessment: II - A patient with                            mild systemic disease. After reviewing the risks                            and benefits, the patient was deemed in                            satisfactory condition to undergo the procedure.  After obtaining informed consent, the endoscope was                            passed under direct vision. Throughout the                            procedure, the patient's blood pressure, pulse, and                            oxygen saturations were monitored continuously. The                            Endoscope was introduced through the mouth, and                            advanced to the third part of  duodenum. The upper                            GI endoscopy was accomplished without difficulty.                            The patient tolerated the procedure well. Scope In: Scope Out: Findings:                 The examined esophagus was normal. Biopsies were                            obtained from the proximal and distal esophagus                            with cold forceps for histology of suspected                            eosinophilic esophagitis.                           A deformity was found in the prepyloric region of                            the stomach in the area of prior gastric ulcers.                            There is some residual erythema in this area.                            Biopsies were taken from the location of the                            erythema as well as from the antrum, body, and                            fundus with a cold forceps for histology. Estimated  blood loss was minimal.                           The examined duodenum was normal. Biopsies were                            taken with a cold forceps for histology. Estimated                            blood loss was minimal. Complications:            No immediate complications. Estimated blood loss:                            Minimal. Estimated Blood Loss:     Estimated blood loss was minimal. Impression:               - Normal esophagus. Biopsied.                           - Evidence for healed gastric ulcer with residual                            deformity in the prepyloric region of the stomach.                            Biopsied.                           - Normal examined duodenum. Biopsied. Recommendation:           - Patient has a contact number available for                            emergencies. The signs and symptoms of potential                            delayed complications were discussed with the                            patient. Return to  normal activities tomorrow.                            Written discharge instructions were provided to the                            patient.                           - Resume previous diet.                           - Continue present medications including                            pantoprazole 40 mg twice daily.                           -  No aspirin, ibuprofen, naproxen, or other                            non-steroidal anti-inflammatory drugs.                           - Await pathology results.                           - Return to my office to address ongoing symptoms. Thornton Park MD, MD 03/21/2019 11:43:55 AM This report has been signed electronically.

## 2019-03-21 NOTE — Patient Instructions (Signed)
Continue present medications including Pantoprazole 40 mg twice a day.  No aspirin, ibuprofen, naproxen or other NSAIDS.  You may use Tylenol if needed.  Await pathology results.  YOU HAD AN ENDOSCOPIC PROCEDURE TODAY AT [facility]:   Refer to the procedure report that was given to you for any specific questions about what was found during the examination.  If the procedure report does not answer your questions, please call your gastroenterologist to clarify.  If you requested that your care partner not be given the details of your procedure findings, then the procedure report has been included in a sealed envelope for you to review at your convenience later.  YOU SHOULD EXPECT: Some feelings of bloating in the abdomen. Passage of more gas than usual.  Walking can help get rid of the air that was put into your GI tract during the procedure and reduce the bloating. If you had a lower endoscopy (such as a colonoscopy or flexible sigmoidoscopy) you may notice spotting of blood in your stool or on the toilet paper. If you underwent a bowel prep for your procedure, you may not have a normal bowel movement for a few days.  Please Note:  You might notice some irritation and congestion in your nose or some drainage.  This is from the oxygen used during your procedure.  There is no need for concern and it should clear up in a day or so.  SYMPTOMS TO REPORT IMMEDIATELY:   Following upper endoscopy (EGD)  Vomiting of blood or coffee ground material  New chest pain or pain under the shoulder blades  Painful or persistently difficult swallowing  New shortness of breath  Fever of 100F or higher  Black, tarry-looking stools  For urgent or emergent issues, a gastroenterologist can be reached at any hour by calling [phone]. Do not use MyChart messaging for urgent concerns.    DIET:  We do recommend a small meal at first, but then you may proceed to your regular diet.  Drink plenty  of fluids but you should avoid alcoholic beverages for 24 hours.  ACTIVITY:  You should plan to take it easy for the rest of today and you should NOT DRIVE or use heavy machinery until tomorrow (because of the sedation medicines used during the test).    FOLLOW UP: Our staff will call the number listed on your records 48-72 hours following your procedure to check on you and address any questions or concerns that you may have regarding the information given to you following your procedure. If we do not reach you, we will leave a message.  We will attempt to reach you two times.  During this call, we will ask if you have developed any symptoms of COVID 19. If you develop any symptoms (ie: fever, flu-like symptoms, shortness of breath, cough etc.) before then, please call [phone].  If you test positive for Covid 19 in the 2 weeks post procedure, please call and report this information to [place].    If any biopsies were taken you will be contacted by phone or by letter within the next 1-3 weeks.  Please call us at [phone] if you have not heard about the biopsies in 3 weeks.    SIGNATURES/CONFIDENTIALITY: You and/or your care partner have signed paperwork which will be entered into your electronic medical record.  These signatures attest to the fact that that the information above on your After Visit Summary has been reviewed and is understood.  Full responsibility of the confidentiality of this discharge information lies with you and/or your care-partner.

## 2019-03-23 ENCOUNTER — Telehealth: Payer: Self-pay | Admitting: *Deleted

## 2019-03-23 NOTE — Telephone Encounter (Signed)
  Follow up Call-  Call back number 03/21/2019 12/19/2018  Post procedure Call Back phone  # [phone]  Permission to leave phone message Yes Yes  Some recent data might be hidden     Patient questions:  Do you have a fever, pain , or abdominal swelling? No. Pain Score  0 *  Have you tolerated food without any problems? Yes.    Have you been able to return to your normal activities? Yes.    Do you have any questions about your discharge instructions: Diet   No. Medications  No. Follow up visit  No.  Do you have questions or concerns about your Care? No.  Actions: * If pain score is 4 or above: No action needed, pain <4.  1. Have you developed a fever since your procedure? no  2.   Have you had an respiratory symptoms (SOB or cough) since your procedure? no  3.   Have you tested positive for COVID 19 since your procedure no  4.   Have you had any family members/close contacts diagnosed with the COVID 19 since your procedure?  no   If yes to any of these questions please route to Joylene John, RN and Alphonsa Gin, Therapist, sports.

## 2019-04-03 ENCOUNTER — Other Ambulatory Visit: Payer: Self-pay | Admitting: Physician Assistant

## 2019-04-04 ENCOUNTER — Other Ambulatory Visit: Payer: Self-pay | Admitting: *Deleted

## 2019-04-04 NOTE — Telephone Encounter (Signed)
PATIENT NEEDED REFILL ON VALACYCLOVIR REFILL SENT TO [facility]

## 2019-04-07 ENCOUNTER — Ambulatory Visit: Payer: BC Managed Care – PPO | Attending: Internal Medicine

## 2019-04-07 DIAGNOSIS — Z23 Encounter for immunization: Secondary | ICD-10-CM

## 2019-04-07 NOTE — Progress Notes (Signed)
   Covid-19 Vaccination Clinic  Name:  Catherine Short    MRN: [id number] DOB: Aug 22, 1960  04/07/2019  [name] was observed post Covid-19 immunization for 15 minutes without incident. She was provided with Vaccine Information Sheet and instruction to access the V-Safe system.   [name] was instructed to call 911 with any severe reactions post vaccine: [name] Kitchen Difficulty breathing  . Swelling of face and throat  . A fast heartbeat  . A bad rash all over body  . Dizziness and weakness   Immunizations Administered    Name Date Dose VIS Date Route   Moderna COVID-19 Vaccine 04/07/2019 11:21 AM 0.5 mL 12/06/2018 Intramuscular   Manufacturer: Moderna   Lot: [id number]   [id number]

## 2019-04-17 DIAGNOSIS — M238X1 Other internal derangements of right knee: Secondary | ICD-10-CM | POA: Diagnosis not present

## 2019-04-17 DIAGNOSIS — M25561 Pain in right knee: Secondary | ICD-10-CM | POA: Diagnosis not present

## 2019-05-02 ENCOUNTER — Other Ambulatory Visit (INDEPENDENT_AMBULATORY_CARE_PROVIDER_SITE_OTHER): Payer: BC Managed Care – PPO

## 2019-05-02 ENCOUNTER — Ambulatory Visit (INDEPENDENT_AMBULATORY_CARE_PROVIDER_SITE_OTHER): Payer: BC Managed Care – PPO | Admitting: Gastroenterology

## 2019-05-02 ENCOUNTER — Encounter: Payer: Self-pay | Admitting: Gastroenterology

## 2019-05-02 VITALS — BP 110/60 | HR 73 | Temp 97.8°F | Ht 63.0 in | Wt 131.0 lb

## 2019-05-02 DIAGNOSIS — K5900 Constipation, unspecified: Secondary | ICD-10-CM | POA: Diagnosis not present

## 2019-05-02 DIAGNOSIS — K2990 Gastroduodenitis, unspecified, without bleeding: Secondary | ICD-10-CM | POA: Diagnosis not present

## 2019-05-02 DIAGNOSIS — D509 Iron deficiency anemia, unspecified: Secondary | ICD-10-CM

## 2019-05-02 DIAGNOSIS — K2970 Gastritis, unspecified, without bleeding: Secondary | ICD-10-CM | POA: Diagnosis not present

## 2019-05-02 LAB — IBC + FERRITIN
Ferritin: 44 ng/mL (ref 10.0–291.0)
Iron: 143 ug/dL (ref 42–145)
Saturation Ratios: 38.1 % (ref 20.0–50.0)
Transferrin: 268 mg/dL (ref 212.0–360.0)

## 2019-05-02 LAB — CBC WITH DIFFERENTIAL/PLATELET
Basophils Absolute: 0.1 10*3/uL (ref 0.0–0.1)
Basophils Relative: 0.6 % (ref 0.0–3.0)
Eosinophils Absolute: 0.2 10*3/uL (ref 0.0–0.7)
Eosinophils Relative: 2.4 % (ref 0.0–5.0)
HCT: 38.6 % (ref 36.0–46.0)
Hemoglobin: 13.1 g/dL (ref 12.0–15.0)
Lymphocytes Relative: 24.6 % (ref 12.0–46.0)
Lymphs Abs: 2.5 10*3/uL (ref 0.7–4.0)
MCHC: 33.9 g/dL (ref 30.0–36.0)
MCV: 94.9 fl (ref 78.0–100.0)
Monocytes Absolute: 0.7 10*3/uL (ref 0.1–1.0)
Monocytes Relative: 7 % (ref 3.0–12.0)
Neutro Abs: 6.6 10*3/uL (ref 1.4–7.7)
Neutrophils Relative %: 65.4 % (ref 43.0–77.0)
Platelets: 180 10*3/uL (ref 150.0–400.0)
RBC: 4.07 Mil/uL (ref 3.87–5.11)
RDW: 16 % — ABNORMAL HIGH (ref 11.5–15.5)
WBC: 10.2 10*3/uL (ref 4.0–10.5)

## 2019-05-02 MED ORDER — LINACLOTIDE 290 MCG PO CAPS: 290.0000 ug | ORAL_CAPSULE | Freq: Every day | ORAL | 3 refills | Status: DC

## 2019-05-02 NOTE — Progress Notes (Signed)
Referring Provider: Loman Brooklyn, FNP Primary Care Physician:  Loman Brooklyn, FNP  Chief complaint:  constipation  IMPRESSION:  NSAID related gastric ulcers Iron deficiency anemia on oral iron Chronic constipation    - No response to Miralax daily, Citrucel, magnesium oxide, swiss chris, Amitiza.  Internal hemorrhoids History of cecal volvulus status post right hemicolectomy 02/2018 History of partial small bowel obstruction hospitalized at [facility] 08/2018    -Obstruction thought to be due to adhesions and was managed conservatively Remote diagnosis of microscopic colitis in 2011 No polyps on colonoscopy 12/19/2018    - colonoscopy recommended again in 10 years   Chronic constipation not responding to Miralax daily, Citrucel, magnesium oxide, swiss chris, or Amitiza. Will increase Linzess to 290 mcg daily. If not responding, will trial Motegrity 2 mg daily.  H pylori negative gastritis, reflux, and PUD: Clinical improvement noted on last EGD. Continue pantoprazole 40 mg BID. Avoid all NSAIDs.   History of iron deficiency anemia: Likely due to history of NSAID-related PUD and gastritis. Will repeat labs today. If improved, will decrease or discontinue oral iron supplements. If iron remains low, will plan IV iron and capsule endoscopy of the small bowel.     PLAN: - Linzess 290 mcg daily - If not responding to Linzess will start Motegrity - CBC, iron, ferritin today    - if your iron has improved, will decrease oral iron supplements to QOD    - if iron levels remain low, will arrange for IV Feraheme - Continue to avoid all NSAIDs - Continue pantoprazole 40 mg BID - Follow-up in 3-4 months, earlier if needed  Please see the "Patient Instructions" section for addition details about the plan.  HPI: Catherine Short is a 59 y.o. female who returns in scheduled follow-up. Former Dr. 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 Seen in consultation 10/24/2578  [name] for chronic constipation after a hospitalization at [facility] for partial small bowel obstruction.   Patient was diagnosed with a cecal volvulus in February 2020 and underwent a right hemicolectomy per Dr. Arnoldo Morale at [facility].  Also carries a diagnosis of microscopic colitis diagnosed in 2011 but had not been on any treatment for that.  Her issues over many years have been constipation.  When she was seen in 2015 she had been on a trial of Linzess which she says worked well for quite a while and then seemed to stop working.  She was given Amitiza 24 twice daily cannot remember now whether or not that helped or not. Had a colonoscopy in 2010 that was normal except for internal hemorrhoids.  Started on Linzess 145 mcg daily at the time of her consultation, in addition to encouragement to drink at least 64 ounces of water daily and follow a high fiber diet.   Subsequently evaluated by Dr. Oneida Alar 11/01/18 when EGD revealed NSAID induced gastric ulcers.  Upper endoscopy 12/19/2018 showed healing gastric ulcers in the prepyloric region as well as 2 nonbleeding cratered gastric ulcers.  Colonoscopy 12/19/2018 showed end-to-end ileocolonic anastomosis and was otherwise normal.  Repeat colonoscopy recommended in 10 years  Follow-up EGD 03/21/19 showed evidence for healed gastric ulcer with residual deformity in the prepyloric region.  Was asked to continue pantoprazole 40 mg twice daily.  Gastric biopsies have repeatedly been negative for [name] and intestinal metaplasia. Esophageal biopsies showed reflux esophagitis.  Called in February with symptoms and persistent constipation and her Linzess was increased to 290  mcg daily. No response to Miralax daily, Citrucel, magnesium oxide, swiss chris.    Some nausea when she gets hungry. Ongoing bloating and eructation, worsened by eating.  No associated abdominal pain.  Constipated worsened by iron supplements that she has been taking sine  October.    Past Medical History:  Diagnosis Date  . Allergy   . Anemia    d/t bleeding ulcers  . Anxiety   . Arthritis   . Chronic constipation   . Colitis   . Depression   . Fibroid   . GERD (gastroesophageal reflux disease)   . H/O cold sores   . H/O removal of cyst 10/2018   right arm   . Headache   . Heart murmur    "when I was 18"  . Hormone replacement therapy (HRT) 11/14/2014  . Hot flashes 11/14/2014  . Increased endometrial stripe thickness 05/30/2015   Will get biopsy  . Insomnia   . Liver cyst   . Menopause 11/14/2014  . [place] 05/23/2015  . Nerves 11/14/2014  . Night sweats 11/14/2014  . PMB (postmenopausal bleeding) 05/23/2015  . Prolactinoma ([place])   . Rectocele 05/23/2015  . Rotator cuff syndrome of left shoulder 10/30/2010  . Stomach ulcer   . Upper GI bleed 11/01/2018   due to ulcers    Past Surgical History:  Procedure Laterality Date  . ABDOMINAL SURGERY    . APPENDECTOMY    . AUGMENTATION MAMMAPLASTY  2000   saline implants  . BIOPSY  11/01/2018   Procedure: BIOPSY;  Surgeon: Danie Binder, MD;  Location: [facility];  Service: Endoscopy;;  . [place]  . CARPAL TUNNEL RELEASE    . COLONOSCOPY  12/2018  . ESOPHAGOGASTRODUODENOSCOPY (EGD) WITH PROPOFOL N/A 11/01/2018   Procedure: ESOPHAGOGASTRODUODENOSCOPY (EGD) WITH PROPOFOL;  Surgeon: Danie Binder, MD;  Location: [facility];  Service: Endoscopy;  Laterality: N/A;  . LAPAROSCOPIC RIGHT HEMI COLECTOMY Right 02/27/2018   Procedure: LAPAROSCOPIC RIGHT HEMI COLECTOMY;  Surgeon: [name] Signs, MD;  Location: [facility] ORS;  Service: General;  Laterality: Right;  . LAPAROTOMY N/A 02/27/2018   Procedure: EXPLORATORY LAPAROTOMY;  Surgeon: [name] Signs, MD;  Location: [facility] ORS;  Service: General;  Laterality: N/A;  . TONSILECTOMY, ADENOIDECTOMY, BILATERAL MYRINGOTOMY AND TUBES    . UPPER GASTROINTESTINAL ENDOSCOPY      Current Outpatient Medications  Medication Sig Dispense Refill    . acetaminophen (TYLENOL) 325 MG tablet Take 2 tablets (650 mg total) by mouth every 6 (six) hours as needed for mild pain (or Fever >/= 101). 12 tablet 0  . Amino Acids (AMINO ACID PO) Take 1 tablet by mouth every morning.    . ASHWAGANDHA PO Take 1 tablet by mouth every morning.     . Cholecalciferol (VITAMIN D-3) 25 MCG (1000 UT) CAPS Take 1,000 Units by mouth every morning.     . COLLAGEN PO Take 1 tablet by mouth every morning.     . diclofenac Sodium (VOLTAREN) 1 % GEL     . dicyclomine (BENTYL) 20 MG tablet Take 20 mg by mouth 3 (three) times daily.    [name] Kitchen erythromycin ophthalmic ointment     . GLUTATHIONE PO Take 500 mg by mouth daily.    [name] Kitchen levocetirizine (XYZAL) 5 MG tablet Take 5 mg by mouth 4 (four) times a week.     [name] Kitchen LINZESS 145 MCG CAPS capsule TAKE 1 CAPSULE BY MOUTH  DAILY BEFORE BREAKFAST 90 capsule 3  . Omega-3  Fatty Acids (FISH OIL PO) Take 1 capsule by mouth every evening. DHA and EFA-daily     . ondansetron (ZOFRAN) 4 MG tablet Take 1 tablet (4 mg total) by mouth every 6 (six) hours as needed for nausea or vomiting. (Patient not taking: Reported on 03/21/2019) 20 tablet 0  . pantoprazole (PROTONIX) 40 MG tablet TAKE 1 TABLET BY MOUTH  TWICE DAILY BEFORE A MEAL 180 tablet 3  . PREMARIN vaginal cream Place 1 Applicatorful vaginally at bedtime.     . SUMAtriptan (IMITREX) 25 MG tablet Take 1 tablet (25 mg total) by mouth every 2 (two) hours as needed for migraine. May repeat in 2 hours if headache persists or recurs. 30 tablet 1  . tretinoin (RETIN-A) 0.1 % cream Apply 1 application topically at bedtime.    . valACYclovir (VALTREX) 500 MG tablet TAKE 1 TABLET BY MOUTH  DAILY 90 tablet 3  . venlafaxine XR (EFFEXOR-XR) 150 MG 24 hr capsule Take 1 capsule (150 mg total) by mouth daily with breakfast. 90 capsule 1  . vitamin E 400 UNIT capsule Take 400 Units by mouth daily.      No current facility-administered medications for this visit.    Allergies as of 05/02/2019 - Review  Complete 03/21/2019  Allergen Reaction Noted  . Flexeril [cyclobenzaprine] Other (See Comments) 12/19/2018  . Nsaids Other (See Comments) 11/01/2018    Family History  Problem Relation Age of Onset  . Other Mother        digestive type issues  . Diabetes Mother   . Hypertension Mother   . Heart attack Father   . Heart disease Father   . Early death Brother        MVA  . Uterine cancer Maternal Grandmother   . Emphysema Maternal Grandfather   . Colon polyps Maternal Grandfather   . Alcohol abuse Brother   . Colon cancer Paternal Grandfather        Diagnosed in late 52s.   . Esophageal cancer Neg Hx   . Rectal cancer Neg Hx   . Stomach cancer Neg Hx     Social History   Socioeconomic History  . Marital status: Married    Spouse name: Not on file  . Number of children: 1  . Years of education: Not on file  . Highest education level: Not on file  Occupational History  . Occupation: Best boy: [employer]  Tobacco Use  . Smoking status: Former Smoker    Years: 6.00    Types: Cigarettes    Quit date: 03/06/1993    Years since quitting: 26.1  . Smokeless tobacco: Never Used  Substance and Sexual Activity  . Alcohol use: Not Currently    Alcohol/week: 2.0 standard drinks    Types: 2 Glasses of wine per week    Comment: twice a week  . Drug use: No  . Sexual activity: Yes    Partners: Male    Birth control/protection: None, Other-see comments    Comment: husband with vasectomy  Other Topics Concern  . Not on file  Social History Narrative  . Not on file   Social Determinants of Health   Financial Resource Strain:   . Difficulty of Paying Living Expenses:   Food Insecurity:   . Worried About Charity fundraiser in the Last Year:   . [place] in the Last Year:   Transportation Needs:   . Lack of  Transportation (Medical):   [name] Kitchen Lack of Transportation (Non-Medical):   Physical Activity:   . Days of Exercise per Week:   .  Minutes of Exercise per Session:   Stress:   . Feeling of Stress :   Social Connections:   . Frequency of Communication with Friends and Family:   . Frequency of Social Gatherings with Friends and Family:   . Attends Religious Services:   . Active Member of Clubs or Organizations:   . Attends Archivist Meetings:   [name] Kitchen Marital Status:   Intimate Partner Violence:   . Fear of Current or Ex-Partner:   . Emotionally Abused:   [name] Kitchen Physically Abused:   . Sexually Abused:     Review of Systems: 12 system ROS is negative except as noted above.   Physical Exam: General:   Alert,  well-nourished, pleasant and cooperative in NAD Head:  Normocephalic and atraumatic. Eyes:  Sclera clear, no icterus.   Conjunctiva pink. Ears:  Normal auditory acuity. Nose:  No deformity, discharge,  or lesions. Mouth:  No deformity or lesions.   Neck:  Supple; no masses or thyromegaly. Lungs:  Clear throughout to auscultation.   No wheezes. Heart:  Regular rate and rhythm; no murmurs. Abdomen:  Soft,nontender, nondistended, normal bowel sounds, no rebound or guarding. No hepatosplenomegaly.   Rectal:  Deferred  Msk:  Symmetrical. No boney deformities LAD: No inguinal or umbilical LAD Extremities:  No clubbing or edema. Neurologic:  Alert and  oriented x4;  grossly nonfocal Skin:  Intact without significant lesions or rashes. Psych:  Alert and cooperative. Normal mood and affect.    Chelsea Nusz L. Tarri Glenn, MD, MPH 05/02/2019, 3:46 PM

## 2019-05-02 NOTE — Patient Instructions (Addendum)
I recommend that you resume Linzess 290 mcg daily. Please call if your constipation has not improved after 2-3 weeks of treatment.  I have recommended some labs today to follow-up on your anemia. I will let you know the results as we may be able to reduce your oral iron frequency. If you levels have not improved much, I will recommend a trial of IV iron.   Your provider has requested that you go to the basement level for lab work before leaving today. Press "B" on the elevator. The lab is located at the first door on the left as you exit the elevator.  Continue to avoid all NSAIDs. Continue to take your pantoprazole 40 mg twice daily.  Follow up appointment on 7/27/at 3:40 pm.  Thank you for trusting me with your gastrointestinal care!    Thornton Park, MD, MPH  If you are age 35 or older, your body mass index should be between 23-30. Your Body mass index is 23.21 kg/m. If this is out of the aforementioned range listed, please consider follow up with your Primary Care Provider.  If you are age 3 or younger, your body mass index should be between 19-25. Your Body mass index is 23.21 kg/m. If this is out of the aformentioned range listed, please consider follow up with your Primary Care Provider.

## 2019-05-09 ENCOUNTER — Ambulatory Visit: Payer: BC Managed Care – PPO | Attending: Internal Medicine

## 2019-05-09 DIAGNOSIS — Z23 Encounter for immunization: Secondary | ICD-10-CM

## 2019-05-09 NOTE — Progress Notes (Signed)
   Covid-19 Vaccination Clinic  Name:  Catherine Short    MRN: [id number] DOB: 1960/11/03  05/09/2019  Catherine Short was observed post Covid-19 immunization for 15 minutes without incident. She was provided with Vaccine Information Sheet and instruction to access the V-Safe system.   Catherine Short was instructed to call 911 with any severe reactions post vaccine: [name] Kitchen Difficulty breathing  . Swelling of face and throat  . A fast heartbeat  . A bad rash all over body  . Dizziness and weakness   Immunizations Administered    Name Date Dose VIS Date Route   Moderna COVID-19 Vaccine 05/09/2019  8:37 AM 0.5 mL 12/2018 Intramuscular   Manufacturer: Moderna   Lot: [id number]   [id number]

## 2019-05-15 ENCOUNTER — Other Ambulatory Visit: Payer: Self-pay | Admitting: Family Medicine

## 2019-05-15 DIAGNOSIS — F419 Anxiety disorder, unspecified: Secondary | ICD-10-CM

## 2019-05-16 NOTE — Telephone Encounter (Signed)
Catherine Short. NTBS 6 wk fu from Dec. Mail order not sent

## 2019-05-16 NOTE — Telephone Encounter (Signed)
Appt made.  Patient Aware

## 2019-05-31 ENCOUNTER — Other Ambulatory Visit: Payer: Self-pay | Admitting: Family Medicine

## 2019-05-31 DIAGNOSIS — F419 Anxiety disorder, unspecified: Secondary | ICD-10-CM

## 2019-06-07 ENCOUNTER — Encounter: Payer: Self-pay | Admitting: Family Medicine

## 2019-06-07 ENCOUNTER — Ambulatory Visit (INDEPENDENT_AMBULATORY_CARE_PROVIDER_SITE_OTHER): Payer: BC Managed Care – PPO | Admitting: Family Medicine

## 2019-06-07 ENCOUNTER — Other Ambulatory Visit: Payer: Self-pay

## 2019-06-07 VITALS — BP 111/70 | HR 67 | Temp 97.3°F | Ht 63.0 in | Wt 129.2 lb

## 2019-06-07 DIAGNOSIS — F419 Anxiety disorder, unspecified: Secondary | ICD-10-CM | POA: Diagnosis not present

## 2019-06-07 DIAGNOSIS — J302 Other seasonal allergic rhinitis: Secondary | ICD-10-CM | POA: Diagnosis not present

## 2019-06-07 DIAGNOSIS — N951 Menopausal and female climacteric states: Secondary | ICD-10-CM

## 2019-06-07 MED ORDER — VENLAFAXINE HCL ER 150 MG PO CP24: ORAL_CAPSULE | ORAL | 1 refills | Status: DC

## 2019-06-07 MED ORDER — CETIRIZINE HCL 10 MG PO TABS: 10.0000 mg | ORAL_TABLET | Freq: Every day | ORAL | 1 refills | Status: AC

## 2019-06-07 MED ORDER — FLUTICASONE PROPIONATE 50 MCG/ACT NA SUSP: 2.0000 | Freq: Every day | NASAL | 3 refills | Status: AC

## 2019-06-07 NOTE — Progress Notes (Signed)
Assessment & Plan:  1. Anxiety - Well controlled on current regimen.  - venlafaxine XR (EFFEXOR-XR) 150 MG 24 hr capsule; TAKE 1 CAPSULE BY MOUTH  DAILY WITH BREAKFAST  Dispense: 90 capsule; Refill: 1  2. Vasomotor symptoms due to menopause - Well controlled on current regimen.  - venlafaxine XR (EFFEXOR-XR) 150 MG 24 hr capsule; TAKE 1 CAPSULE BY MOUTH  DAILY WITH BREAKFAST  Dispense: 90 capsule; Refill: 1  3. Seasonal allergies - Uncontrolled. Rx'd Flonase and Zyrtec.  - fluticasone (FLONASE) 50 MCG/ACT nasal spray; Place 2 sprays into both nostrils daily.  Dispense: 32 g; Refill: 3 - cetirizine (ZYRTEC) 10 MG tablet; Take 1 tablet (10 mg total) by mouth daily.  Dispense: 90 tablet; Refill: 1   Return in about 6 months (around 12/07/2019) for follow-up of chronic medication conditions.  Hendricks Limes, MSN, APRN, FNP-C [facility]  Subjective:    Patient ID: Catherine Short, female    DOB: 10/13/1960, 59 y.o.   MRN: [id number]  Patient Care Team: Loman Brooklyn, FNP as PCP - General (Family Medicine)   Chief Complaint:  Chief Complaint  Patient presents with  . Anxiety    check up of chronic medical conditons    HPI: Catherine Short is a 59 y.o. female presenting on 06/07/2019 for Anxiety (check up of chronic medical conditons)  Patient is doing well with the Effexor.   GAD 7 : Generalized Anxiety Score 06/07/2019 01/03/2019 11/18/2018 10/07/2018  Nervous, Anxious, on Edge 1 2 1 3   Control/stop worrying 1 2 2 2   Worry too much - different things 1 1 1 3   Trouble relaxing 1 1 2 3   Restless 1 3 2 3   Easily annoyed or irritable 1 1 2 3   Afraid - awful might happen 1 1 1 2   Total GAD 7 Score 7 11 11 19   Anxiety Difficulty Not difficult at all Not difficult at all Somewhat difficult Somewhat difficult   Depression screen [facility] 2/9 06/07/2019 01/03/2019 11/18/2018  Decreased Interest 1 0 0  Down, Depressed, Hopeless 0 0 0  PHQ - 2 Score 1 0 0    Altered sleeping 1 - 0  Tired, decreased energy 1 - 0  Change in appetite 0 - 0  Feeling bad or failure about yourself  1 - 0  Trouble concentrating 0 - 0  Moving slowly or fidgety/restless 0 - 0  Suicidal thoughts 0 - 0  PHQ-9 Score 4 - 0  Difficult doing work/chores Not difficult at all - -    New complaints: Patient use to take Singulair at night which worked well for her. She feels her allergies have been worse since having COVID-19. Not taking an antihistamine or nasal spray.    Social history:  Relevant past medical, surgical, family and social history reviewed and updated as indicated. Interim medical history since our last visit reviewed.  Allergies and medications reviewed and updated.  DATA REVIEWED: CHART IN EPIC  ROS: Negative unless specifically indicated above in HPI.    Current Outpatient Medications:  .  acetaminophen (TYLENOL) 325 MG tablet, Take 2 tablets (650 mg total) by mouth every 6 (six) hours as needed for mild pain (or Fever >/= 101)., Disp: 12 tablet, Rfl: 0 .  Amino Acids (AMINO ACID PO), Take 1 tablet by mouth every morning., Disp: , Rfl:  .  ASHWAGANDHA PO, Take 1 tablet by mouth every morning. , Disp: , Rfl:  .  Cholecalciferol (VITAMIN D-3) 25  MCG (1000 UT) CAPS, Take 1,000 Units by mouth every morning. , Disp: , Rfl:  .  COLLAGEN PO, Take 1 tablet by mouth every morning. , Disp: , Rfl:  .  diclofenac Sodium (VOLTAREN) 1 % GEL, , Disp: , Rfl:  .  dicyclomine (BENTYL) 20 MG tablet, Take 20 mg by mouth 3 (three) times daily., Disp: , Rfl:  .  erythromycin ophthalmic ointment, , Disp: , Rfl:  .  GLUTATHIONE PO, Take 500 mg by mouth daily., Disp: , Rfl:  .  linaclotide (LINZESS) 290 MCG CAPS capsule, Take 1 capsule (290 mcg total) by mouth daily before breakfast., Disp: 30 capsule, Rfl: 3 .  Omega-3 Fatty Acids (FISH OIL PO), Take 1 capsule by mouth every evening. DHA and EFA-daily , Disp: , Rfl:  .  ondansetron (ZOFRAN) 4 MG tablet, Take 1 tablet (4  mg total) by mouth every 6 (six) hours as needed for nausea or vomiting., Disp: 20 tablet, Rfl: 0 .  pantoprazole (PROTONIX) 40 MG tablet, TAKE 1 TABLET BY MOUTH  TWICE DAILY BEFORE A MEAL, Disp: 180 tablet, Rfl: 3 .  PREMARIN vaginal cream, Place 1 Applicatorful vaginally at bedtime. , Disp: , Rfl:  .  tretinoin (RETIN-A) 0.1 % cream, Apply 1 application topically at bedtime., Disp: , Rfl:  .  valACYclovir (VALTREX) 500 MG tablet, TAKE 1 TABLET BY MOUTH  DAILY, Disp: 90 tablet, Rfl: 3 .  venlafaxine XR (EFFEXOR-XR) 150 MG 24 hr capsule, TAKE 1 CAPSULE BY MOUTH  DAILY WITH BREAKFAST, Disp: 90 capsule, Rfl: 1 .  vitamin E 400 UNIT capsule, Take 400 Units by mouth daily. , Disp: , Rfl:  .  cetirizine (ZYRTEC) 10 MG tablet, Take 1 tablet (10 mg total) by mouth daily., Disp: 90 tablet, Rfl: 1 .  fluticasone (FLONASE) 50 MCG/ACT nasal spray, Place 2 sprays into both nostrils daily., Disp: 32 g, Rfl: 3   Allergies  Allergen Reactions  . Flexeril [Cyclobenzaprine] Other (See Comments)    disoriented  . Nsaids Other (See Comments)    Avoid use due to gastric ulcers.   . Sulfa Antibiotics   . Xyzal [Levocetirizine] Other (See Comments)    Headache   Past Medical History:  Diagnosis Date  . Allergy   . Anemia    d/t bleeding ulcers  . Anxiety   . Arthritis   . Chronic constipation   . Colitis   . Depression   . Esophagitis   . Fibroid   . Gastritis   . GERD (gastroesophageal reflux disease)   . H/O cold sores   . H/O removal of cyst 10/2018   right arm   . Headache   . Heart murmur    "when I was 18"  . Hormone replacement therapy (HRT) 11/14/2014  . Hot flashes 11/14/2014  . Increased endometrial stripe thickness 05/30/2015   Will get biopsy  . Insomnia   . Liver cyst   . Menopause 11/14/2014  . Moody 05/23/2015  . Nerves 11/14/2014  . Night sweats 11/14/2014  . PMB (postmenopausal bleeding) 05/23/2015  . Prolactinoma ([place])   . Rectocele 05/23/2015  . Rotator cuff syndrome of left  shoulder 10/30/2010  . Stomach ulcer   . Upper GI bleed 11/01/2018   due to ulcers    Past Surgical History:  Procedure Laterality Date  . ABDOMINAL SURGERY    . APPENDECTOMY    . AUGMENTATION MAMMAPLASTY  2000   saline implants  . BIOPSY  11/01/2018   Procedure: BIOPSY;  Surgeon: Danie Binder, MD;  Location: [facility];  Service: Endoscopy;;  . BREAST ENHANCEMENT SURGERY  1996  . CARPAL TUNNEL RELEASE    . COLONOSCOPY  12/2018  . ESOPHAGOGASTRODUODENOSCOPY (EGD) WITH PROPOFOL N/A 11/01/2018   Procedure: ESOPHAGOGASTRODUODENOSCOPY (EGD) WITH PROPOFOL;  Surgeon: Danie Binder, MD;  Location: [facility];  Service: Endoscopy;  Laterality: N/A;  . LAPAROSCOPIC RIGHT HEMI COLECTOMY Right 02/27/2018   Procedure: LAPAROSCOPIC RIGHT HEMI COLECTOMY;  Surgeon: [name] Signs, MD;  Location: [facility];  Service: General;  Laterality: Right;  . LAPAROTOMY N/A 02/27/2018   Procedure: EXPLORATORY LAPAROTOMY;  Surgeon: [name] Signs, MD;  Location: [facility];  Service: General;  Laterality: N/A;  . TONSILECTOMY, ADENOIDECTOMY, BILATERAL MYRINGOTOMY AND TUBES    . UPPER GASTROINTESTINAL ENDOSCOPY      Social History   Socioeconomic History  . Marital status: Married    Spouse name: Not on file  . Number of children: 1  . Years of education: Not on file  . Highest education level: Not on file  Occupational History  . Occupation: Best boy: [employer]  Tobacco Use  . Smoking status: Former Smoker    Years: 6.00    Types: Cigarettes    Quit date: 03/06/1993    Years since quitting: 26.2  . Smokeless tobacco: Never Used  Substance and Sexual Activity  . Alcohol use: Yes    Alcohol/week: 2.0 standard drinks    Types: 2 Glasses of wine per week    Comment: twice a week wine  . Drug use: No  . Sexual activity: Yes    Partners: Male    Birth control/protection: None, Other-see comments    Comment: husband with vasectomy  Other Topics Concern  . Not on  file  Social History Narrative  . Not on file   Social Determinants of Health   Financial Resource Strain:   . Difficulty of Paying Living Expenses:   Food Insecurity:   . Worried About Charity fundraiser in the Last Year:   . Arboriculturist in the Last Year:   Transportation Needs:   . Film/video editor (Medical):   [name] Kitchen Lack of Transportation (Non-Medical):   Physical Activity:   . Days of Exercise per Week:   . Minutes of Exercise per Session:   Stress:   . Feeling of Stress :   Social Connections:   . Frequency of Communication with Friends and Family:   . Frequency of Social Gatherings with Friends and Family:   . Attends Religious Services:   . Active Member of Clubs or Organizations:   . Attends Archivist Meetings:   [name] Kitchen Marital Status:   Intimate Partner Violence:   . Fear of Current or Ex-Partner:   . Emotionally Abused:   [name] Kitchen Physically Abused:   . Sexually Abused:         Objective:    BP 111/70   Pulse 67   Temp (!) 97.3 F (36.3 C) (Temporal)   Ht 5\' 3"  (1.6 m)   Wt 129 lb 3.2 oz (58.6 kg)   LMP 07/05/2016 (Approximate) Comment: neg upt  SpO2 100%   BMI 22.89 kg/m   Wt Readings from Last 3 Encounters:  06/07/19 129 lb 3.2 oz (58.6 kg)  05/02/19 131 lb (59.4 kg)  03/21/19 130 lb (59 kg)    Physical Exam Vitals reviewed.  Constitutional:      General: She  is not in acute distress.    Appearance: Normal appearance. She is normal weight. She is not ill-appearing, toxic-appearing or diaphoretic.  HENT:     Head: Normocephalic and atraumatic.  Eyes:     General: No scleral icterus.       Right eye: No discharge.        Left eye: No discharge.     Conjunctiva/sclera: Conjunctivae normal.  Cardiovascular:     Rate and Rhythm: Normal rate.  Pulmonary:     Effort: Pulmonary effort is normal. No respiratory distress.  Musculoskeletal:        General: Normal range of motion.     Cervical back: Normal range of motion.  Skin:     General: Skin is warm and dry.     Capillary Refill: Capillary refill takes less than 2 seconds.  Neurological:     General: No focal deficit present.     Mental Status: She is alert and oriented to person, place, and time. Mental status is at baseline.  Psychiatric:        Mood and Affect: Mood normal.        Behavior: Behavior normal.        Thought Content: Thought content normal.        Judgment: Judgment normal.     Lab Results  Component Value Date   TSH 1.660 08/19/2016   Lab Results  Component Value Date   WBC 10.2 05/02/2019   HGB 13.1 05/02/2019   HCT 38.6 05/02/2019   MCV 94.9 05/02/2019   PLT 180.0 05/02/2019   Lab Results  Component Value Date   NA 143 11/21/2018   K 4.4 11/21/2018   CO2 26 11/21/2018   GLUCOSE 91 11/21/2018   BUN 16 11/21/2018   CREATININE 0.83 11/21/2018   BILITOT 0.8 10/31/2018   ALKPHOS 35 (L) 10/31/2018   AST 23 10/31/2018   ALT 24 10/31/2018   PROT 5.8 (L) 10/31/2018   ALBUMIN 3.5 10/31/2018   CALCIUM 9.0 11/21/2018   ANIONGAP 5 11/01/2018   Lab Results  Component Value Date   CHOL 163 11/21/2018   Lab Results  Component Value Date   HDL 81 11/21/2018   Lab Results  Component Value Date   LDLCALC 69 11/21/2018   Lab Results  Component Value Date   TRIG 69 11/21/2018   Lab Results  Component Value Date   CHOLHDL 2.0 11/21/2018   Lab Results  Component Value Date   HGBA1C 5.4 05/23/2015

## 2019-07-18 ENCOUNTER — Telehealth: Payer: Self-pay | Admitting: Family Medicine

## 2019-07-18 NOTE — Telephone Encounter (Signed)
  Incoming Patient Call  07/18/2019  What symptoms do you have? Poison oak and it is spreading   How long have you been sick? Started about two days ago  Have you been seen for this problem? No, would like medication to stop spread  If your provider decides to give you a prescription, which pharmacy would you like for it to be sent to? [facility] [place]    Patient informed that this information will be sent to the clinical staff for review and that they should receive a follow up call.

## 2019-07-18 NOTE — Telephone Encounter (Signed)
Spoke with pt who states she has gotten into poison oak and it is spreading up her legs and she is concerned it may end up in her genital region. She has tried calamine lotion and other otc creams that isn't helping. Advised pt she would ntbs for evaluation for prescribed medications and offered appt for tomorrow with [facility] but pt declined stating she works and can't afford it.   Can we send in something for her or does she ntbs?

## 2019-07-18 NOTE — Telephone Encounter (Signed)
Pt returned missed call from [facility]. Reviewed Britneys note with pt regarding poison oak outbreak. Pt says she would try doing OTC methods that Gans recommended and if she isnt any better tomorrow she will give our office a call back to make an appt.

## 2019-07-18 NOTE — Telephone Encounter (Signed)
Since she works, can she do a telephone visit? After hours clinic? Poison Oak rash will only appear in areas that were touching with the oils on the leaves. It cannot keep spreading although it seems like it because different areas show the rash at different times. OTC treatment if she cannot be seen includes the calamine lotion and hydrocortisone cream. Cools packs relieve itching. Zyrtec, Xyzal, or Benadryl can help with itching as well. The Zyrtec or Xyzal lasts longer than Benadyl and won't make her as sleepy, although I do still recommend taking it at night.

## 2019-07-19 ENCOUNTER — Encounter: Payer: Self-pay | Admitting: Family Medicine

## 2019-07-19 ENCOUNTER — Ambulatory Visit (INDEPENDENT_AMBULATORY_CARE_PROVIDER_SITE_OTHER): Payer: BC Managed Care – PPO | Admitting: Family Medicine

## 2019-07-19 ENCOUNTER — Other Ambulatory Visit: Payer: Self-pay

## 2019-07-19 DIAGNOSIS — L255 Unspecified contact dermatitis due to plants, except food: Secondary | ICD-10-CM

## 2019-07-19 MED ORDER — TRIAMCINOLONE ACETONIDE 0.1 % EX CREA: 1.0000 "application " | TOPICAL_CREAM | Freq: Two times a day (BID) | CUTANEOUS | 0 refills | Status: DC

## 2019-07-19 MED ORDER — PREDNISONE 10 MG (21) PO TBPK: ORAL_TABLET | ORAL | 0 refills | Status: DC

## 2019-07-19 NOTE — Progress Notes (Signed)
Virtual Visit via Telephone Note  I connected with Catherine Short on 07/19/19 at 1:55 PM by telephone and verified that I am speaking with the correct person using two identifiers. Catherine Short is currently located at work and 3 co-workers are currently with her during this visit. The provider, Loman Brooklyn, FNP is located in their office at time of visit.  I discussed the limitations, risks, security and privacy concerns of performing an evaluation and management service by telephone and the availability of in person appointments. I also discussed with the patient that there may be a patient responsible charge related to this service. The patient expressed understanding and agreed to proceed.  Subjective: PCP: Loman Brooklyn, FNP  Chief Complaint  Patient presents with  . Rash   Patient has got into some poison oak or ivy 3 days ago which has resulted in a rash on both lower extremities.  She reports it covers probably 30% of her legs.  She has been applying calamine lotion that she does not feel is helping.  She describes the rash as very itchy and blistery.   ROS: Per HPI  Current Outpatient Medications:  .  acetaminophen (TYLENOL) 325 MG tablet, Take 2 tablets (650 mg total) by mouth every 6 (six) hours as needed for mild pain (or Fever >/= 101)., Disp: 12 tablet, Rfl: 0 .  Amino Acids (AMINO ACID PO), Take 1 tablet by mouth every morning., Disp: , Rfl:  .  ASHWAGANDHA PO, Take 1 tablet by mouth every morning. , Disp: , Rfl:  .  cetirizine (ZYRTEC) 10 MG tablet, Take 1 tablet (10 mg total) by mouth daily., Disp: 90 tablet, Rfl: 1 .  Cholecalciferol (VITAMIN D-3) 25 MCG (1000 UT) CAPS, Take 1,000 Units by mouth every morning. , Disp: , Rfl:  .  COLLAGEN PO, Take 1 tablet by mouth every morning. , Disp: , Rfl:  .  diclofenac Sodium (VOLTAREN) 1 % GEL, , Disp: , Rfl:  .  dicyclomine (BENTYL) 20 MG tablet, Take 20 mg by mouth 3 (three) times daily., Disp: , Rfl:  .   erythromycin ophthalmic ointment, , Disp: , Rfl:  .  fluticasone (FLONASE) 50 MCG/ACT nasal spray, Place 2 sprays into both nostrils daily., Disp: 32 g, Rfl: 3 .  GLUTATHIONE PO, Take 500 mg by mouth daily., Disp: , Rfl:  .  linaclotide (LINZESS) 290 MCG CAPS capsule, Take 1 capsule (290 mcg total) by mouth daily before breakfast., Disp: 30 capsule, Rfl: 3 .  Omega-3 Fatty Acids (FISH OIL PO), Take 1 capsule by mouth every evening. DHA and EFA-daily , Disp: , Rfl:  .  ondansetron (ZOFRAN) 4 MG tablet, Take 1 tablet (4 mg total) by mouth every 6 (six) hours as needed for nausea or vomiting., Disp: 20 tablet, Rfl: 0 .  pantoprazole (PROTONIX) 40 MG tablet, TAKE 1 TABLET BY MOUTH  TWICE DAILY BEFORE A MEAL, Disp: 180 tablet, Rfl: 3 .  PREMARIN vaginal cream, Place 1 Applicatorful vaginally at bedtime. , Disp: , Rfl:  .  tretinoin (RETIN-A) 0.1 % cream, Apply 1 application topically at bedtime., Disp: , Rfl:  .  valACYclovir (VALTREX) 500 MG tablet, TAKE 1 TABLET BY MOUTH  DAILY, Disp: 90 tablet, Rfl: 3 .  venlafaxine XR (EFFEXOR-XR) 150 MG 24 hr capsule, TAKE 1 CAPSULE BY MOUTH  DAILY WITH BREAKFAST, Disp: 90 capsule, Rfl: 1 .  vitamin E 400 UNIT capsule, Take 400 Units by mouth daily. , Disp: , Rfl:  Allergies  Allergen Reactions  . Flexeril [Cyclobenzaprine] Other (See Comments)    disoriented  . Nsaids Other (See Comments)    Avoid use due to gastric ulcers.   . Sulfa Antibiotics   . Xyzal [Levocetirizine] Other (See Comments)    Headache   Past Medical History:  Diagnosis Date  . Allergy   . Anemia    d/t bleeding ulcers  . Anxiety   . Arthritis   . Chronic constipation   . Colitis   . Depression   . Esophagitis   . Fibroid   . Gastritis   . GERD (gastroesophageal reflux disease)   . H/O cold sores   . H/O removal of cyst 10/2018   right arm   . Headache   . Heart murmur    "when I was 18"  . Hormone replacement therapy (HRT) 11/14/2014  . Hot flashes 11/14/2014  .  Increased endometrial stripe thickness 05/30/2015   Will get biopsy  . Insomnia   . Liver cyst   . Menopause 11/14/2014  . Moody 05/23/2015  . Nerves 11/14/2014  . Night sweats 11/14/2014  . PMB (postmenopausal bleeding) 05/23/2015  . Prolactinoma ([place])   . Rectocele 05/23/2015  . Rotator cuff syndrome of left shoulder 10/30/2010  . Stomach ulcer   . Upper GI bleed 11/01/2018   due to ulcers    Observations/Objective: A&O  No respiratory distress or wheezing audible over the phone Mood, judgement, and thought processes all WNL   Assessment and Plan: 1. Rhus dermatitis - Cool compresses, antihistamine, calamine lotion, triamcinolone cream, and steroids. - predniSONE (STERAPRED UNI-PAK 21 TAB) 10 MG (21) TBPK tablet; As directed x 6 days  Dispense: 21 tablet; Refill: 0 - triamcinolone cream (KENALOG) 0.1 %; Apply 1 application topically 2 (two) times daily.  Dispense: 80 g; Refill: 0   Follow Up Instructions:  I discussed the assessment and treatment plan with the patient. The patient was provided an opportunity to ask questions and all were answered. The patient agreed with the plan and demonstrated an understanding of the instructions.   The patient was advised to call back or seek an in-person evaluation if the symptoms worsen or if the condition fails to improve as anticipated.  The above assessment and management plan was discussed with the patient. The patient verbalized understanding of and has agreed to the management plan. Patient is aware to call the clinic if symptoms persist or worsen. Patient is aware when to return to the clinic for a follow-up visit. Patient educated on when it is appropriate to go to the emergency department.   Time call ended: 2:00 PM  I provided 7 minutes of non-face-to-face time during this encounter.  Hendricks Limes, MSN, APRN, FNP-C [facility] 07/19/19

## 2019-07-21 ENCOUNTER — Encounter: Payer: Self-pay | Admitting: Family Medicine

## 2019-08-01 ENCOUNTER — Ambulatory Visit: Payer: BC Managed Care – PPO | Admitting: Gastroenterology

## 2019-08-03 ENCOUNTER — Ambulatory Visit: Payer: BC Managed Care – PPO | Admitting: Nurse Practitioner

## 2019-10-25 ENCOUNTER — Telehealth: Payer: Self-pay

## 2019-10-25 NOTE — Telephone Encounter (Signed)
PA for Linzess 290 mcg and Pantoprazole 40 mg sent via CoverMyMeds.  ID [id number], Kara Dies: [id number] Grp: [id number].  Preferred [facility]

## 2019-11-07 ENCOUNTER — Encounter: Payer: Self-pay | Admitting: Gastroenterology

## 2019-11-07 ENCOUNTER — Telehealth: Payer: Self-pay

## 2019-11-07 ENCOUNTER — Ambulatory Visit (INDEPENDENT_AMBULATORY_CARE_PROVIDER_SITE_OTHER): Payer: BC Managed Care – PPO | Admitting: Gastroenterology

## 2019-11-07 VITALS — BP 102/72 | HR 62 | Ht 63.0 in | Wt 135.0 lb

## 2019-11-07 DIAGNOSIS — K5900 Constipation, unspecified: Secondary | ICD-10-CM | POA: Diagnosis not present

## 2019-11-07 DIAGNOSIS — D509 Iron deficiency anemia, unspecified: Secondary | ICD-10-CM

## 2019-11-07 DIAGNOSIS — R112 Nausea with vomiting, unspecified: Secondary | ICD-10-CM

## 2019-11-07 MED ORDER — ALIGN PO CAPS: 1.0000 | ORAL_CAPSULE | Freq: Every day | ORAL | 0 refills | Status: DC

## 2019-11-07 MED ORDER — MOTEGRITY 2 MG PO TABS: 2.0000 mg | ORAL_TABLET | Freq: Every day | ORAL | 2 refills | Status: DC

## 2019-11-07 MED ORDER — OMEPRAZOLE 40 MG PO CPDR
40.0000 mg | DELAYED_RELEASE_CAPSULE | Freq: Two times a day (BID) | ORAL | 2 refills | Status: DC
Start: 2019-11-07 — End: 2019-12-12

## 2019-11-07 MED ORDER — MOTEGRITY 2 MG PO TABS: 2.0000 mg | ORAL_TABLET | Freq: Two times a day (BID) | ORAL | 2 refills | Status: DC

## 2019-11-07 MED ORDER — FDGARD 25-20.75 MG PO CAPS: ORAL_CAPSULE | ORAL | 0 refills | Status: DC

## 2019-11-07 NOTE — Patient Instructions (Addendum)
I wish that the Linzess was improving your symptoms more than it is. We will try to get Motegrity 2 mg daily approved by your insurance.   PRESCRIPTION MEDICATION(S): We have sent the following medication(s) to your pharmacy:  . Motegrity 32m   We will submit for prior authorization with your insurance company and notify you with their response.  I am recommending a trial of FDGard - 2 capsules taken twice daily for 4 weeks.  Please take FDGard 30 to 60 minutes before meals with water.  There is no distinct pattern of side effects with FDGard, but, sometimes patients experience a mild tingling sensation in the gut within the first 30 minutes. This sensation should subsequently subside.   SAMPLES: We have given you samples of the following medication to take:  . FD GARD  Let's see if switching from pantoprazole to omeprazole provides any additional relief.   PRESCRIPTION MEDICATION(S): We have sent the following medication(s) to your pharmacy:  . Omeprazole 432mplease take twice daily  Taking a daily probiotic such as Align may also provide some relief.   OVER THE COUNTER MEDICATION(S): Please purchase the following medications over the counter and take as directed:  . [name] Kitchenlign  If you are age 2628r younger, your body mass index should be between 19-25. Your Body mass index is 23.91 kg/m. If this is out of the aformentioned range listed, please consider follow up with your Primary Care Provider.   Findings ways to help with the stress may make a big difference in your gut health.  Medication, exercise including yoga and tai chi, deep breathing, and even acupuncture are good options.   Please follow up with me in 3-4 months  Thank you for trusting me with your gastrointestinal care!    KiThornton ParkMD, MPH

## 2019-11-07 NOTE — Addendum Note (Signed)
Addended by: Hardie Pulley, Kiesha Ensey J on: 11/07/2019 03:54 PM   Modules accepted: Orders

## 2019-11-07 NOTE — Progress Notes (Signed)
PA for Motegrity denied d/t quantity limit exception. However, Motegrity approved for use ONCE PER DAY. Approval date 11/07/19 through 11/06/20. Per Dr. Tarri Glenn orders, will resend Rx for QD daosing.

## 2019-11-07 NOTE — Telephone Encounter (Signed)
PRIOR AUTHORIZATION  PA initiation date: 11/07/19  Medication: Motegrity 2mg  Insurance Company: [employer] Submission completed electronically through [employer] My Meds: Yes  Will await insurance response re: approval/denial.  Catherine Short (Key: [id number])  Your information has been sent to [employer]. Catherine Short (Key: [id number])  [employer] is reviewing your PA request. Typically an electronic response will be received within 72 hours. To check for an update later, open this request from your dashboard.  You may close this dialog and return to your dashboard to perform other tasks.

## 2019-11-07 NOTE — Telephone Encounter (Signed)
DENIAL  Medication: Motegrity 72m Insurance Company: [employer] PA response: DENIED Rationale: Step therapy has been met and can approve Motegrity 234mto use ONCE PER DAY through 11/06/20. However, we have denied this PA due to quantity limit exception.  Document has been given to Dr. BeTarri Glennor review. Will await response.

## 2019-11-07 NOTE — Progress Notes (Signed)
Referring Provider: Loman Brooklyn, FNP Primary Care Physician:  Loman Brooklyn, FNP  Chief complaint:  constipation  IMPRESSION:  Chronic constipation    - No response to Miralax daily, Citrucel, magnesium oxide, swiss chris, Amitiza Chronic nausea - may be related to constipation NSAID related gastric ulcers, resolved on follow-up EGD History of iron deficiency anemia     - labs 4/21 were normal Internal hemorrhoids History of cecal volvulus status post right hemicolectomy 02/2018 partial small bowel obstruction due to adhesions, hospitalized at [name] 08/2018 Remote diagnosis of microscopic colitis in 2011 No polyps on colonoscopy 12/19/2018    - colonoscopy recommended again in 10 years   Chronic constipation not responding to Miralax daily, Citrucel, magnesium oxide, swiss chris, Amitiz, or Linzess to 290 mcg daily. Recommend Motegrity 2 mg daily.  Nausea: Temporally associated with progressive constipation. May reflect diffuse GI dysmotility disorder. Motegrity may provide relief. If unable to get this approved, will proceed with trial of FDGard. She asked about switching PPIs with the hope that something other than pantoprazole may provide additional relief.  She will also start taking a daily probiotic.   H pylori negative gastritis, reflux, and PUD: Clinical improvement noted on last EGD. Continue pantoprazole 40 mg BID. Avoid all NSAIDs. She does not feel her nausea is related to recurrent PUD.   History of iron deficiency anemia: Likely due to history of NSAID-related PUD and gastritis. Labs in April documented resolution of anemia.     PLAN: - Continue Linzess 290 mcg daily - Start Motegrity 2 mg daily (will attempt to get this approved) - Continue to avoid all NSAIDs - Switch pantoprazole to omeprazole 40 mg BID (or other PPI as preferred by her insurance) - Add a a daily probiotic - Samples of FDGard - Follow-up in 3-4 months, earlier if needed  Please  see the "Patient Instructions" section for addition details about the plan.  HPI: Catherine Short is a 59 y.o. female who returns in scheduled follow-up. Former Dr. Deatra Ina patient.  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 History of microscopic colitis diagnosed in 2011 but had not been on any treatment for that.   Colonoscopy in 2010 that was normal except for internal hemorrhoids  Cecal volvulus requiring right hemicolectomy in 02/2018 at [name].    Seen in consultation 10/24/18 [name] for chronic constipation after a hospitalization at [name] for partial small bowel obstruction. Used Amitiza and subsequently Linzess in 2015 until they stopped working.  She was given Amitiza 24 twice daily cannot remember now whether or not that helped or not. Had a colonoscopy in 2010 that was normal except for internal hemorrhoids.  [name] Started her on Linzess 145 mcg daily in addition to encouragement to drink at least 64 ounces of water daily and follow a high fiber diet.   Subsequently evaluated by Dr. Oneida Alar 11/01/18 when EGD revealed NSAID induced gastric ulcers.  Upper endoscopy 12/19/2018 showed healing gastric ulcers in the prepyloric region as well as 2 nonbleeding cratered gastric ulcers.  Colonoscopy 12/19/2018 showed end-to-end ileocolonic anastomosis and was otherwise normal.  Repeat colonoscopy recommended in 10 years  Follow-up EGD 03/21/19 showed evidence for healed gastric ulcer with residual deformity in the prepyloric region.  Was asked to continue pantoprazole 40 mg twice daily.  Gastric biopsies have repeatedly been negative for [name] and intestinal metaplasia. Esophageal biopsies showed reflux esophagitis.  Seen in 04/2019 for constipation and her Linzess was increased to 290 mcg  daily. No response to Miralax daily, Citrucel, magnesium oxide, swiss chris.  Some nausea when she gets hungry. Ongoing bloating and eructation, worsened by eating.  No associated abdominal pain.   Constipated worsened by iron supplements that she has been taking sine October.   Returns today in scheduled follow-up.  Primary complaint today is nausea. Symptoms develop 2-3 hours after eating breakfast. Seems to occur if she goes an extended period of time between eating. Improves if she eats. Associated bloating. Improved when constipation is under better control.   Constipation improved after stopping the iron supplements. Using Linzess nearly PRN to insure one formed BM daily. No associated straining. Intermittent sense of incomplete evacuation.   Iron supplements stopped in April after labs showed:  Labs 05/02/2019: Hemoglobin 13.1, platelets 180, MCV 94.9, RDW 16, iron 143, ferritin 44, transferrin 268, saturation ratio 38  Past Medical History:  Diagnosis Date  . Allergy   . Anemia    d/t bleeding ulcers  . Anxiety   . Arthritis   . Chronic constipation   . Colitis   . Depression   . Esophagitis   . Fibroid   . Gastritis   . GERD (gastroesophageal reflux disease)   . H/O cold sores   . H/O removal of cyst 10/2018   right arm   . Headache   . Heart murmur    "when I was 18"  . Hormone replacement therapy (HRT) 11/14/2014  . Hot flashes 11/14/2014  . Increased endometrial stripe thickness 05/30/2015   Will get biopsy  . Insomnia   . Liver cyst   . Menopause 11/14/2014  . [place] 05/23/2015  . Nerves 11/14/2014  . Night sweats 11/14/2014  . PMB (postmenopausal bleeding) 05/23/2015  . Prolactinoma ([place])   . Rectocele 05/23/2015  . Rotator cuff syndrome of left shoulder 10/30/2010  . Stomach ulcer   . Upper GI bleed 11/01/2018   due to ulcers    Past Surgical History:  Procedure Laterality Date  . ABDOMINAL SURGERY    . APPENDECTOMY    . AUGMENTATION MAMMAPLASTY  2000   saline implants  . BIOPSY  11/01/2018   Procedure: BIOPSY;  Surgeon: Danie Binder, MD;  Location: [facility];  Service: Endoscopy;;  . [place]  . CARPAL TUNNEL RELEASE     . COLONOSCOPY  12/2018  . ESOPHAGOGASTRODUODENOSCOPY (EGD) WITH PROPOFOL N/A 11/01/2018   Procedure: ESOPHAGOGASTRODUODENOSCOPY (EGD) WITH PROPOFOL;  Surgeon: Danie Binder, MD;  Location: [facility];  Service: Endoscopy;  Laterality: N/A;  . LAPAROSCOPIC RIGHT HEMI COLECTOMY Right 02/27/2018   Procedure: LAPAROSCOPIC RIGHT HEMI COLECTOMY;  Surgeon: [name] Signs, MD;  Location: [facility] ORS;  Service: General;  Laterality: Right;  . LAPAROTOMY N/A 02/27/2018   Procedure: EXPLORATORY LAPAROTOMY;  Surgeon: [name] Signs, MD;  Location: [facility] ORS;  Service: General;  Laterality: N/A;  . TONSILECTOMY, ADENOIDECTOMY, BILATERAL MYRINGOTOMY AND TUBES    . UPPER GASTROINTESTINAL ENDOSCOPY      Current Outpatient Medications  Medication Sig Dispense Refill  . acetaminophen (TYLENOL) 325 MG tablet Take 2 tablets (650 mg total) by mouth every 6 (six) hours as needed for mild pain (or Fever >/= 101). 12 tablet 0  . Amino Acids (AMINO ACID PO) Take 1 tablet by mouth every morning.    . ASHWAGANDHA PO Take 1 tablet by mouth every morning.     . cetirizine (ZYRTEC) 10 MG tablet Take 1 tablet (10 mg total) by mouth daily. 90 tablet 1  .  Cholecalciferol (VITAMIN D-3) 25 MCG (1000 UT) CAPS Take 1,000 Units by mouth every morning.     . COLLAGEN PO Take 1 tablet by mouth every morning.     . diclofenac Sodium (VOLTAREN) 1 % GEL     . dicyclomine (BENTYL) 20 MG tablet Take 20 mg by mouth 3 (three) times daily.    [name] Kitchen erythromycin ophthalmic ointment     . fluticasone (FLONASE) 50 MCG/ACT nasal spray Place 2 sprays into both nostrils daily. 32 g 3  . GLUTATHIONE PO Take 500 mg by mouth daily.    [name] Kitchen linaclotide (LINZESS) 290 MCG CAPS capsule Take 1 capsule (290 mcg total) by mouth daily before breakfast. 30 capsule 3  . Omega-3 Fatty Acids (FISH OIL PO) Take 1 capsule by mouth every evening. DHA and EFA-daily     . ondansetron (ZOFRAN) 4 MG tablet Take 1 tablet (4 mg total) by mouth every 6 (six) hours as needed for  nausea or vomiting. 20 tablet 0  . pantoprazole (PROTONIX) 40 MG tablet TAKE 1 TABLET BY MOUTH  TWICE DAILY BEFORE A MEAL 180 tablet 3  . PREMARIN vaginal cream Place 1 Applicatorful vaginally at bedtime.     . tretinoin (RETIN-A) 0.1 % cream Apply 1 application topically at bedtime.    . valACYclovir (VALTREX) 500 MG tablet TAKE 1 TABLET BY MOUTH  DAILY 90 tablet 3  . venlafaxine XR (EFFEXOR-XR) 150 MG 24 hr capsule TAKE 1 CAPSULE BY MOUTH  DAILY WITH BREAKFAST 90 capsule 1  . vitamin E 400 UNIT capsule Take 400 Units by mouth daily.      No current facility-administered medications for this visit.    Allergies as of 11/07/2019 - Review Complete 11/07/2019  Allergen Reaction Noted  . Flexeril [cyclobenzaprine] Other (See Comments) 12/19/2018  . Nsaids Other (See Comments) 11/01/2018  . Sulfa antibiotics  06/07/2019  . Xyzal [levocetirizine] Other (See Comments) 06/07/2019    Family History  Problem Relation Age of Onset  . Other Mother        digestive type issues  . Diabetes Mother   . Hypertension Mother   . Heart attack Father   . Heart disease Father   . Early death Brother        MVA  . Uterine cancer Maternal Grandmother   . Emphysema Maternal Grandfather   . Colon polyps Maternal Grandfather   . Alcohol abuse Brother   . Colon cancer Paternal Grandfather        Diagnosed in late 60s.   . Esophageal cancer Neg Hx   . Rectal cancer Neg Hx   . Stomach cancer Neg Hx     Social History   Socioeconomic History  . Marital status: Married    Spouse name: Not on file  . Number of children: 1  . Years of education: Not on file  . Highest education level: Not on file  Occupational History  . Occupation: Best boy: [employer]  Tobacco Use  . Smoking status: Former Smoker    Years: 6.00    Types: Cigarettes    Quit date: 03/06/1993    Years since quitting: 26.6  . Smokeless tobacco: Never Used  Vaping Use  . Vaping Use: Never used   Substance and Sexual Activity  . Alcohol use: Yes    Alcohol/week: 2.0 standard drinks    Types: 2 Glasses of wine per week    Comment: twice a week wine  .  Drug use: No  . Sexual activity: Yes    Partners: Male    Birth control/protection: None, Other-see comments    Comment: husband with vasectomy  Other Topics Concern  . Not on file  Social History Narrative  . Not on file   Social Determinants of Health   Financial Resource Strain:   . Difficulty of Paying Living Expenses: Not on file  Food Insecurity:   . Worried About Charity fundraiser in the Last Year: Not on file  . Ran Out of Food in the Last Year: Not on file  Transportation Needs:   . Lack of Transportation (Medical): Not on file  . Lack of Transportation (Non-Medical): Not on file  Physical Activity:   . Days of Exercise per Week: Not on file  . Minutes of Exercise per Session: Not on file  Stress:   . Feeling of Stress : Not on file  Social Connections:   . Frequency of Communication with Friends and Family: Not on file  . Frequency of Social Gatherings with Friends and Family: Not on file  . Attends Religious Services: Not on file  . Active Member of Clubs or Organizations: Not on file  . Attends Archivist Meetings: Not on file  . Marital Status: Not on file  Intimate Partner Violence:   . Fear of Current or Ex-Partner: Not on file  . Emotionally Abused: Not on file  . Physically Abused: Not on file  . Sexually Abused: Not on file    Physical Exam: General:   Alert,  well-nourished, pleasant and cooperative in NAD Head:  Normocephalic and atraumatic. Eyes:  Sclera clear, no icterus.   Conjunctiva pink. Abdomen:  Soft, nontender, nondistended, normal bowel sounds, no rebound or guarding. No hepatosplenomegaly.  Well-healed surgical scar with an associated ventral hernia.  Neurologic:  Alert and  oriented x4;  grossly nonfocal Skin:  Intact without significant lesions or rashes. Psych:   Alert and cooperative. Normal mood and affect.    Mandalyn Pasqua L. Tarri Glenn, MD, MPH 11/07/2019, 10:01 AM

## 2019-11-08 NOTE — Telephone Encounter (Signed)
Dr. Tarri Glenn reviewed PA denial letter for BID dosing and APPROVAL for QD dosing. Rx has been changed per her request to QD dosing and re-sent to pharmacy along indication about approval.

## 2019-11-22 IMAGING — CT CT ABDOMEN AND PELVIS WITHOUT CONTRAST
2 of 4 series · 16 of 46 positions shown, 18 images · non-contrast
Comparison: 09/02/2018

CLINICAL DATA: Abdominal pain, nausea, and diarrhea beginning last
night. Small bowel obstruction. Previous right hemicolectomy.

EXAM:
CT ABDOMEN AND PELVIS WITHOUT CONTRAST
TECHNIQUE: Multidetector CT imaging of the abdomen and pelvis was performed
following the standard protocol without IV contrast.

[Series 2: axial st · axial · 0.81mm/px · z∈[-1016,-606]mm · 13 of 94 slices shown, 15 images]
[im 6/94  soft-tissue]
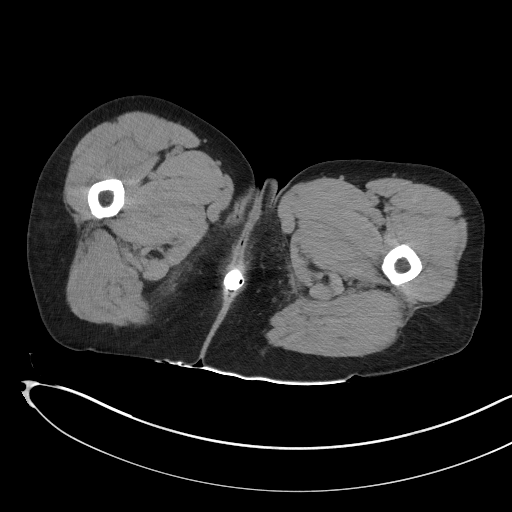
[im 6/94  bone]
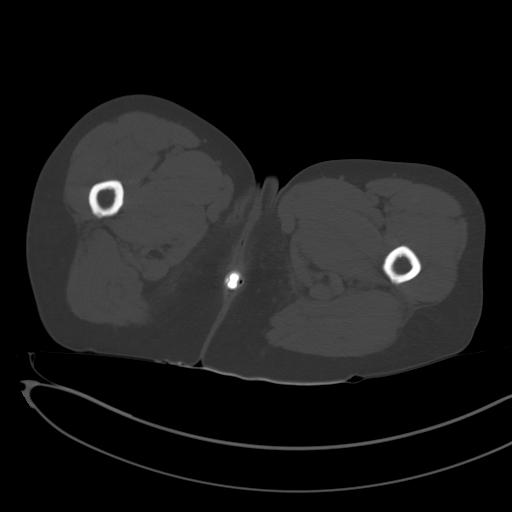
[im 11/94  soft-tissue]
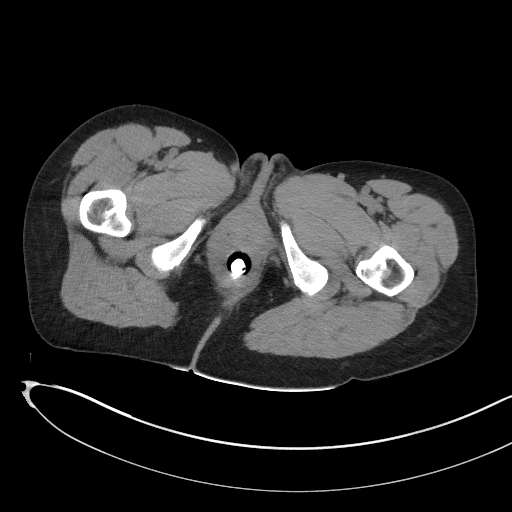
[im 21/94  soft-tissue]
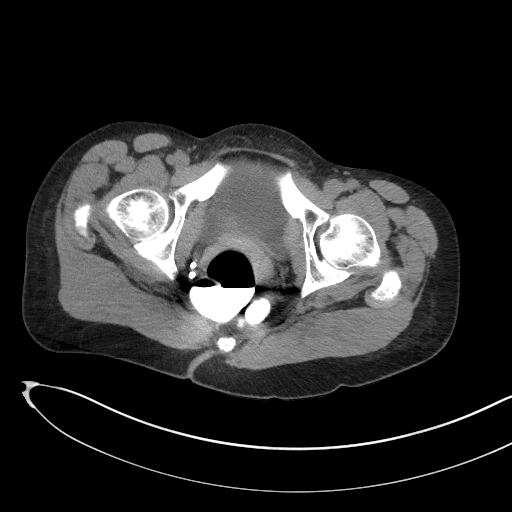
[im 26/94  soft-tissue]
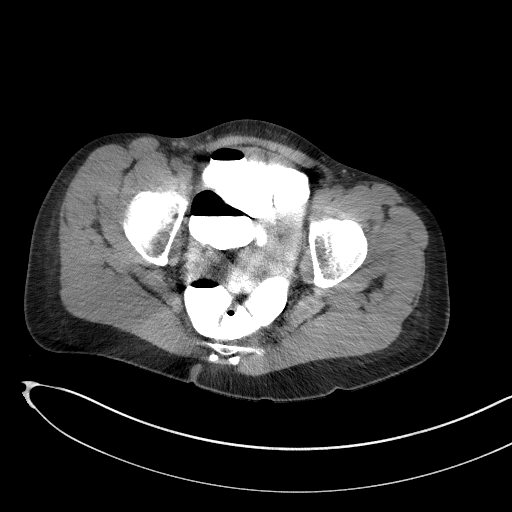
[im 32/94  soft-tissue]
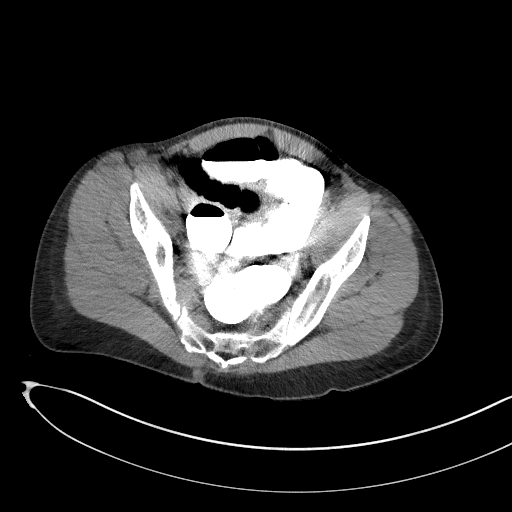
[im 42/94  soft-tissue]
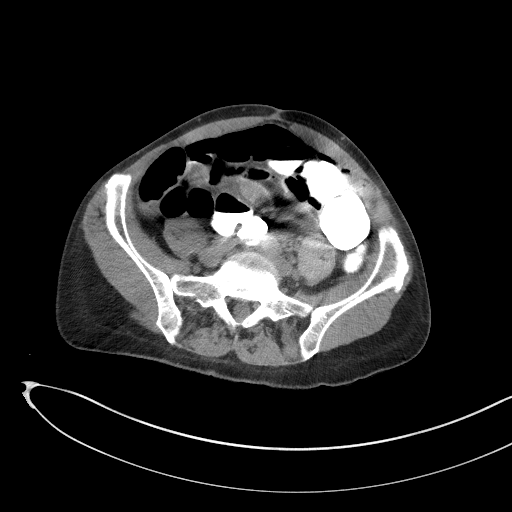
[im 47/94  soft-tissue]
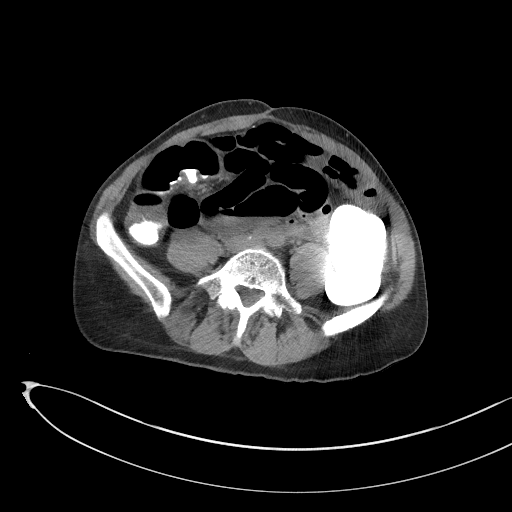
[im 52/94  soft-tissue]
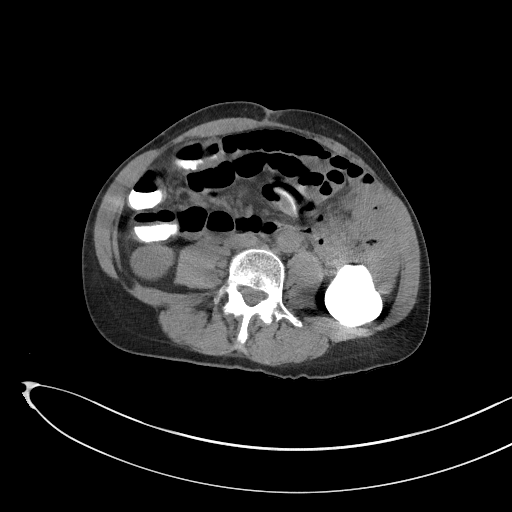
[im 63/94  soft-tissue]
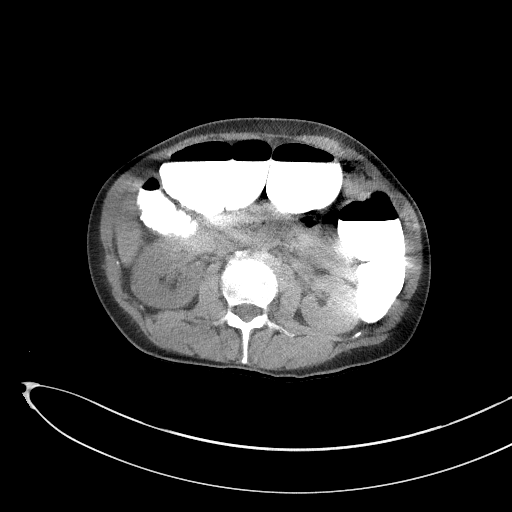
[im 63/94  bone]
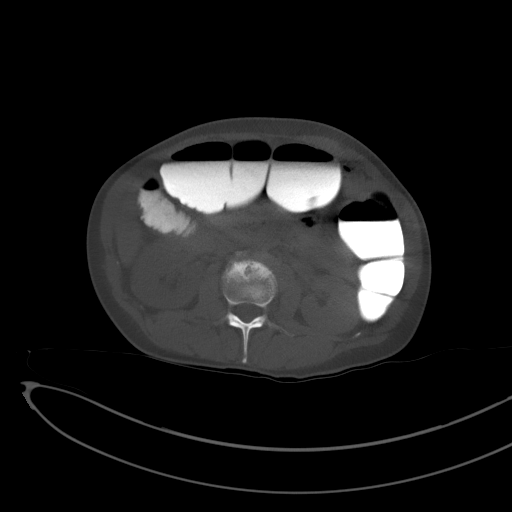
[im 68/94  soft-tissue]
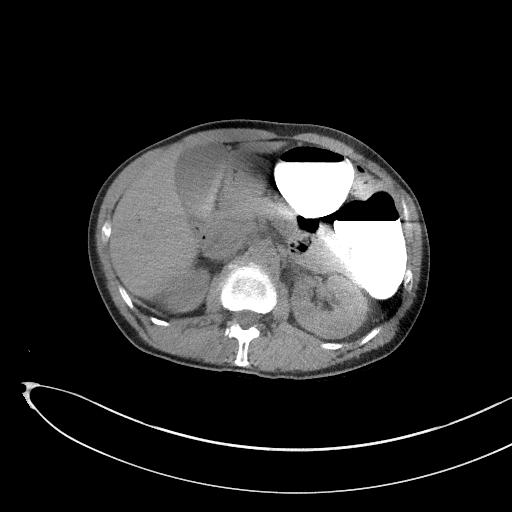
[im 73/94  soft-tissue]
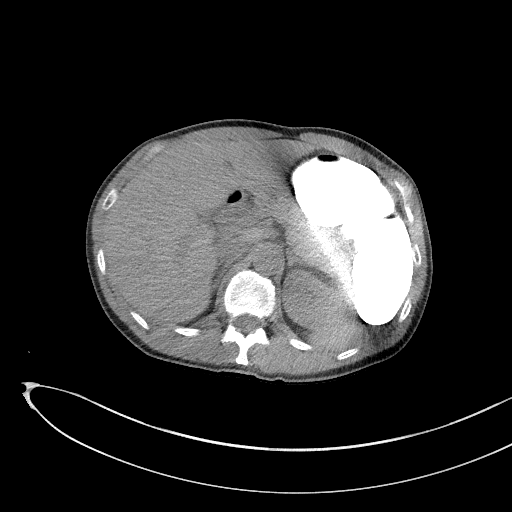
[im 83/94  soft-tissue]
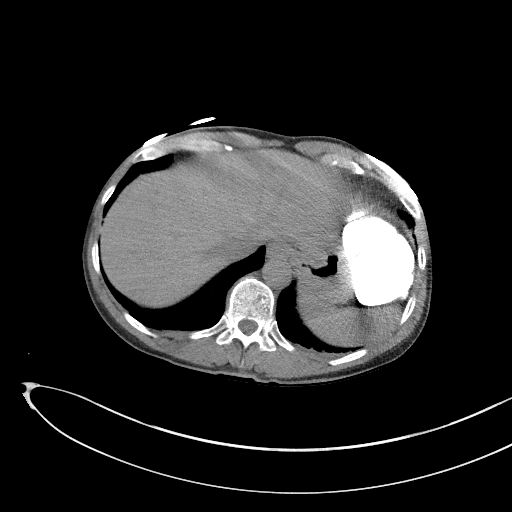
[im 88/94  soft-tissue]
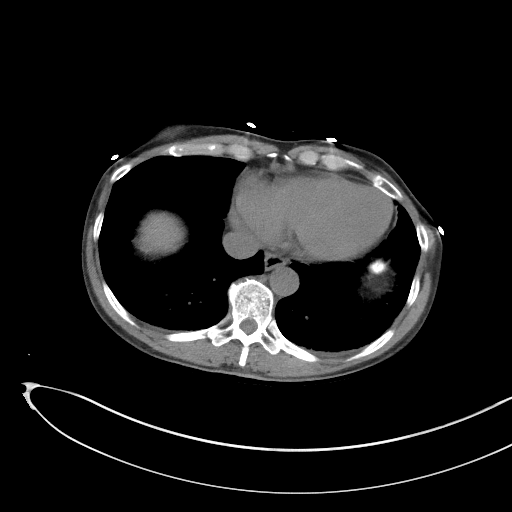

[Series 5: coronal st · coronal · 0.81mm/px · 3 of 95 slices shown]
[im 32/95  soft-tissue]
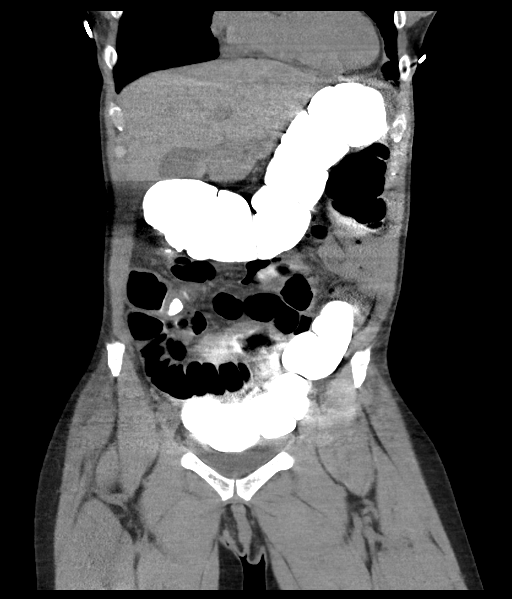
[im 42/95  soft-tissue]
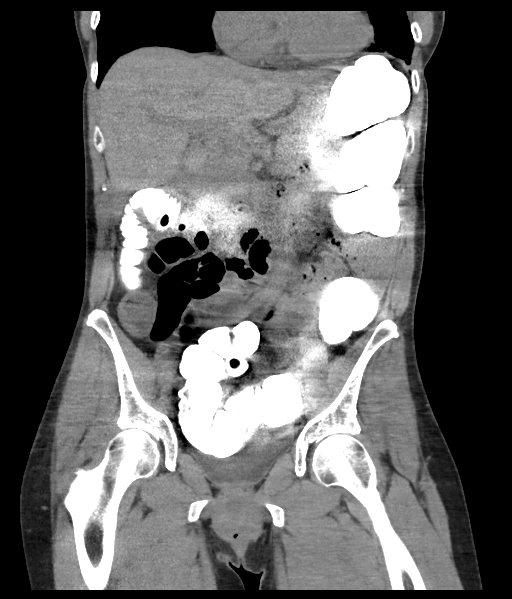
[im 53/95  soft-tissue]
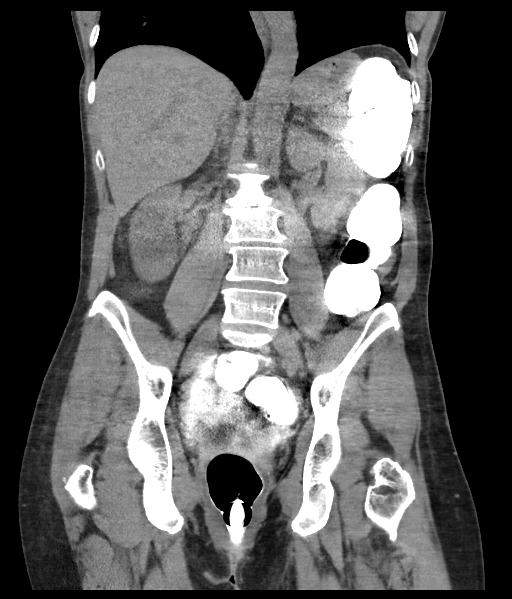

[16 of 46 positions shown; findings below may reference images not displayed]

FINDINGS: Lower chest: No acute findings.

Hepatobiliary: No mass visualized on this unenhanced exam. Small
cyst again noted in the posterior right hepatic lobe. Small amount
of excreted contrast seen within the bladder.

Pancreas: Suboptimally visualized due to artifact from rectally
administered contrast in the transverse colon.

Spleen:  Within normal limits in size.

Adrenals/Urinary tract: No evidence of urolithiasis or
hydronephrosis. Unremarkable unopacified urinary bladder.

Stomach/Bowel: Prior right hemicolectomy. Rectally administered
contrast material fills the entire colon, and refluxes through the
enterocolonic anastomosis into distal small bowel loops. Distal
small bowel remains mildly dilated, however no discrete transition
point is seen. This raises suspicion for ileus although partial
small bowel obstruction cannot be excluded. No focal inflammatory
process or abscess identified.

Vascular/Lymphatic: No pathologically enlarged lymph nodes
identified. No evidence of abdominal aortic aneurysm.

Reproductive:  No mass or other significant abnormality.

Other:  None.

Musculoskeletal:  No suspicious bone lesions identified.
IMPRESSION: Rectally administered contrast refluxes through the enterocolonic
anastomosis into distal small bowel loops, which remain mildly
dilated. No discrete transition point is seen. This could be due to
ileus, although partial/low-grade small bowel obstruction cannot be
excluded.

## 2019-11-29 ENCOUNTER — Encounter: Payer: Self-pay | Admitting: *Deleted

## 2019-12-03 ENCOUNTER — Other Ambulatory Visit: Payer: Self-pay | Admitting: Gastroenterology

## 2019-12-03 DIAGNOSIS — K922 Gastrointestinal hemorrhage, unspecified: Secondary | ICD-10-CM

## 2019-12-05 ENCOUNTER — Telehealth: Payer: Self-pay | Admitting: Gastroenterology

## 2019-12-05 NOTE — Telephone Encounter (Signed)
Pt is requesting a call back from a nurse to discuss her OMEPRAZOLE, pt states her insurance will only cover if the prescription states to take once a day.  Pharmacy verified.

## 2019-12-06 NOTE — Telephone Encounter (Signed)
PRIOR AUTHORIZATION - ELIGIBILITY CANNOT BE VERIFIED - PT MUST PROVIDE UPDATED INSURANCE INFO  PA initiation date: 12/06/19  Medication: Omeprazole Insurance Company: [employer], retail PPO Submission completed electronically through [employer] My Meds: Yes  Will await insurance response re: approval/denial.  Catherine Short Key: [id number] help? Call us at [phone] Status New(Not sent to plan) Eligibility could not be verified for this patient - patient not found. Please review patient information. Drug Omeprazole 40MG  dr capsules [facility] Electronic Prior Authorization Form ? There was an error with your request  ? Eligibility could not be verified for this patient - patient not found. Please review patient information.

## 2019-12-06 NOTE — Telephone Encounter (Signed)
Will need to submit PA with insurance for approval of higher dosage. Will need to await their response. PA will be submitted per protocol and policy.

## 2019-12-07 NOTE — Telephone Encounter (Signed)
[employer] at [phone] (back of card). Attempt was made to verify pt coverage but attendant informed me that the patient cannot be located using ID# or DOB. Unable to proceed with PA with updated insurance info. Called pt and informed her about the issues encountered. States she will call her ins co on Monday. Will await her returned call. Pt is aware that PA remains pending updated information.

## 2019-12-11 NOTE — Telephone Encounter (Signed)
In clinic today. Per our conversation last week, pt was aware that I will call during my downtime. Will f/u with pt tomorrow.

## 2019-12-11 NOTE — Telephone Encounter (Signed)
SECOND ATTEMPT TO COMPLETE PA:  Again entered pt info into Cover My Meds. Encountered another error indicating pt cannot be verified. Will need to address directly with both pt and ins co tomorrow. PA remains pending at this time.

## 2019-12-11 NOTE — Telephone Encounter (Signed)
Pt is requesting a call back from a nurse to discuss the pre authorization for the Omeprazole and Motegrity. Pt would like for the pre authorization be resubmitted  Pre Auth#: [id number]

## 2019-12-12 ENCOUNTER — Other Ambulatory Visit: Payer: Self-pay

## 2019-12-12 DIAGNOSIS — K2101 Gastro-esophageal reflux disease with esophagitis, with bleeding: Secondary | ICD-10-CM

## 2019-12-12 DIAGNOSIS — K5900 Constipation, unspecified: Secondary | ICD-10-CM

## 2019-12-12 DIAGNOSIS — R112 Nausea with vomiting, unspecified: Secondary | ICD-10-CM

## 2019-12-12 DIAGNOSIS — D509 Iron deficiency anemia, unspecified: Secondary | ICD-10-CM

## 2019-12-12 MED ORDER — OMEPRAZOLE 40 MG PO CPDR: 40.0000 mg | DELAYED_RELEASE_CAPSULE | Freq: Two times a day (BID) | ORAL | 2 refills | Status: DC

## 2019-12-12 MED ORDER — MOTEGRITY 2 MG PO TABS: 2.0000 mg | ORAL_TABLET | Freq: Every day | ORAL | 2 refills | Status: DC

## 2019-12-12 NOTE — Telephone Encounter (Signed)
Called [facility] to complete PA for Omeprazole and to f/u on pt concern for previously approved Motegrity. Customer Service [phone]. Spoke with Shilo.  APPROVAL  Medication: Omeprazole Insurance Company: [facility] PA response: Approved Approval dates: 12/12/19 through 12/11/20 Ref#: [id number]  Misc: Rx resubmitted to pharmacy with approval info  APPROVAL - UNCHANGED  Medication: [employer]: [facility] PA response: Remains Approved Approval dates: 11/07/19 through 11/06/20 Ref#: [id number] Added approval info: Rx must be filled by pharmacy for QD dosing, 30 tablets for 30 days. Test claim was submitted by Shilo and encountered no issues with processing request. Added, it appears pharmacy is attempting to fill Rx for more than 30 tabs/30days.   Misc: Rx has been resubmitted to pharmacy with approval info and reminder that they can only fill Rx for 30tabs/30days.  Called pt and informed her about all information documented above. Expressed appreciation, acceptance and understanding.

## 2019-12-31 ENCOUNTER — Other Ambulatory Visit: Payer: Self-pay | Admitting: Family Medicine

## 2019-12-31 DIAGNOSIS — F419 Anxiety disorder, unspecified: Secondary | ICD-10-CM

## 2019-12-31 DIAGNOSIS — N951 Menopausal and female climacteric states: Secondary | ICD-10-CM

## 2020-01-02 ENCOUNTER — Other Ambulatory Visit: Payer: Self-pay

## 2020-01-02 ENCOUNTER — Ambulatory Visit: Payer: BC Managed Care – PPO | Admitting: Family Medicine

## 2020-01-02 ENCOUNTER — Encounter: Payer: Self-pay | Admitting: Nurse Practitioner

## 2020-01-02 ENCOUNTER — Ambulatory Visit (INDEPENDENT_AMBULATORY_CARE_PROVIDER_SITE_OTHER): Payer: BC Managed Care – PPO | Admitting: Nurse Practitioner

## 2020-01-02 ENCOUNTER — Telehealth: Payer: Self-pay | Admitting: Family Medicine

## 2020-01-02 VITALS — BP 102/64 | HR 69 | Temp 97.8°F | Ht 63.0 in | Wt 139.8 lb

## 2020-01-02 DIAGNOSIS — J0100 Acute maxillary sinusitis, unspecified: Secondary | ICD-10-CM | POA: Diagnosis not present

## 2020-01-02 DIAGNOSIS — F419 Anxiety disorder, unspecified: Secondary | ICD-10-CM

## 2020-01-02 DIAGNOSIS — N951 Menopausal and female climacteric states: Secondary | ICD-10-CM

## 2020-01-02 MED ORDER — FLUOXETINE HCL 10 MG PO CAPS: 10.0000 mg | ORAL_CAPSULE | Freq: Every day | ORAL | 0 refills | Status: DC

## 2020-01-02 MED ORDER — AMOXICILLIN 875 MG PO TABS: 875.0000 mg | ORAL_TABLET | Freq: Two times a day (BID) | ORAL | 0 refills | Status: DC

## 2020-01-02 MED ORDER — GABAPENTIN 100 MG PO CAPS: 100.0000 mg | ORAL_CAPSULE | Freq: Three times a day (TID) | ORAL | 0 refills | Status: DC

## 2020-01-02 NOTE — Patient Instructions (Signed)
Sinusitis, Adult Sinusitis is inflammation of your sinuses. Sinuses are hollow spaces in the bones around your face. Your sinuses are located:  Around your eyes.  In the middle of your forehead.  Behind your nose.  In your cheekbones. Mucus normally drains out of your sinuses. When your nasal tissues become inflamed or swollen, mucus can become trapped or blocked. This allows bacteria, viruses, and fungi to grow, which leads to infection. Most infections of the sinuses are caused by a virus. Sinusitis can develop quickly. It can last for up to 4 weeks (acute) or for more than 12 weeks (chronic). Sinusitis often develops after a cold. What are the causes? This condition is caused by anything that creates swelling in the sinuses or stops mucus from draining. This includes:  Allergies.  Asthma.  Infection from bacteria or viruses.  Deformities or blockages in your nose or sinuses.  Abnormal growths in the nose (nasal polyps).  Pollutants, such as chemicals or irritants in the air.  Infection from fungi (rare). What increases the risk? You are more likely to develop this condition if you:  Have a weak body defense system (immune system).  Do a lot of swimming or diving.  Overuse nasal sprays.  Smoke. What are the signs or symptoms? The main symptoms of this condition are pain and a feeling of pressure around the affected sinuses. Other symptoms include:  Stuffy nose or congestion.  Thick drainage from your nose.  Swelling and warmth over the affected sinuses.  Headache.  Upper toothache.  A cough that may get worse at night.  Extra mucus that collects in the throat or the back of the nose (postnasal drip).  Decreased sense of smell and taste.  Fatigue.  A fever.  Sore throat.  Bad breath. How is this diagnosed? This condition is diagnosed based on:  Your symptoms.  Your medical history.  A physical exam.  Tests to find out if your condition is  acute or chronic. This may include: ? Checking your nose for nasal polyps. ? Viewing your sinuses using a device that has a light (endoscope). ? Testing for allergies or bacteria. ? Imaging tests, such as an MRI or CT scan. In rare cases, a bone biopsy may be done to rule out more serious types of fungal sinus disease. How is this treated? Treatment for sinusitis depends on the cause and whether your condition is chronic or acute.  If caused by a virus, your symptoms should go away on their own within 10 days. You may be given medicines to relieve symptoms. They include: ? Medicines that shrink swollen nasal passages (topical intranasal decongestants). ? Medicines that treat allergies (antihistamines). ? A spray that eases inflammation of the nostrils (topical intranasal corticosteroids). ? Rinses that help get rid of thick mucus in your nose (nasal saline washes).  If caused by bacteria, your health care provider may recommend waiting to see if your symptoms improve. Most bacterial infections will get better without antibiotic medicine. You may be given antibiotics if you have: ? A severe infection. ? A weak immune system.  If caused by narrow nasal passages or nasal polyps, you may need to have surgery. Follow these instructions at home: Medicines  Take, use, or apply over-the-counter and prescription medicines only as told by your health care provider. These may include nasal sprays.  If you were prescribed an antibiotic medicine, take it as told by your health care provider. Do not stop taking the antibiotic even if you start   to feel better. Hydrate and humidify   Drink enough fluid to keep your urine pale yellow. Staying hydrated will help to thin your mucus.  Use a cool mist humidifier to keep the humidity level in your home above 50%.  Inhale steam for 10-15 minutes, 3-4 times a day, or as told by your health care provider. You can do this in the bathroom while a hot shower is  running.  Limit your exposure to cool or dry air. Rest  Rest as much as possible.  Sleep with your head raised (elevated).  Make sure you get enough sleep each night. General instructions   Apply a warm, moist washcloth to your face 3-4 times a day or as told by your health care provider. This will help with discomfort.  Wash your hands often with soap and water to reduce your exposure to germs. If soap and water are not available, use hand sanitizer.  Do not smoke. Avoid being around people who are smoking (secondhand smoke).  Keep all follow-up visits as told by your health care provider. This is important. Contact a health care provider if:  You have a fever.  Your symptoms get worse.  Your symptoms do not improve within 10 days. Get help right away if:  You have a severe headache.  You have persistent vomiting.  You have severe pain or swelling around your face or eyes.  You have vision problems.  You develop confusion.  Your neck is stiff.  You have trouble breathing. Summary  Sinusitis is soreness and inflammation of your sinuses. Sinuses are hollow spaces in the bones around your face.  This condition is caused by nasal tissues that become inflamed or swollen. The swelling traps or blocks the flow of mucus. This allows bacteria, viruses, and fungi to grow, which leads to infection.  If you were prescribed an antibiotic medicine, take it as told by your health care provider. Do not stop taking the antibiotic even if you start to feel better.  Keep all follow-up visits as told by your health care provider. This is important. This information is not intended to replace advice given to you by your health care provider. Make sure you discuss any questions you have with your health care provider. Document Revised: 05/24/2017 Document Reviewed: 05/24/2017 Elsevier Patient Education  2020 [employer]  

## 2020-01-02 NOTE — Progress Notes (Addendum)
Established Patient Office Visit  Subjective:  Patient ID: Catherine Short, female    DOB: Apr 06, 1960  Age: 59 y.o. MRN: [id number]  CC:  Chief Complaint  Patient presents with  . Medical Management of Chronic Issues    HPI [place] TALK Depression: Patient complains of depression. She complains of depressed mood and weight gain. Onset was approximately a few months ago, unchanged since that time.  She denies current suicidal and homicidal plan or intent.   Family history significant for no psychiatric illness.Possible organic causes contributing are: endocrine/metabolic, Hormonal, menopause.  Risk factors: previous episode of depression Previous treatment includes Effexor. She complains of the following side effects from the treatment: none.   Anxiety: Patient complains of anxiety disorder.  She has the following symptoms: difficulty concentrating, irritable, palpitations. Onset of symptoms was approximately a few months ago, unchanged since that time. She denies current suicidal and homicidal ideation. Family history significant for no psychiatric illness.Possible organic causes contributing are: none. Risk factors: none Previous treatment includes Effexor.  She complains of the following side effects from the treatment: none.  Sinusitis: Patient presents with chronic sinusitis. The patient reports chronic sinus infections for 4 weeks.  Her symptoms include nasal congestion, facial pain, headache, ear pain.  There has not been a history of fever, lethargy, nausea or vomiting. There has not been a history of chronic otitis media or pharyngotonsillitis.  Prior antibiotic therapy has included: None in the last one year. Other medications have included Zyrtec.  She has not had allergy testing which was positive.     Past Medical History:  Diagnosis Date  . Allergy   . Anemia    d/t bleeding ulcers  . Anxiety   . Arthritis   . Chronic constipation   . Colitis   . Depression   .  Esophagitis   . Fibroid   . Gastritis   . GERD (gastroesophageal reflux disease)   . H/O cold sores   . H/O removal of cyst 10/2018   right arm   . Headache   . Heart murmur    "when I was 18"  . Hormone replacement therapy (HRT) 11/14/2014  . Hot flashes 11/14/2014  . Increased endometrial stripe thickness 05/30/2015   Will get biopsy  . Insomnia   . Liver cyst   . Menopause 11/14/2014  . [place] 05/23/2015  . Nerves 11/14/2014  . Night sweats 11/14/2014  . PMB (postmenopausal bleeding) 05/23/2015  . Prolactinoma ([place])   . Rectocele 05/23/2015  . Rotator cuff syndrome of left shoulder 10/30/2010  . Stomach ulcer   . Upper GI bleed 11/01/2018   due to ulcers    Past Surgical History:  Procedure Laterality Date  . ABDOMINAL SURGERY    . APPENDECTOMY    . AUGMENTATION MAMMAPLASTY  2000   saline implants  . BIOPSY  11/01/2018   Procedure: BIOPSY;  Surgeon: Danie Binder, MD;  Location: [facility];  Service: Endoscopy;;  . [place]  . CARPAL TUNNEL RELEASE    . COLONOSCOPY  12/2018  . ESOPHAGOGASTRODUODENOSCOPY (EGD) WITH PROPOFOL N/A 11/01/2018   Procedure: ESOPHAGOGASTRODUODENOSCOPY (EGD) WITH PROPOFOL;  Surgeon: Danie Binder, MD;  Location: [facility];  Service: Endoscopy;  Laterality: N/A;  . LAPAROSCOPIC RIGHT HEMI COLECTOMY Right 02/27/2018   Procedure: LAPAROSCOPIC RIGHT HEMI COLECTOMY;  Surgeon: [name] Signs, MD;  Location: [facility];  Service: General;  Laterality: Right;  . LAPAROTOMY N/A 02/27/2018   Procedure: EXPLORATORY LAPAROTOMY;  Surgeon: [name] Signs, MD;  Location: [facility];  Service: General;  Laterality: N/A;  . TONSILECTOMY, ADENOIDECTOMY, BILATERAL MYRINGOTOMY AND TUBES    . UPPER GASTROINTESTINAL ENDOSCOPY      Family History  Problem Relation Age of Onset  . Other Mother        digestive type issues  . Diabetes Mother   . Hypertension Mother   . Heart attack Father   . Heart disease Father   . Early death Brother         MVA  . Uterine cancer Maternal Grandmother   . Emphysema Maternal Grandfather   . Colon polyps Maternal Grandfather   . Alcohol abuse Brother   . Colon cancer Paternal Grandfather        Diagnosed in late 7s.   . Esophageal cancer Neg Hx   . Rectal cancer Neg Hx   . Stomach cancer Neg Hx     Social History   Socioeconomic History  . Marital status: Married    Spouse name: Not on file  . Number of children: 1  . Years of education: Not on file  . Highest education level: Not on file  Occupational History  . Occupation: Best boy: [employer]  Tobacco Use  . Smoking status: Former Smoker    Years: 6.00    Types: Cigarettes    Quit date: 03/06/1993    Years since quitting: 26.8  . Smokeless tobacco: Never Used  Vaping Use  . Vaping Use: Never used  Substance and Sexual Activity  . Alcohol use: Yes    Alcohol/week: 2.0 standard drinks    Types: 2 Glasses of wine per week    Comment: twice a week wine  . Drug use: No  . Sexual activity: Yes    Partners: Male    Birth control/protection: None, Other-see comments    Comment: husband with vasectomy  Other Topics Concern  . Not on file  Social History Narrative  . Not on file   Social Determinants of Health   Financial Resource Strain: Not on file  Food Insecurity: Not on file  Transportation Needs: Not on file  Physical Activity: Not on file  Stress: Not on file  Social Connections: Not on file  Intimate Partner Violence: Not on file    Outpatient Medications Prior to Visit  Medication Sig Dispense Refill  . acetaminophen (TYLENOL) 325 MG tablet Take 2 tablets (650 mg total) by mouth every 6 (six) hours as needed for mild pain (or Fever >/= 101). 12 tablet 0  . Amino Acids (AMINO ACID PO) Take 1 tablet by mouth every morning.    . ASHWAGANDHA PO Take 1 tablet by mouth every morning.     . bifidobacterium infantis (ALIGN) capsule Take 1 capsule by mouth daily. 30 capsule 0  .  [place] Oil-Levomenthol (FDGARD) 25-20.75 MG CAPS Use as directed; Lot#[id number], Exp date 07/2021 20 capsule 0  . cetirizine (ZYRTEC) 10 MG tablet Take 1 tablet (10 mg total) by mouth daily. 90 tablet 1  . Cholecalciferol (VITAMIN D-3) 25 MCG (1000 UT) CAPS Take 1,000 Units by mouth every morning.     . COLLAGEN PO Take 1 tablet by mouth every morning.     . diclofenac Sodium (VOLTAREN) 1 % GEL     . dicyclomine (BENTYL) 20 MG tablet Take 20 mg by mouth 3 (three) times daily.    . fluticasone (FLONASE) 50 MCG/ACT nasal spray Place 2  sprays into both nostrils daily. 32 g 3  . GLUTATHIONE PO Take 500 mg by mouth daily.    . Omega-3 Fatty Acids (FISH OIL PO) Take 1 capsule by mouth every evening. DHA and EFA-daily    . omeprazole (PRILOSEC) 40 MG capsule Take 1 capsule (40 mg total) by mouth 2 (two) times daily. 60 capsule 2  . ondansetron (ZOFRAN) 4 MG tablet Take 1 tablet (4 mg total) by mouth every 6 (six) hours as needed for nausea or vomiting. 20 tablet 0  . PREMARIN vaginal cream Place 1 Applicatorful vaginally at bedtime.     . Prucalopride Succinate (MOTEGRITY) 2 MG TABS Take 1 tablet (2 mg total) by mouth daily. 30 tablet 2  . tretinoin (RETIN-A) 0.1 % cream Apply 1 application topically at bedtime.    . valACYclovir (VALTREX) 500 MG tablet TAKE 1 TABLET BY MOUTH  DAILY 90 tablet 3  . vitamin E 400 UNIT capsule Take 400 Units by mouth daily.     [name] Kitchen erythromycin ophthalmic ointment     . linaclotide (LINZESS) 290 MCG CAPS capsule Take 1 capsule (290 mcg total) by mouth daily before breakfast. 30 capsule 3  . pantoprazole (PROTONIX) 40 MG tablet TAKE 1 TABLET BY MOUTH  TWICE DAILY BEFORE A MEAL 180 tablet 3  . venlafaxine XR (EFFEXOR-XR) 150 MG 24 hr capsule TAKE 1 CAPSULE BY MOUTH  DAILY WITH BREAKFAST 90 capsule 0   No facility-administered medications prior to visit.    Allergies  Allergen Reactions  . Flexeril [Cyclobenzaprine] Other (See Comments)    disoriented  . Nsaids Other  (See Comments)    Avoid use due to gastric ulcers.   . Sulfa Antibiotics   . Xyzal [Levocetirizine] Other (See Comments)    Headache    ROS Review of Systems  Endocrine:       Hot flashes  Psychiatric/Behavioral: Negative for self-injury, sleep disturbance and suicidal ideas. The patient is nervous/anxious.   All other systems reviewed and are negative.     Objective:    Physical Exam Vitals reviewed.  Constitutional:      Appearance: Normal appearance.  HENT:     Head: Normocephalic.     Nose: Nose normal.  Eyes:     Conjunctiva/sclera: Conjunctivae normal.  Cardiovascular:     Rate and Rhythm: Normal rate and regular rhythm.     Pulses: Normal pulses.     Heart sounds: Normal heart sounds.  Pulmonary:     Effort: Pulmonary effort is normal.     Breath sounds: Normal breath sounds.  Abdominal:     General: Bowel sounds are normal.  Musculoskeletal:        General: Normal range of motion.  Skin:    General: Skin is warm.  Neurological:     Mental Status: She is alert and oriented to person, place, and time.  Psychiatric:     Comments: Anxiety /depression     BP 102/64   Pulse 69   Temp 97.8 F (36.6 C)   Ht 5\' 3"  (1.6 m)   Wt 139 lb 12.8 oz (63.4 kg)   LMP 07/05/2016 (Approximate) Comment: neg upt  SpO2 95%   BMI 24.76 kg/m  Wt Readings from Last 3 Encounters:  01/02/20 139 lb 12.8 oz (63.4 kg)  11/07/19 135 lb (61.2 kg)  06/07/19 129 lb 3.2 oz (58.6 kg)     Health Maintenance Due  Topic Date Due  . Hepatitis C Screening  Never done  .  PAP SMEAR-Modifier  08/20/2019  . COVID-19 Vaccine (3 - Booster for Moderna series) 11/09/2019    There are no preventive care reminders to display for this patient.  Lab Results  Component Value Date   TSH 1.660 08/19/2016   Lab Results  Component Value Date   WBC 10.2 05/02/2019   HGB 13.1 05/02/2019   HCT 38.6 05/02/2019   MCV 94.9 05/02/2019   PLT 180.0 05/02/2019   Lab Results  Component Value  Date   NA 143 11/21/2018   K 4.4 11/21/2018   CO2 26 11/21/2018   GLUCOSE 91 11/21/2018   BUN 16 11/21/2018   CREATININE 0.83 11/21/2018   BILITOT 0.8 10/31/2018   ALKPHOS 35 (L) 10/31/2018   AST 23 10/31/2018   ALT 24 10/31/2018   PROT 5.8 (L) 10/31/2018   ALBUMIN 3.5 10/31/2018   CALCIUM 9.0 11/21/2018   ANIONGAP 5 11/01/2018   Lab Results  Component Value Date   CHOL 163 11/21/2018   Lab Results  Component Value Date   HDL 81 11/21/2018   Lab Results  Component Value Date   LDLCALC 69 11/21/2018   Lab Results  Component Value Date   TRIG 69 11/21/2018   Lab Results  Component Value Date   CHOLHDL 2.0 11/21/2018   Lab Results  Component Value Date   HGBA1C 5.4 05/23/2015   GAD 7 : Generalized Anxiety Score 01/02/2020 06/07/2019 01/03/2019 11/18/2018  Nervous, Anxious, on Edge 3 1 2 1   Control/stop worrying 3 1 2 2   Worry too much - different things 3 1 1 1   Trouble relaxing 3 1 1 2   Restless 3 1 3 2   Easily annoyed or irritable 3 1 1 2   Afraid - awful might happen 1 1 1 1   Total GAD 7 Score 19 7 11 11   Anxiety Difficulty Not difficult at all Not difficult at all Not difficult at all Somewhat difficult   [place] Office Visit from 01/02/2020 in [place]  PHQ-9 Total Score 14       Assessment & Plan:   Problem List Items Addressed This Visit      Cardiovascular and Mediastinum   Hot flashes due to menopause    Hot flashes due to menopause not well controlled. patient reports Effexor helped with hot flashes but did not control her mood.  Discontinued Effexor.  Started patient on Prozac 10 mg tablets daily, gabapentin 100 mg tablet twice daily.  Follow-up in 6 weeks.  If not effective will consider trying patient on GeneSight.   Rx sent to pharmacy.      Relevant Medications   gabapentin (NEURONTIN) 100 MG capsule     Respiratory   Subacute maxillary sinusitis - Primary   Relevant Medications   amoxicillin (AMOXIL)  875 MG tablet   Other Relevant Orders   Novel Coronavirus, NAA ([facility])     Other   Anxiety    Anxiety not well controlled on current medication Effexor.  Discontinued medication.  Started patient on 10 mg Prozac daily follow-up in 6 weeks. Rx sent to pharmacy.         Relevant Medications   FLUoxetine (PROZAC) 10 MG capsule      Meds ordered this encounter  Medications  . amoxicillin (AMOXIL) 875 MG tablet    Sig: Take 1 tablet (875 mg total) by mouth 2 (two) times daily.    Dispense:  20 tablet    Refill:  0    Order Specific Question:  Supervising Provider    Answer:   Janora Norlander [id number]  . gabapentin (NEURONTIN) 100 MG capsule    Sig: Take 1 capsule (100 mg total) by mouth 3 (three) times daily.    Dispense:  90 capsule    Refill:  0    Order Specific Question:   Supervising Provider    Answer:   Janora Norlander [id number]  . FLUoxetine (PROZAC) 10 MG capsule    Sig: Take 1 capsule (10 mg total) by mouth daily.    Dispense:  60 capsule    Refill:  0    Order Specific Question:   Supervising Provider    Answer:   Janora Norlander [id number]    Follow-up: Return if symptoms worsen or fail to improve.    Ivy Lynn, NP

## 2020-01-02 NOTE — Telephone Encounter (Signed)
Pt was returning call from [place] about medications

## 2020-01-02 NOTE — Assessment & Plan Note (Signed)
Anxiety not well controlled on current medication Effexor.  Discontinued medication.  Started patient on 10 mg Prozac daily follow-up in 6 weeks. Rx sent to pharmacy.

## 2020-01-02 NOTE — Assessment & Plan Note (Signed)
Hot flashes due to menopause not well controlled. patient reports Effexor helped with hot flashes but did not control her mood.  Discontinued Effexor.  Started patient on Prozac 10 mg tablets daily, gabapentin 100 mg tablet twice daily.  Follow-up in 6 weeks.  If not effective will consider trying patient on GeneSight.   Rx sent to pharmacy.

## 2020-01-02 NOTE — Assessment & Plan Note (Signed)
Subacute maxillary sinusitis no not well managed.  This has been ongoing in the last 4 weeks.  Patient is reporting headache, ear pain, and sinus pain.  Started patient on amoxicillin 875 mg tablet twice daily.  Completed COVID-19 swab results pending.  Increase hydration, Tylenol for headache.  Follow-up with worsening or unresolved symptoms. Rx sent to pharmacy.

## 2020-01-03 LAB — NOVEL CORONAVIRUS, NAA: SARS-CoV-2, NAA: NOT DETECTED

## 2020-01-03 LAB — SARS-COV-2, NAA 2 DAY TAT

## 2020-01-04 ENCOUNTER — Encounter: Payer: Self-pay | Admitting: Nurse Practitioner

## 2020-01-04 ENCOUNTER — Other Ambulatory Visit: Payer: Self-pay | Admitting: Nurse Practitioner

## 2020-01-04 MED ORDER — FLUCONAZOLE 150 MG PO TABS: 150.0000 mg | ORAL_TABLET | Freq: Once | ORAL | 0 refills | Status: AC

## 2020-01-04 NOTE — Telephone Encounter (Signed)
Patient asking for diflucan did not see in note is it okay to send?

## 2020-01-17 ENCOUNTER — Ambulatory Visit: Payer: BC Managed Care – PPO | Admitting: Family Medicine

## 2020-02-20 DIAGNOSIS — M654 Radial styloid tenosynovitis [de Quervain]: Secondary | ICD-10-CM | POA: Diagnosis not present

## 2020-02-21 ENCOUNTER — Telehealth: Payer: Self-pay | Admitting: Gastroenterology

## 2020-02-21 ENCOUNTER — Emergency Department (HOSPITAL_COMMUNITY): Payer: BC Managed Care – PPO

## 2020-02-21 ENCOUNTER — Emergency Department (HOSPITAL_COMMUNITY)
Admission: EM | Admit: 2020-02-21 | Discharge: 2020-02-21 | Disposition: A | Discharge status: Home / Self Care | Payer: BC Managed Care – PPO | Attending: Emergency Medicine | Admitting: Emergency Medicine

## 2020-02-21 ENCOUNTER — Encounter (HOSPITAL_COMMUNITY): Payer: Self-pay

## 2020-02-21 ENCOUNTER — Other Ambulatory Visit: Payer: Self-pay

## 2020-02-21 DIAGNOSIS — R630 Anorexia: Secondary | ICD-10-CM | POA: Insufficient documentation

## 2020-02-21 DIAGNOSIS — Z9089 Acquired absence of other organs: Secondary | ICD-10-CM | POA: Insufficient documentation

## 2020-02-21 DIAGNOSIS — R9431 Abnormal electrocardiogram [ECG] [EKG]: Secondary | ICD-10-CM | POA: Diagnosis not present

## 2020-02-21 DIAGNOSIS — R197 Diarrhea, unspecified: Secondary | ICD-10-CM | POA: Diagnosis not present

## 2020-02-21 DIAGNOSIS — Z8719 Personal history of other diseases of the digestive system: Secondary | ICD-10-CM | POA: Diagnosis not present

## 2020-02-21 DIAGNOSIS — R110 Nausea: Secondary | ICD-10-CM | POA: Insufficient documentation

## 2020-02-21 DIAGNOSIS — Z87891 Personal history of nicotine dependence: Secondary | ICD-10-CM | POA: Diagnosis not present

## 2020-02-21 DIAGNOSIS — R1031 Right lower quadrant pain: Secondary | ICD-10-CM | POA: Insufficient documentation

## 2020-02-21 DIAGNOSIS — R109 Unspecified abdominal pain: Secondary | ICD-10-CM | POA: Diagnosis not present

## 2020-02-21 LAB — URINALYSIS, ROUTINE W REFLEX MICROSCOPIC
Bacteria, UA: NONE SEEN
Bilirubin Urine: NEGATIVE
Glucose, UA: NEGATIVE mg/dL
Ketones, ur: 20 mg/dL — AB
Leukocytes,Ua: NEGATIVE
Nitrite: NEGATIVE
Protein, ur: 30 mg/dL — AB
Specific Gravity, Urine: 1.026 (ref 1.005–1.030)
pH: 5 (ref 5.0–8.0)

## 2020-02-21 LAB — CBC
HCT: 44.8 % (ref 36.0–46.0)
Hemoglobin: 15 g/dL (ref 12.0–15.0)
MCH: 32.4 pg (ref 26.0–34.0)
MCHC: 33.5 g/dL (ref 30.0–36.0)
MCV: 96.8 fL (ref 80.0–100.0)
Platelets: 270 10*3/uL (ref 150–400)
RBC: 4.63 MIL/uL (ref 3.87–5.11)
RDW: 13.2 % (ref 11.5–15.5)
WBC: 15.5 10*3/uL — ABNORMAL HIGH (ref 4.0–10.5)
nRBC: 0 % (ref 0.0–0.2)

## 2020-02-21 LAB — COMPREHENSIVE METABOLIC PANEL
ALT: 31 U/L (ref 0–44)
AST: 25 U/L (ref 15–41)
Albumin: 4.9 g/dL (ref 3.5–5.0)
Alkaline Phosphatase: 39 U/L (ref 38–126)
Anion gap: 9 (ref 5–15)
BUN: 28 mg/dL — ABNORMAL HIGH (ref 6–20)
CO2: 25 mmol/L (ref 22–32)
Calcium: 9.4 mg/dL (ref 8.9–10.3)
Chloride: 98 mmol/L (ref 98–111)
Creatinine, Ser: 0.84 mg/dL (ref 0.44–1.00)
GFR, Estimated: >60 mL/min (ref >60)
Glucose, Bld: 109 mg/dL — ABNORMAL HIGH (ref 70–99)
Potassium: 3.5 mmol/L (ref 3.5–5.1)
Sodium: 132 mmol/L — ABNORMAL LOW (ref 135–145)
Total Bilirubin: 0.9 mg/dL (ref 0.3–1.2)
Total Protein: 7.3 g/dL (ref 6.5–8.1)

## 2020-02-21 LAB — TROPONIN I (HIGH SENSITIVITY): Troponin I (High Sensitivity): 2 ng/L (ref <18)

## 2020-02-21 LAB — LIPASE, BLOOD: Lipase: 29 U/L (ref 11–51)

## 2020-02-21 MED ORDER — DICYCLOMINE HCL 10 MG PO CAPS
10.0000 mg | ORAL_CAPSULE | Freq: Once | ORAL | Status: AC
  Administered 2020-02-21: 10 mg via ORAL
  Filled 2020-02-21: qty 1

## 2020-02-21 MED ORDER — ONDANSETRON HCL 4 MG/2ML IJ SOLN
4.0000 mg | Freq: Once | INTRAMUSCULAR | Status: AC
  Administered 2020-02-21: 4 mg via INTRAVENOUS
  Filled 2020-02-21: qty 2

## 2020-02-21 MED ORDER — SODIUM CHLORIDE 0.9 % IV BOLUS
1000.0000 mL | Freq: Once | INTRAVENOUS | Status: AC
  Administered 2020-02-21: 1000 mL via INTRAVENOUS

## 2020-02-21 MED ORDER — AMOXICILLIN-POT CLAVULANATE 875-125 MG PO TABS: 1.0000 | ORAL_TABLET | Freq: Two times a day (BID) | ORAL | 0 refills | Status: AC

## 2020-02-21 MED ORDER — IOHEXOL 300 MG/ML  SOLN
100.0000 mL | Freq: Once | INTRAMUSCULAR | Status: AC | PRN
  Administered 2020-02-21: 100 mL via INTRAVENOUS

## 2020-02-21 NOTE — ED Notes (Signed)
Informed Dr Cathlean Sauer that antibiotics were started.  Prefer antibiotics and potassium to be given first and then start RBC.

## 2020-02-21 NOTE — ED Provider Notes (Signed)
[facility] EMERGENCY DEPARTMENT Provider Note   CSN: [id number] Arrival date & time: 02/21/20  1225     History Chief Complaint  Patient presents with  . Abdominal Pain    Catherine Short is a 60 y.o. female.  HPI   Patient with significant medical history of anxiety, depression, GERD, bowel obstruction, right hemicolectomy, cecum volvulus presents with  chief complaint of right-sided abdominal pain.  Patient endorses her pain started suddenly, started yesterday morning and has increasing gotten worse.  She endorses a sharp sensation in her right lower quadrant which then radiates up into her chest, she has had associated nausea without vomiting,  diarrhea without blood in it, denies urinary symptoms, hematuria, vaginal discharge or vaginal bleeding.  Endorses that the pain radiates up into her chest but denies associated shortness of breath, becoming diaphoretic, dizziness or lightheadedness.  States she has had a loss of appetite, has only eaten some bread and drank water.  She states she has had this in the past approximately 3 weeks ago, started suddenly but resolved on its own, she is concerned that this may be another bowel obstruction.  Patient denies any change in her medications, has no previous cardiac history, connective tissue disorders or history of aortic aneurysms.  Patient denies any alleviating factors.     Past Medical History:  Diagnosis Date  . Allergy   . Anemia    d/t bleeding ulcers  . Anxiety   . Arthritis   . Chronic constipation   . Colitis   . Depression   . Esophagitis   . Fibroid   . Gastritis   . GERD (gastroesophageal reflux disease)   . H/O cold sores   . H/O removal of cyst 10/2018   right arm   . Headache   . Heart murmur    "when I was 18"  . Hormone replacement therapy (HRT) 11/14/2014  . Hot flashes 11/14/2014  . Increased endometrial stripe thickness 05/30/2015   Will get biopsy  . Insomnia   . Liver cyst   . Menopause 11/14/2014  .  [place] 05/23/2015  . Nerves 11/14/2014  . Night sweats 11/14/2014  . PMB (postmenopausal bleeding) 05/23/2015  . Prolactinoma ([place])   . Rectocele 05/23/2015  . Rotator cuff syndrome of left shoulder 10/30/2010  . Stomach ulcer   . Upper GI bleed 11/01/2018   due to ulcers    Patient Active Problem List   Diagnosis Date Noted  . Subacute maxillary sinusitis 01/02/2020  . Prolactinoma ([place])   . Anemia   . Acute GI bleeding 10/31/2018  . Anxiety   . Small bowel obstruction ([place]) 09/02/2018  . Primary osteoarthritis of both first carpometacarpal joints 08/18/2018  . Cecal volvulus ([place])   . Increased endometrial stripe thickness 05/30/2015  . Rectocele 05/23/2015  . Menopause 11/14/2014  . Hot flashes due to menopause 11/14/2014  . Muscle spasms of neck 10/30/2010  . Microscopic colitis 11/11/2009    Past Surgical History:  Procedure Laterality Date  . ABDOMINAL SURGERY    . APPENDECTOMY    . AUGMENTATION MAMMAPLASTY  2000   saline implants  . BIOPSY  11/01/2018   Procedure: BIOPSY;  Surgeon: Danie Binder, MD;  Location: [facility] ENDO SUITE;  Service: Endoscopy;;  . [place]  . CARPAL TUNNEL RELEASE    . COLONOSCOPY  12/2018  . ESOPHAGOGASTRODUODENOSCOPY (EGD) WITH PROPOFOL N/A 11/01/2018   Procedure: ESOPHAGOGASTRODUODENOSCOPY (EGD) WITH PROPOFOL;  Surgeon: Danie Binder, MD;  Location:  [facility] ENDO SUITE;  Service: Endoscopy;  Laterality: N/A;  . LAPAROSCOPIC RIGHT HEMI COLECTOMY Right 02/27/2018   Procedure: LAPAROSCOPIC RIGHT HEMI COLECTOMY;  Surgeon: [name] Signs, MD;  Location: [facility] ORS;  Service: General;  Laterality: Right;  . LAPAROTOMY N/A 02/27/2018   Procedure: EXPLORATORY LAPAROTOMY;  Surgeon: [name] Signs, MD;  Location: [facility] ORS;  Service: General;  Laterality: N/A;  . TONSILECTOMY, ADENOIDECTOMY, BILATERAL MYRINGOTOMY AND TUBES    . UPPER GASTROINTESTINAL ENDOSCOPY       OB History    Gravida  2   Para  1   Term      Preterm      AB  1    Living  1     SAB  1   IAB      Ectopic      Multiple      Live Births              Family History  Problem Relation Age of Onset  . Other Mother        digestive type issues  . Diabetes Mother   . Hypertension Mother   . Heart attack Father   . Heart disease Father   . Early death Brother        MVA  . Uterine cancer Maternal Grandmother   . Emphysema Maternal Grandfather   . Colon polyps Maternal Grandfather   . Alcohol abuse Brother   . Colon cancer Paternal Grandfather        Diagnosed in late 77s.   . Esophageal cancer Neg Hx   . Rectal cancer Neg Hx   . Stomach cancer Neg Hx     Social History   Tobacco Use  . Smoking status: Former Smoker    Years: 6.00    Types: Cigarettes    Quit date: 03/06/1993    Years since quitting: 26.9  . Smokeless tobacco: Never Used  Vaping Use  . Vaping Use: Never used  Substance Use Topics  . Alcohol use: Yes    Alcohol/week: 2.0 standard drinks    Types: 2 Glasses of wine per week    Comment: twice a week wine  . Drug use: No    Home Medications Prior to Admission medications   Medication Sig Start Date End Date Taking? Authorizing Provider  acetaminophen (TYLENOL) 325 MG tablet Take 2 tablets (650 mg total) by mouth every 6 (six) hours as needed for mild pain (or Fever >/= 101). 11/01/18  Yes Roxan Hockey, MD  amoxicillin-clavulanate (AUGMENTIN) 875-125 MG tablet Take 1 tablet by mouth every 12 (twelve) hours for 7 days. 02/21/20 02/28/20 Yes Marcello Fennel, PA-C  cetirizine (ZYRTEC) 10 MG tablet Take 1 tablet (10 mg total) by mouth daily. 06/07/19  Yes Hendricks Limes F, FNP  Cholecalciferol (VITAMIN D-3) 25 MCG (1000 UT) CAPS Take 1,000 Units by mouth every morning.    Yes [provider]  COLLAGEN PO Take 1 tablet by mouth every morning.    Yes [provider]  diclofenac Sodium (VOLTAREN) 1 % GEL Apply 2 g topically daily as needed. 12/09/18  Yes [provider]  dicyclomine  (BENTYL) 20 MG tablet Take 20 mg by mouth 3 (three) times daily. 11/18/18  Yes [provider]  fluticasone (FLONASE) 50 MCG/ACT nasal spray Place 2 sprays into both nostrils daily. 06/07/19  Yes Hendricks Limes F, FNP  gabapentin (NEURONTIN) 100 MG capsule Take 1 capsule (100 mg total) by mouth 3 (three) times daily. Patient taking  differently: Take 100 mg by mouth daily. 01/02/20  Yes Ivy Lynn, NP  Omega-3 Fatty Acids (FISH OIL PO) Take 1 capsule by mouth every evening. DHA and EFA-daily   Yes [provider]  omeprazole (PRILOSEC) 40 MG capsule Take 1 capsule (40 mg total) by mouth 2 (two) times daily. 12/12/19  Yes Thornton Park, MD  ondansetron (ZOFRAN) 4 MG tablet Take 1 tablet (4 mg total) by mouth every 6 (six) hours as needed for nausea or vomiting. 11/01/18  Yes Roxan Hockey, MD  PREMARIN vaginal cream Place 1 Applicatorful vaginally at bedtime.  10/24/18  Yes [provider]  Prucalopride Succinate (MOTEGRITY) 2 MG TABS Take 1 tablet (2 mg total) by mouth daily. 12/12/19  Yes Thornton Park, MD  tretinoin (RETIN-A) 0.1 % cream Apply 1 application topically at bedtime. 10/28/18  Yes [provider]  valACYclovir (VALTREX) 500 MG tablet TAKE 1 TABLET BY MOUTH  DAILY 04/04/19  Yes Sheffield, Kelli R, PA-C  amoxicillin (AMOXIL) 875 MG tablet Take 1 tablet (875 mg total) by mouth 2 (two) times daily. Patient not taking: No sig reported 01/02/20   Ivy Lynn, NP  bifidobacterium infantis (ALIGN) capsule Take 1 capsule by mouth daily. Patient not taking: No sig reported 11/07/19   Thornton Park, MD  [place] Oil-Levomenthol (FDGARD) 25-20.75 MG CAPS Use as directed; Lot#[id number], Exp date 07/2021 Patient not taking: No sig reported 11/07/19   Thornton Park, MD  FLUoxetine (PROZAC) 10 MG capsule Take 1 capsule (10 mg total) by mouth daily. Patient not taking: No sig reported 01/02/20   Ivy Lynn, NP    Allergies    Flexeril  [cyclobenzaprine], Nsaids, Sulfa antibiotics, and Xyzal [levocetirizine]  Review of Systems   Review of Systems  Constitutional: Positive for chills. Negative for fever.  HENT: Negative for congestion and sore throat.   Eyes: Negative for visual disturbance.  Respiratory: Negative for shortness of breath.   Cardiovascular: Negative for chest pain.  Gastrointestinal: Positive for abdominal pain and nausea. Negative for blood in stool, constipation and vomiting.  Genitourinary: Negative for difficulty urinating, enuresis, urgency, vaginal bleeding, vaginal discharge and vaginal pain.  Musculoskeletal: Negative for back pain.  Skin: Negative for rash.  Neurological: Negative for dizziness and headaches.  Hematological: Does not bruise/bleed easily.    Physical Exam Updated Vital Signs BP 125/82   Pulse 69   Temp 97.9 F (36.6 C) (Oral)   Resp 17   Ht 5' 3" (1.6 m)   Wt 58.1 kg   LMP 07/05/2016 (Approximate) Comment: neg upt  SpO2 100%   BMI 22.67 kg/m   Physical Exam Vitals and nursing note reviewed.  Constitutional:      General: She is not in acute distress.    Appearance: She is not ill-appearing.  HENT:     Head: Normocephalic and atraumatic.     Nose: No congestion.  Eyes:     Conjunctiva/sclera: Conjunctivae normal.  Cardiovascular:     Rate and Rhythm: Normal rate and regular rhythm.     Pulses: Normal pulses.     Heart sounds: No murmur heard. No friction rub. No gallop.   Pulmonary:     Effort: No respiratory distress.     Breath sounds: No wheezing, rhonchi or rales.  Abdominal:     General: There is distension.     Palpations: Abdomen is soft.     Tenderness: There is abdominal tenderness. There is no right CVA tenderness or left CVA tenderness.  Comments: Patient's abdomen is slightly distended, dull to percussion, normoactive bowel sounds, potential patient greatest in her right lower quadrant, negative peritoneal sign, negative Murphy or McBurney  points.  No CVA tenderness.  Musculoskeletal:     Right lower leg: No edema.     Left lower leg: No edema.     Comments: Patient is moving all 4 extremities at difficulty.  Skin:    General: Skin is warm and dry.  Neurological:     Mental Status: She is alert.  Psychiatric:        Mood and Affect: Mood normal.     ED Results / Procedures / Treatments   Labs (all labs ordered are listed, but only abnormal results are displayed) Labs Reviewed  COMPREHENSIVE METABOLIC PANEL - Abnormal; Notable for the following components:      Result Value   Sodium 132 (*)    Glucose, Bld 109 (*)    BUN 28 (*)    All other components within normal limits  CBC - Abnormal; Notable for the following components:   WBC 15.5 (*)    All other components within normal limits  URINALYSIS, ROUTINE W REFLEX MICROSCOPIC - Abnormal; Notable for the following components:   Hgb urine dipstick MODERATE (*)    Ketones, ur 20 (*)    Protein, ur 30 (*)    All other components within normal limits  LIPASE, BLOOD  TROPONIN I (HIGH SENSITIVITY)  TROPONIN I (HIGH SENSITIVITY)    EKG EKG Interpretation  Date/Time:  Wednesday February 21 2020 14:24:32 EST Ventricular Rate:  64 PR Interval:    QRS Duration: 84 QT Interval:  406 QTC Calculation: 419 R Axis:   -2 Text Interpretation: Sinus rhythm Left atrial enlargement Confirmed by Thamas Jaegers ([id number]) on 02/21/2020 2:45:25 PM   Radiology CT Abdomen Pelvis W Contrast  Result Date: 02/21/2020 CLINICAL DATA:  Right abdominal pain, nausea and diarrhea for 2 days with abdominal distention. EXAM: CT ABDOMEN AND PELVIS WITH CONTRAST TECHNIQUE: Multidetector CT imaging of the abdomen and pelvis was performed using the standard protocol following bolus administration of intravenous contrast. CONTRAST:  111m OMNIPAQUE IOHEXOL 300 MG/ML  SOLN COMPARISON:  10/31/2018 CT abdomen/pelvis. FINDINGS: Lower chest: No significant pulmonary nodules or acute consolidative  airspace disease. Partially visualized bilateral breast prostheses. Hepatobiliary: Normal liver size. Simple 1.5 cm posteroinferior right liver cyst. A few scattered tiny subcentimeter hypodense liver lesions are too small to characterize and appear unchanged, presumably benign. No new liver lesions. Normal gallbladder with no radiopaque cholelithiasis. No biliary ductal dilatation. Pancreas: Normal, with no mass or duct dilation. Spleen: Normal size. No mass. Adrenals/Urinary Tract: Normal adrenals. Few scattered subcentimeter hypodense renal cortical lesions in both kidneys, too small to characterize, unchanged, considered benign. Otherwise normal kidneys, with no hydronephrosis. Normal bladder. Stomach/Bowel: Normal non-distended stomach. Postsurgical changes from right hemicolectomy with intact and patent appearing ileocolic anastomosis in medial right abdomen. There is mild dilatation and mild wall thickening throughout the distal small bowel leading up to the anastomosis (approximately distal 20 cm of small bowel) with associated mild haziness of right mesenteric fat. Normal caliber proximal small bowel loops. No wall thickening, diverticulosis or pericolonic fat stranding in the remnant collapsed large bowel. Vascular/Lymphatic: Normal caliber abdominal aorta. Patent portal, splenic, hepatic and renal veins. Retroaortic left renal vein. No pathologically enlarged lymph nodes in the abdomen or pelvis. Reproductive: Grossly normal uterus.  No adnexal mass. Other: No pneumoperitoneum, ascites or focal fluid collection. Musculoskeletal: No aggressive appearing focal osseous  lesions. Moderate lumbar spondylosis. IMPRESSION: Mild dilatation and mild wall thickening throughout the distal small bowel leading up to the ileocolic anastomosis in the medial right abdomen, with associated mild haziness of the right mesenteric fat. Findings are most suggestive of a nonspecific enteritis with associated mild ileus.  Ileocolic anastomosis appears patent and intact. Electronically Signed   By: Ilona Sorrel M.D.   On: 02/21/2020 16:27    Procedures Procedures   Medications Ordered in ED Medications  ondansetron (ZOFRAN) injection 4 mg (4 mg Intravenous Given 02/21/20 1505)  dicyclomine (BENTYL) capsule 10 mg (10 mg Oral Given 02/21/20 1505)  iohexol (OMNIPAQUE) 300 MG/ML solution 100 mL (100 mLs Intravenous Contrast Given 02/21/20 1559)  sodium chloride 0.9 % bolus 1,000 mL (0 mLs Intravenous Stopped 02/21/20 1717)    ED Course  I have reviewed the triage vital signs and the nursing notes.  Pertinent labs & imaging results that were available during my care of the patient were reviewed by me and considered in my medical decision making (see chart for details).    MDM Rules/Calculators/A&P                          Initial impression-patient presents with sudden onset of right lower quadrant pain.  She is alert, does not appear in acute distress, vital signs reassuring.  Triage obtain basic lab work, will add on CT scan of abdomen looking for bowel obstruction, troponin and EKG for possible atypical ACS.  Work-up-CBC shows leukocytosis 13.5, no signs anemia.  CMP shows slight hyponatremia of 132, slight hyperglycemia 109, slightly elevated BUN of 28, below baseline, no anion gap present.  UA shows ketones, proteins, no nitrates or leukocytes, shows some red blood cells, no white blood cells no bacteria present.  Initial troponin is 2, EKG sinus without signs of ischemia no ST elevation depression noted.  CT abdomen pelvis shows mild dilation mild wall thickening throughout the distal small bowel, with associated mild haziness of the right mesenteric fat findings consistent with enteritis.   Reassessment patient was reassessed, states she has no complaints at this time, vital signs remained stable.  Updated patient on lab and imaging finding.  Patient is agreeable for discharge.  Rule out-low suspicion for  ACS as history is atypical, patient has no cardiac history, EKG sinus without signs of ischemia, initial troponin is 2.  Will defer second troponin as chest pains been gone for over 12 hours, would expect elevation at this time.  Low suspicion for AAA as history is atypical, patient has low risk factors.  Low suspicion for gallbladder abnormality as there is no right upper quadrant pain, no elevation in AST or ALT, or alk phos.  Low suspicion for pancreatitis as lipase is 29.  Low suspicion for bowel obstruction or abscess as there is no findings of this seen on CT abdomen pelvis.  Low suspicion for UTI or pyelonephritis as patient denies urinary symptoms, UA is negative for signs of infection.  Plan-I suspect patient suffering from enteritis unsure if this is infectious versus inflammatory., will start her on antibiotics and have her follow-up with her GI doctor for further evaluation.  Vital signs have remained stable, no indication for hospital admission.  Patient discussed with attending and they agreed with assessment and plan.  Patient given at home care as well strict return precautions.  Patient verbalized that they understood agreed to said plan.   Final Clinical Impression(s) / ED Diagnoses Final diagnoses:  Right lower quadrant abdominal pain  Diarrhea, unspecified type    Rx / DC Orders ED Discharge Orders         Ordered    amoxicillin-clavulanate (AUGMENTIN) 875-125 MG tablet  Every 12 hours        02/21/20 1750           Aron Baba 02/21/20 1753    Luna Fuse, MD 02/24/20 1500

## 2020-02-21 NOTE — ED Notes (Signed)
C/o nausea and diarrhea prior to arrival.  C/o abdominal tenderness, radiating from one side of abdomen to other. Rates pain 7/10.  Reported chills this am.

## 2020-02-21 NOTE — Telephone Encounter (Signed)
My apologizes that I am just now seeing this message. I am sorry to hear that she is feeling so bad. Given the location and severity of her symptoms, I recommend that she seek care in the ED where they can hopefully perform a CT scan if appropriate. Thanks.

## 2020-02-21 NOTE — ED Triage Notes (Signed)
Pt to er, pt states that she is here for some abdominal pain, states that she has had a hx of abd pain, states that she had a colectomy at one point, and then has had some obstructions.  Pt states that she has some abd distension.  Pt states that she has nausea, denies vomiting.

## 2020-02-21 NOTE — Telephone Encounter (Signed)
Pts states that she has been experiencing abd pain on her belly button. Pain began yesterday and has increased to this morning. Pt is feeling nauseous. She would like some advise.

## 2020-02-21 NOTE — Telephone Encounter (Signed)
Spoke with pt and she is currently in the ER, she has had the CT scan and is waiting on results.

## 2020-02-21 NOTE — Discharge Instructions (Addendum)
seen here for abdominal pain.  Lab work and imaging looks reassuring.  Starting you on an antibiotic please take as prescribed.  I recommend following up with your GI doctor in 1 week's time for reevaluation.  Come back to the emergency department if you develop chest pain, shortness of breath, severe abdominal pain, uncontrolled nausea, vomiting, diarrhea.

## 2020-02-21 NOTE — Telephone Encounter (Signed)
Pt states she started having pain at her belly button a few days ago. States she had a colectomy done in 2020. Pt reports the pain was excruciating last night, today it has eased off some. States the area feels warm to touch. Pt wondered about a possible blockage. Please advise.

## 2020-02-22 ENCOUNTER — Telehealth: Payer: Self-pay | Admitting: Gastroenterology

## 2020-02-22 NOTE — Telephone Encounter (Signed)
Spoke with pt and she is aware of Dr. Tarri Glenn' recommendations. Pt scheduled to see Dr. Tarri Glenn tomorrow at 10:30am. GI doc she saw with colitis in the past was Dr. Deatra Ina. Will look for records.

## 2020-02-22 NOTE — Telephone Encounter (Signed)
I am sorry to hear that she is feeling so bad.  Ideally, she would have a GI pathogen panel prior to starting to antibiotics.  Please obtain the records from the gastroenterologist who treated her for colitis.   Additional recommendations include: - Drink a lot of liquids that have water, salt, and sugar. Good choices are water mixed with juice, flavored soda, and soup broth. If you are drinking enough, your urine will be light yellow or almost clear. - Try to eat a little food. Good choices are potatoes, noodles, rice, oatmeal, crackers, bananas, soup, and boiled vegetables. - Avoid high fat foods, as they can make diarrhea worse.  - Dairy products (except yogurt) may be difficult to digest when you have diarrhea. I recommend that you temporarily avoid lactose-containing foods.  - I recommend a trial of bismuth salicylate (Pepto-Bismol) 30 mL or two tablets every 30 minutes for eight doses. Pepto-Bismol may make your stools black. - A daily probiotic may be very helpful.  Schedule follow-up visit with me or APP.  Thanks.

## 2020-02-22 NOTE — Telephone Encounter (Signed)
Pt was at the ED yesterday and was dx with colitis. She was put on antibiotics but is afraid to take them . Pt is still having watery diarrhea. She states that last time she had an episode of colitis she was put on Lialda and it helped her so she wants to know if Dr. Tarri Glenn could prescribe it. Pt has not been able to go to work today due to sxs so would like a call back with advise asap.

## 2020-02-22 NOTE — Telephone Encounter (Signed)
Noted  

## 2020-02-22 NOTE — Telephone Encounter (Signed)
Left message for pt to call back.  Spoke with pt, she states she was diagnosed with colitis yesterday in the ER. The ER gave her Augmentin but she really does not want to take it. She reports she was given lialda in the past for colitis. Reports she has had 6 clear diarrhea stools already this am. Let pt know Dr. Tarri Glenn would not be in until the afternoon but we will get back to her once Dr. Tarri Glenn reviews the message. Please advise.

## 2020-02-23 ENCOUNTER — Ambulatory Visit (INDEPENDENT_AMBULATORY_CARE_PROVIDER_SITE_OTHER): Payer: BC Managed Care – PPO | Admitting: Gastroenterology

## 2020-02-23 ENCOUNTER — Other Ambulatory Visit: Payer: Self-pay

## 2020-02-23 ENCOUNTER — Encounter: Payer: Self-pay | Admitting: Gastroenterology

## 2020-02-23 VITALS — BP 108/70 | HR 56 | Ht 63.0 in | Wt 131.0 lb

## 2020-02-23 DIAGNOSIS — R933 Abnormal findings on diagnostic imaging of other parts of digestive tract: Secondary | ICD-10-CM

## 2020-02-23 DIAGNOSIS — K529 Noninfective gastroenteritis and colitis, unspecified: Secondary | ICD-10-CM | POA: Diagnosis not present

## 2020-02-23 MED ORDER — ONDANSETRON HCL 4 MG PO TABS: 4.0000 mg | ORAL_TABLET | Freq: Four times a day (QID) | ORAL | 1 refills | Status: DC | PRN

## 2020-02-23 MED ORDER — DICYCLOMINE HCL 20 MG PO TABS: 20.0000 mg | ORAL_TABLET | Freq: Three times a day (TID) | ORAL | 2 refills | Status: DC

## 2020-02-23 NOTE — Progress Notes (Addendum)
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 Suspect viral etiologies. No recurrent SBO on CT. We reviewed these findings are different from microscopic colitis. She may consider taking the Augmentin as prescribed by the ED if her symptoms are not improving. Plan follow-up CT if symptoms do not resolve.   Chronic constipation not responding to Miralax daily, Citrucel, magnesium oxide, swiss chris, Amitiaz, or Linzess to 290 mcg daily: No with loose stools related to acute enteritis.  Nausea: Temporally associated with progressive constipation. May reflect diffuse GI dysmotility disorder.   H pylori negative gastritis, reflux, and PUD: Clinical improvement noted on last EGD. Continue pantoprazole 40 mg BID. Avoid all NSAIDs. She does not feel her nausea is related to recurrent PUD.   History of iron deficiency anemia: Likely due to history of NSAID-related PUD and gastritis. Labs in April documented resolution of anemia.     PLAN: - Bentyl 20 mg QID PRN - Refilled Zofran - Encouraged use of daily probiotic - Go to the ER with escalating  symptoms given high risk for recurrent SBO - Repeat CT scan if symptoms are not improving - Return in 3-4 weeks, earlier if needed  Please see the "Patient Instructions" section for addition details about the plan.  HPI: Catherine Short is a 60 y.o. female who is seen as an urgent work in appointment after being seen in the ED. Internal history obtained through the patient and review of records from her recent ED visit. She has a istory of microscopic colitis diagnosed in 2011 (Dr. Deatra Ina) but had not been on any treatment since that time, cecal volvulus requiring right hemicolectomy in 02/2018 at [facility], hospitalization for SBO 10/20, NSAID induced gastric ulcers on EGD 10/20 (Dr. Oneida Alar) with follow-up EGD showing documented healing, and chronic constipation.   Colonoscopy 12/19/2018 showed end-to-end ileocolonic anastomosis and was otherwise normal.  Repeat colonoscopy recommended in 10 years.  3 weeks ago awoke with symptoms like her SBO that awoke her from sleep. Resolved sponatenously within 8 hours.  Awoke her again 02/21/20. ended up in the ED after 24 hours of severe right-sided abdominal pain. Severe radiating abdominal pain with loose, mustardy stools. No fevers, chills, or night sweats.   WBC 15.5, urine ketones 20  CT abd/pelvis with contrast 02/21/20 showed Mild dilatation and mild wall thickening throughout the distal small bowel leading up to the ileocolic anastomosis in the medial right abdomen, with associated mild haziness of the right mesenteric fat. Findings are most suggestive of a nonspecific enteritis with associated mild ileus. Ileocolic anastomosis appears patent and intact.  Prescribed Augmentin, but she was afraid to take it as she needed different treatment for microscopic colitis  in the past.    Past Medical History:  Diagnosis Date  . Allergy   . Anemia    d/t bleeding ulcers  . Anxiety   . Arthritis   . Chronic constipation   . Colitis   . Depression   .  Esophagitis   . Fibroid   . Gastritis   . GERD (gastroesophageal reflux disease)   . H/O cold sores   . H/O removal of cyst 10/2018   right arm   . Headache   . Heart murmur    "when I was 18"  . Hormone replacement therapy (HRT) 11/14/2014  . Hot flashes 11/14/2014  . Increased endometrial stripe thickness 05/30/2015   Will get biopsy  . Insomnia   . Liver cyst   . Menopause 11/14/2014  . Moody 05/23/2015  . Nerves 11/14/2014  . Night sweats 11/14/2014  . PMB (postmenopausal bleeding) 05/23/2015  . Prolactinoma ([place])   . Rectocele 05/23/2015  . Rotator cuff syndrome of left shoulder 10/30/2010  . Stomach ulcer   . Upper GI bleed 11/01/2018   due to ulcers    Past Surgical History:  Procedure Laterality Date  . ABDOMINAL SURGERY    . APPENDECTOMY    . AUGMENTATION MAMMAPLASTY  2000   saline implants  . BIOPSY  11/01/2018   Procedure: BIOPSY;  Surgeon: Danie Binder, MD;  Location: [facility];  Service: Endoscopy;;  . [place]  . CARPAL TUNNEL RELEASE    . COLONOSCOPY  12/2018  . ESOPHAGOGASTRODUODENOSCOPY (EGD) WITH PROPOFOL N/A 11/01/2018   Procedure: ESOPHAGOGASTRODUODENOSCOPY (EGD) WITH PROPOFOL;  Surgeon: Danie Binder, MD;  Location: [facility];  Service: Endoscopy;  Laterality: N/A;  . LAPAROSCOPIC RIGHT HEMI COLECTOMY Right 02/27/2018   Procedure: LAPAROSCOPIC RIGHT HEMI COLECTOMY;  Surgeon: [name] Signs, MD;  Location: [facility] ORS;  Service: General;  Laterality: Right;  . LAPAROTOMY N/A 02/27/2018   Procedure: EXPLORATORY LAPAROTOMY;  Surgeon: [name] Signs, MD;  Location: [facility] ORS;  Service: General;  Laterality: N/A;  . TONSILECTOMY, ADENOIDECTOMY, BILATERAL MYRINGOTOMY AND TUBES    . UPPER GASTROINTESTINAL ENDOSCOPY      Current Outpatient Medications  Medication Sig Dispense Refill  . acetaminophen (TYLENOL) 325 MG tablet Take 2 tablets (650 mg total) by mouth every 6 (six) hours as needed for mild pain (or Fever >/= 101). 12 tablet 0   . amoxicillin-clavulanate (AUGMENTIN) 875-125 MG tablet Take 1 tablet by mouth every 12 (twelve) hours for 7 days. 14 tablet 0  . Caraway Oil-Levomenthol (FDGARD) 25-20.75 MG CAPS Use as directed; Lot#[id number], Exp date 07/2021 (Patient taking differently: Take 1 capsule by mouth as needed. Use as directed; Lot#[id number], Exp date 07/2021) 20 capsule 0  . cetirizine (ZYRTEC) 10 MG tablet Take 1 tablet (10 mg total) by mouth daily. 90 tablet 1  . Cholecalciferol (VITAMIN D-3) 25 MCG (1000 UT) CAPS Take 1,000 Units by mouth every morning.     . COLLAGEN PO Take 1 tablet by mouth every morning.     . diclofenac Sodium (VOLTAREN) 1 % GEL Apply 2 g topically daily as needed.    . fluticasone (FLONASE) 50 MCG/ACT nasal spray Place 2 sprays into both nostrils daily. 32 g 3  . gabapentin (NEURONTIN) 100 MG capsule Take 1 capsule (100 mg total) by mouth 3 (three) times daily. (Patient taking differently: Take 100 mg by mouth daily.) 90 capsule 0  . Omega-3 Fatty Acids (FISH OIL PO) Take 1 capsule by mouth every evening.  DHA and EFA-daily    . omeprazole (PRILOSEC) 40 MG capsule Take 1 capsule (40 mg total) by mouth 2 (two) times daily. 60 capsule 2  . PREMARIN vaginal cream Place 1 Applicatorful vaginally at bedtime.     . Prucalopride Succinate (MOTEGRITY) 2 MG TABS Take 1 tablet (2 mg total) by mouth daily. 30 tablet 2  . tretinoin (RETIN-A) 0.1 % cream Apply 1 application topically at bedtime.    . valACYclovir (VALTREX) 500 MG tablet TAKE 1 TABLET BY MOUTH  DAILY 90 tablet 3  . venlafaxine XR (EFFEXOR-XR) 150 MG 24 hr capsule Take 75 mg by mouth daily with breakfast.    . dicyclomine (BENTYL) 20 MG tablet Take 1 tablet (20 mg total) by mouth 3 (three) times daily. 90 tablet 2  . ondansetron (ZOFRAN) 4 MG tablet Take 1 tablet (4 mg total) by mouth every 6 (six) hours as needed for nausea or vomiting. 20 tablet 1   No current facility-administered medications for this visit.    Allergies as of  02/23/2020 - Review Complete 02/23/2020  Allergen Reaction Noted  . Flexeril [cyclobenzaprine] Other (See Comments) 12/19/2018  . Nsaids Other (See Comments) 11/01/2018  . Sulfa antibiotics  06/07/2019  . Xyzal [levocetirizine] Other (See Comments) 06/07/2019      Physical Exam: General:   Alert,  well-nourished, pleasant and cooperative in NAD Head:  Normocephalic and atraumatic. Eyes:  Sclera clear, no icterus.   Conjunctiva pink. Abdomen:  Soft, nontender, nondistended, normal bowel sounds, no rebound or guarding. No hepatosplenomegaly. No tympany.  Well-healed surgical scar with an associated ventral hernia.  Neurologic:  Alert and  oriented x4;  grossly nonfocal Skin:  Intact without significant lesions or rashes. Psych:  Alert and cooperative. Normal mood and affect.    Thera Basden L. Tarri Glenn, MD, MPH 02/23/2020, 5:49 PM

## 2020-02-23 NOTE — Addendum Note (Signed)
Addended by: Donell Beers on: 02/23/2020 05:51 PM   Modules accepted: Level of Service

## 2020-02-23 NOTE — Patient Instructions (Addendum)
RECOMMENDATION(S):   I did not make any changes to your medication regimen. At your request, I did send refills for Zofran and Bentyl.  FOLLOW UP:   Please call the office if your symptoms worsen and I will be happy to see you to address further.  BMI:  If you are age 58 or younger, your body mass index should be between 19-25. Your There is no height or weight on file to calculate BMI. If this is out of the aformentioned range listed, please consider follow up with your Primary Care Provider.   Thank you for trusting me with your gastrointestinal care!    Thornton Park, MD, MPH

## 2020-03-01 DIAGNOSIS — Z1231 Encounter for screening mammogram for malignant neoplasm of breast: Secondary | ICD-10-CM | POA: Diagnosis not present

## 2020-03-01 DIAGNOSIS — Z6822 Body mass index (BMI) 22.0-22.9, adult: Secondary | ICD-10-CM | POA: Diagnosis not present

## 2020-03-01 DIAGNOSIS — Z01419 Encounter for gynecological examination (general) (routine) without abnormal findings: Secondary | ICD-10-CM | POA: Diagnosis not present

## 2020-03-01 DIAGNOSIS — N951 Menopausal and female climacteric states: Secondary | ICD-10-CM | POA: Diagnosis not present

## 2020-03-28 ENCOUNTER — Other Ambulatory Visit: Payer: Self-pay | Admitting: Gastroenterology

## 2020-03-28 DIAGNOSIS — R112 Nausea with vomiting, unspecified: Secondary | ICD-10-CM

## 2020-03-28 DIAGNOSIS — K2101 Gastro-esophageal reflux disease with esophagitis, with bleeding: Secondary | ICD-10-CM

## 2020-04-02 ENCOUNTER — Ambulatory Visit: Payer: BC Managed Care – PPO | Admitting: Family Medicine

## 2020-04-02 ENCOUNTER — Encounter: Payer: Self-pay | Admitting: Family Medicine

## 2020-04-07 ENCOUNTER — Encounter: Payer: Self-pay | Admitting: Family Medicine

## 2020-04-09 DIAGNOSIS — M654 Radial styloid tenosynovitis [de Quervain]: Secondary | ICD-10-CM | POA: Diagnosis not present

## 2020-04-09 DIAGNOSIS — M25532 Pain in left wrist: Secondary | ICD-10-CM | POA: Diagnosis not present

## 2020-04-29 ENCOUNTER — Telehealth: Payer: Self-pay | Admitting: Gastroenterology

## 2020-04-29 NOTE — Telephone Encounter (Signed)
Inbound call from patient requesting a call from a nurse please.  States she may have an intestinal infection.  Please advise.

## 2020-04-29 NOTE — Telephone Encounter (Signed)
Pt is out of town, states on Friday she started having vomiting and diarrhea. States her stomach is very bloated. Today she is still nauseated and states her poop is a white/yellow color and her stomach is still very bloated. Pt wonders if she may have salmonella or giardia. Discussed with her she  might want to go to an urgent care . Pt verbalized understanding.

## 2020-05-31 ENCOUNTER — Other Ambulatory Visit: Payer: Self-pay | Admitting: Family Medicine

## 2020-06-07 DIAGNOSIS — M25532 Pain in left wrist: Secondary | ICD-10-CM | POA: Diagnosis not present

## 2020-06-07 DIAGNOSIS — M654 Radial styloid tenosynovitis [de Quervain]: Secondary | ICD-10-CM | POA: Diagnosis not present

## 2020-06-09 ENCOUNTER — Other Ambulatory Visit: Payer: Self-pay | Admitting: Gastroenterology

## 2020-06-09 DIAGNOSIS — K2101 Gastro-esophageal reflux disease with esophagitis, with bleeding: Secondary | ICD-10-CM

## 2020-06-09 DIAGNOSIS — R112 Nausea with vomiting, unspecified: Secondary | ICD-10-CM

## 2020-07-05 ENCOUNTER — Other Ambulatory Visit: Payer: Self-pay | Admitting: Gastroenterology

## 2020-07-05 DIAGNOSIS — K2101 Gastro-esophageal reflux disease with esophagitis, with bleeding: Secondary | ICD-10-CM

## 2020-07-05 DIAGNOSIS — R112 Nausea with vomiting, unspecified: Secondary | ICD-10-CM

## 2020-08-13 DIAGNOSIS — M654 Radial styloid tenosynovitis [de Quervain]: Secondary | ICD-10-CM | POA: Diagnosis not present

## 2020-09-04 DIAGNOSIS — M65132 Other infective (teno)synovitis, left wrist: Secondary | ICD-10-CM | POA: Diagnosis not present

## 2020-09-04 DIAGNOSIS — D2112 Benign neoplasm of connective and other soft tissue of left upper limb, including shoulder: Secondary | ICD-10-CM | POA: Diagnosis not present

## 2020-09-04 HISTORY — PX: WRIST SURGERY: SHX841

## 2020-09-05 ENCOUNTER — Ambulatory Visit: Payer: BC Managed Care – PPO | Admitting: Family Medicine

## 2020-09-19 DIAGNOSIS — M654 Radial styloid tenosynovitis [de Quervain]: Secondary | ICD-10-CM | POA: Diagnosis not present

## 2020-12-07 ENCOUNTER — Encounter: Payer: Self-pay | Admitting: Family Medicine

## 2021-02-24 ENCOUNTER — Other Ambulatory Visit: Payer: Self-pay

## 2021-02-24 ENCOUNTER — Ambulatory Visit (INDEPENDENT_AMBULATORY_CARE_PROVIDER_SITE_OTHER): Payer: BC Managed Care – PPO | Admitting: Adult Health

## 2021-02-24 ENCOUNTER — Other Ambulatory Visit (HOSPITAL_COMMUNITY)
Admission: RE | Admit: 2021-02-24 | Discharge: 2021-02-24 | Disposition: A | Discharge status: Home / Self Care | Payer: BC Managed Care – PPO | Source: Ambulatory Visit | Attending: Adult Health | Admitting: Adult Health

## 2021-02-24 ENCOUNTER — Encounter: Payer: Self-pay | Admitting: Adult Health

## 2021-02-24 VITALS — BP 108/75 | HR 58 | Ht 62.75 in | Wt 134.5 lb

## 2021-02-24 DIAGNOSIS — R232 Flushing: Secondary | ICD-10-CM

## 2021-02-24 DIAGNOSIS — R52 Pain, unspecified: Secondary | ICD-10-CM | POA: Insufficient documentation

## 2021-02-24 DIAGNOSIS — Z1329 Encounter for screening for other suspected endocrine disorder: Secondary | ICD-10-CM | POA: Diagnosis not present

## 2021-02-24 DIAGNOSIS — Z1211 Encounter for screening for malignant neoplasm of colon: Secondary | ICD-10-CM | POA: Diagnosis not present

## 2021-02-24 DIAGNOSIS — Z01419 Encounter for gynecological examination (general) (routine) without abnormal findings: Secondary | ICD-10-CM | POA: Diagnosis not present

## 2021-02-24 DIAGNOSIS — R61 Generalized hyperhidrosis: Secondary | ICD-10-CM

## 2021-02-24 DIAGNOSIS — Z13220 Encounter for screening for lipoid disorders: Secondary | ICD-10-CM | POA: Diagnosis not present

## 2021-02-24 DIAGNOSIS — R6882 Decreased libido: Secondary | ICD-10-CM

## 2021-02-24 DIAGNOSIS — Z780 Asymptomatic menopausal state: Secondary | ICD-10-CM

## 2021-02-24 DIAGNOSIS — F32A Depression, unspecified: Secondary | ICD-10-CM

## 2021-02-24 DIAGNOSIS — F419 Anxiety disorder, unspecified: Secondary | ICD-10-CM | POA: Insufficient documentation

## 2021-02-24 LAB — HEMOCCULT GUIAC POC 1CARD (OFFICE): Fecal Occult Blood, POC: NEGATIVE

## 2021-02-24 NOTE — Progress Notes (Signed)
Patient ID: Catherine Short, female   DOB: 03/09/1960, 61 y.o.   MRN: [id number] History of Present Illness: Catherine Short is a 61 year old white female, married, PM in for a well woman gyn exam and pap, and is complaining of body aches and hot flashes and nights sweats, and decreased libido. She is an active person but is struggling the last 6 months. She has had diarrhea lately and has seen some blood in toilet, seeing GI, next week. PCP is Catherine Limes NP.   Current Medications, Allergies, Past Medical History, Past Surgical History, Family History and Social History were reviewed in [employer] record.     Review of Systems: Patient denies any headaches, hearing loss, fatigue, blurred vision, shortness of breath, chest pain, abdominal pain, problems withurination, or intercourse. No j mood swings.  See HPI for positives.   Physical Exam:BP 108/75 (BP Location: Left Arm, Patient Position: Sitting, Cuff Size: Normal)    Pulse (!) 58    Ht 5' 2.75" (1.594 m)    Wt 134 lb 8 oz (61 kg)    LMP 07/05/2016 (Approximate) Comment: neg upt   BMI 24.02 kg/m   General:  Well developed, well nourished, no acute distress Skin:  Warm and dry Neck:  Midline trachea, normal thyroid, good ROM, no lymphadenopathy Lungs; Clear to auscultation bilaterally Breast:  No dominant palpable mass, retraction, or nipple discharge,has bilateral implants Cardiovascular: Regular rate and rhythm Abdomen:  Soft, non tender, no hepatosplenomegaly Pelvic:  External genitalia is normal in appearance, no lesions.  The vagina is pale but moist, has loss of rugae. Urethra has no lesions or masses. The cervix is smooth, pap with HR HPV genotyping performed.  Uterus is felt to be normal size, shape, and contour.  No adnexal masses or tenderness noted.Bladder is non tender, no masses felt. Rectal: Good sphincter tone, no polyps, or hemorrhoids felt.  Hemoccult negative. Extremities/musculoskeletal:  No swelling or  varicosities noted, no clubbing or cyanosis, she says finger and hands swell at night, has tenderness of ankles,knees, wrists and elbows, none on chins or spine Psych:  No mood changes, alert and cooperative,seems happy AA is 3 Fall risk is low Depression screen [facility] 2/9 02/24/2021 01/02/2020 01/02/2020  Decreased Interest 3 2 0  Down, Depressed, Hopeless 2 2 0  PHQ - 2 Score 5 4 0  Altered sleeping 3 1 -  Tired, decreased energy 3 2 -  Change in appetite 3 2 -  Feeling bad or failure about yourself  2 2 -  Trouble concentrating 1 1 -  Moving slowly or fidgety/restless 2 2 -  Suicidal thoughts 0 0 -  PHQ-9 Score 19 14 -  Difficult doing work/chores - Not difficult at all -    GAD 7 : Generalized Anxiety Score 02/24/2021 01/02/2020 06/07/2019 01/03/2019  Nervous, Anxious, on Edge 3 3 1 2   Control/stop worrying 3 3 1 2   Worry too much - different things 3 3 1 1   Trouble relaxing 2 3 1 1   Restless 2 3 1 3   Easily annoyed or irritable 3 3 1 1   Afraid - awful might happen 1 1 1 1   Total GAD 7 Score 17 19 7 11   Anxiety Difficulty - Not difficult at all Not difficult at all Not difficult at all  She is off meds  [facility] - 02/24/21 1024       Pregnancy Intention Screening   Does the patient want to become pregnant in the next year?  No    Does the patient's partner want to become pregnant in the next year? No    Would the patient like to discuss contraceptive options today? No      Contraception Wrap Up   Current Method Vasectomy    End Method Vasectomy    Contraception Counseling Provided No              Examination chaperoned by Levy Pupa LPN  Impression and Plan: 1. Encounter for gynecological examination with Papanicolaou smear of cervix Pap sent Pap in 3 years if normal Physical in 1 year  Get mammogram See GI next week Will check labs  - Cytology - PAP( [place]) - CBC - Comprehensive metabolic panel - TSH - Lipid panel  2. Encounter for screening fecal  occult blood testing Hemoccult negative  - POCT occult blood stool  3. Generalized body aches Will check labs and talk when results back, will rule out an RA,lupus thyroid issues  - TSH - Sedimentation rate - Antinuclear Antib (ANA) - Rheumatoid Factor  4. Hot flashes  - TSH - Estradiol  5. Night sweats  - Estradiol  6. Postmenopause Discussed some of complaints related to menopause - Estradiol  7. Decreased libido  - Estradiol  8. Screening cholesterol level  - Lipid panel  9. Screening for thyroid disorder  - TSH  10. Anxiety and depression Will wait til labs back, to discuss medications for this

## 2021-02-25 LAB — COMPREHENSIVE METABOLIC PANEL
ALT: 21 IU/L (ref 0–32)
AST: 22 IU/L (ref 0–40)
Albumin/Globulin Ratio: 2.6 — ABNORMAL HIGH (ref 1.2–2.2)
Albumin: 4.6 g/dL (ref 3.8–4.9)
Alkaline Phosphatase: 49 IU/L (ref 44–121)
BUN/Creatinine Ratio: 23 (ref 9–23)
BUN: 18 mg/dL (ref 6–24)
Bilirubin Total: 0.8 mg/dL (ref 0.0–1.2)
CO2: 26 mmol/L (ref 20–29)
Calcium: 9.7 mg/dL (ref 8.7–10.2)
Chloride: 103 mmol/L (ref 96–106)
Creatinine, Ser: 0.77 mg/dL (ref 0.57–1.00)
Globulin, Total: 1.8 g/dL (ref 1.5–4.5)
Glucose: 97 mg/dL (ref 70–99)
Potassium: 4.4 mmol/L (ref 3.5–5.2)
Sodium: 141 mmol/L (ref 134–144)
Total Protein: 6.4 g/dL (ref 6.0–8.5)
eGFR: 89 mL/min/{1.73_m2} (ref >59)

## 2021-02-25 LAB — CBC
Hematocrit: 42.9 % (ref 34.0–46.6)
Hemoglobin: 14.4 g/dL (ref 11.1–15.9)
MCH: 31.8 pg (ref 26.6–33.0)
MCHC: 33.6 g/dL (ref 31.5–35.7)
MCV: 95 fL (ref 79–97)
Platelets: 243 10*3/uL (ref 150–450)
RBC: 4.53 x10E6/uL (ref 3.77–5.28)
RDW: 12.2 % (ref 11.7–15.4)
WBC: 5.6 10*3/uL (ref 3.4–10.8)

## 2021-02-25 LAB — LIPID PANEL
Chol/HDL Ratio: 2.2 ratio (ref 0.0–4.4)
Cholesterol, Total: 185 mg/dL (ref 100–199)
HDL: 86 mg/dL (ref >39)
LDL Chol Calc (NIH): 88 mg/dL (ref 0–99)
Triglycerides: 57 mg/dL (ref 0–149)
VLDL Cholesterol Cal: 11 mg/dL (ref 5–40)

## 2021-02-25 LAB — TSH: TSH: 1.2 u[IU]/mL (ref 0.450–4.500)

## 2021-02-25 LAB — RHEUMATOID FACTOR: Rheumatoid fact SerPl-aCnc: 10.3 IU/mL (ref <14.0)

## 2021-02-25 LAB — ANA: Anti Nuclear Antibody (ANA): NEGATIVE

## 2021-02-25 LAB — ESTRADIOL: Estradiol: <5 pg/mL

## 2021-02-25 LAB — SEDIMENTATION RATE: Sed Rate: 3 mm/hr (ref 0–40)

## 2021-02-26 LAB — CYTOLOGY - PAP
Comment: NEGATIVE
Diagnosis: UNDETERMINED — AB
High risk HPV: NEGATIVE

## 2021-02-28 ENCOUNTER — Encounter: Payer: Self-pay | Admitting: Adult Health

## 2021-02-28 DIAGNOSIS — R87610 Atypical squamous cells of undetermined significance on cytologic smear of cervix (ASC-US): Secondary | ICD-10-CM | POA: Insufficient documentation

## 2021-02-28 HISTORY — DX: Atypical squamous cells of undetermined significance on cytologic smear of cervix (ASC-US): R87.610

## 2021-03-03 ENCOUNTER — Other Ambulatory Visit: Payer: BC Managed Care – PPO

## 2021-03-03 ENCOUNTER — Other Ambulatory Visit: Payer: Self-pay | Admitting: Adult Health

## 2021-03-03 ENCOUNTER — Ambulatory Visit (INDEPENDENT_AMBULATORY_CARE_PROVIDER_SITE_OTHER): Payer: BC Managed Care – PPO | Admitting: Gastroenterology

## 2021-03-03 ENCOUNTER — Encounter: Payer: Self-pay | Admitting: Gastroenterology

## 2021-03-03 VITALS — BP 110/64 | HR 56 | Ht 62.75 in | Wt 136.2 lb

## 2021-03-03 DIAGNOSIS — K219 Gastro-esophageal reflux disease without esophagitis: Secondary | ICD-10-CM

## 2021-03-03 DIAGNOSIS — R197 Diarrhea, unspecified: Secondary | ICD-10-CM | POA: Diagnosis not present

## 2021-03-03 DIAGNOSIS — K2101 Gastro-esophageal reflux disease with esophagitis, with bleeding: Secondary | ICD-10-CM | POA: Diagnosis not present

## 2021-03-03 DIAGNOSIS — R933 Abnormal findings on diagnostic imaging of other parts of digestive tract: Secondary | ICD-10-CM

## 2021-03-03 DIAGNOSIS — K589 Irritable bowel syndrome without diarrhea: Secondary | ICD-10-CM

## 2021-03-03 DIAGNOSIS — K529 Noninfective gastroenteritis and colitis, unspecified: Secondary | ICD-10-CM

## 2021-03-03 MED ORDER — CLIMARA PRO 0.045-0.015 MG/DAY TD PTWK: 1.0000 | MEDICATED_PATCH | TRANSDERMAL | 12 refills | Status: DC

## 2021-03-03 MED ORDER — OMEPRAZOLE 40 MG PO CPDR: 40.0000 mg | DELAYED_RELEASE_CAPSULE | Freq: Two times a day (BID) | ORAL | 3 refills | Status: DC

## 2021-03-03 MED ORDER — DICYCLOMINE HCL 20 MG PO TABS: 20.0000 mg | ORAL_TABLET | Freq: Three times a day (TID) | ORAL | 3 refills | Status: DC

## 2021-03-03 NOTE — Progress Notes (Signed)
Reviewed.  Dennise Raabe L. Nathifa Ritthaler, MD, MPH  

## 2021-03-03 NOTE — Progress Notes (Signed)
Rx climara pro

## 2021-03-03 NOTE — Progress Notes (Signed)
03/03/2021 Catherine Short [id number] 1960/05/26   HISTORY OF PRESENT ILLNESS: Ms. is a 60 year old female who is a patient of Dr. Tarri Glenn.  Longstanding she has history of constipation that has been treated with multiple regimens.  She has a history of microscopic colitis diagnosed in 2011 (Dr. Deatra Ina) and treated with Doristine Johns but had not been on any treatment since that time, cecal volvulus requiring right hemicolectomy in 02/2018 at [facility], hospitalization for SBO 8/20, NSAID induced gastric ulcers on EGD 10/20 (Dr. Oneida Alar) with follow-up EGD showing documented healing.  Colonoscopy 12/19/2018 showed end-to-end ileocolonic anastomosis and was otherwise normal.  Repeat colonoscopy recommended in 10 years.  She is here today with complaints of diarrhea.  She said that this has been going on for about the past 3 to 4 months.  Obviously is not using any of her constipation regimens.  She says from the morning to the mid afternoon she is having stools about 5 times.  Stools have had no form in the past few months since this started.  She says that a lot of times the stools are sediment and sometimes frothy and jelly looking.  She also complains of some crampy left mid abdominal pain.  She denies any nausea or vomiting.  No antibiotic use for months that she can recall.  She does have dogs, 1 of which brings in partially eaten animals from outside, etc.  She denies any rectal bleeding.    She is requesting refills on her Bentyl and her omeprazole.  Recent labs last week by her GYN including sed rate, TSH, CBC, CMP, and fecal occult blood were all normal/negative.   Past Medical History:  Diagnosis Date   Allergy    Anemia    d/t bleeding ulcers   Anxiety    Arthritis    ASCUS of cervix with negative high risk HPV 02/28/2021   02/28/21 repeat in 3 years per [facility], 5 year CIN3+ risk is 0.27%   Chronic constipation    Colitis    Depression    Esophagitis    Fibroid    Gastritis    GERD  (gastroesophageal reflux disease)    H/O cold sores    H/O removal of cyst 10/2018   right arm    Headache    Heart murmur    "when I was 18"   Hormone replacement therapy (HRT) 11/14/2014   Hot flashes 11/14/2014   Increased endometrial stripe thickness 05/30/2015   Will get biopsy   Insomnia    Liver cyst    Menopause 11/14/2014   Moody 05/23/2015   Nerves 11/14/2014   Night sweats 11/14/2014   PMB (postmenopausal bleeding) 05/23/2015   Prolactinoma ([place])    Rectocele 05/23/2015   Rotator cuff syndrome of left shoulder 10/30/2010   Stomach ulcer    Upper GI bleed 11/01/2018   due to ulcers   Past Surgical History:  Procedure Laterality Date   ABDOMINAL SURGERY     APPENDECTOMY     AUGMENTATION MAMMAPLASTY  2000   saline implants   BIOPSY  11/01/2018   Procedure: BIOPSY;  Surgeon: Danie Binder, MD;  [facility];  Service: Endoscopy;;   BREAST ENHANCEMENT SURGERY  1996   CARPAL TUNNEL RELEASE     COLONOSCOPY  12/2018   ESOPHAGOGASTRODUODENOSCOPY (EGD) WITH PROPOFOL N/A 11/01/2018   Procedure: ESOPHAGOGASTRODUODENOSCOPY (EGD) WITH PROPOFOL;  Surgeon: Danie Binder, MD;  [facility];  Service: Endoscopy;  Laterality: N/A;  LAPAROSCOPIC RIGHT HEMI COLECTOMY Right 02/27/2018   Procedure: LAPAROSCOPIC RIGHT HEMI COLECTOMY;  Surgeon: [name] Signs, MD;  Location: [facility];  Service: General;  Laterality: Right;   LAPAROTOMY N/A 02/27/2018   Procedure: EXPLORATORY LAPAROTOMY;  Surgeon: [name] Signs, MD;  Location: [facility];  Service: General;  Laterality: N/A;   TONSILECTOMY, ADENOIDECTOMY, BILATERAL MYRINGOTOMY AND TUBES     UPPER GASTROINTESTINAL ENDOSCOPY     WRIST SURGERY Left 09/04/2020    reports that she quit smoking about 28 years ago. Her smoking use included cigarettes. She has never used smokeless tobacco. She reports current alcohol use of about 2.0 standard drinks per week. She reports that she does not use drugs. family history includes Alcohol  abuse in her brother; Colon cancer in her paternal grandfather; Colon polyps in her maternal grandfather; Diabetes in her mother; Early death in her brother; Emphysema in her maternal grandfather; Heart attack in her brother and father; Heart disease in her father; Hypertension in her mother; Other in her mother; Uterine cancer in her maternal grandmother. Allergies  Allergen Reactions   Flexeril [Cyclobenzaprine] Other (See Comments)    disoriented   Nsaids Other (See Comments)    Avoid use due to gastric ulcers.    Sulfa Antibiotics    Xyzal [Levocetirizine] Other (See Comments)    Headache      Outpatient Encounter Medications as of 03/03/2021  Medication Sig   acetaminophen (TYLENOL) 325 MG tablet Take 2 tablets (650 mg total) by mouth every 6 (six) hours as needed for mild pain (or Fever >/= 101).   Amino Acids (AMINO ACID PO) Take by mouth.   B Complex Vitamins (B COMPLEX PO) Take by mouth.   Calcium Carbonate-Vit D-Min (CALTRATE PLUS PO) Take by mouth.   Caraway Oil-Levomenthol (FDGARD) 25-20.75 MG CAPS Use as directed; Lot#[id number], Exp date 07/2021 (Patient taking differently: Take 1 capsule by mouth as needed. Use as directed; Lot#[id number], Exp date 07/2021)   cetirizine (ZYRTEC) 10 MG tablet Take 1 tablet (10 mg total) by mouth daily.   COLLAGEN PO Take 1 tablet by mouth every morning.    diclofenac Sodium (VOLTAREN) 1 % GEL Apply 2 g topically daily as needed.   dicyclomine (BENTYL) 20 MG tablet Take 1 tablet (20 mg total) by mouth 3 (three) times daily.   fluticasone (FLONASE) 50 MCG/ACT nasal spray Place 2 sprays into both nostrils daily.   Linoleic Acid-Sunflower Oil (CLA PO) Take by mouth.   Multiple Minerals-Vitamins (MULTI MEGA MINERALS PO) Take by mouth.   Nutritional Supplements (DHEA PO) Take 50 mg by mouth.   Omega-3 Fatty Acids (FISH OIL PO) Take 1 capsule by mouth every evening. DHA and EFA-daily   omeprazole (PRILOSEC) 40 MG capsule Take 1 capsule by mouth twice  daily   ondansetron (ZOFRAN) 4 MG tablet Take 1 tablet (4 mg total) by mouth every 6 (six) hours as needed for nausea or vomiting.   tretinoin (RETIN-A) 0.1 % cream Apply 1 application topically at bedtime.   UNABLE TO FIND Woman's DIM complex;Gaba 750 mg prn   valACYclovir (VALTREX) 500 MG tablet TAKE 1 TABLET BY MOUTH  DAILY   No facility-administered encounter medications on file as of 03/03/2021.    REVIEW OF SYSTEMS  : All other systems reviewed and negative except where noted in the History of Present Illness.   PHYSICAL EXAM: BP 110/64    Pulse (!) 56    Ht 5' 2.75" (1.594 m)    Wt 136 lb 3.2 oz (  61.8 kg)    LMP 07/05/2016 (Approximate) Comment: neg upt   BMI 24.32 kg/m  General: Well developed white female in no acute distress Head: Normocephalic and atraumatic Eyes:  Sclerae anicteric, conjunctiva pink. Ears: Normal auditory acuity Lungs: Clear throughout to auscultation; no W/R/R Heart: Slightly bradycardic; no M/R/G. Abdomen: Soft, non-distended.  BS present.  Minimal left sided abdominal TTP.  Well healed scar noted from previous surgery. Musculoskeletal: Symmetrical with no gross deformities  Skin: No lesions on visible extremities Extremities: No edema  Neurological: Alert oriented x 4, grossly non-focal Psychological:  Alert and cooperative. Normal mood and affect  ASSESSMENT AND PLAN: *Acute/subacute diarrhea: Present for the past 3 to 4 months.  Has associated left-sided abdominal cramping as well.  TSH an sed rate normal last week. *Longstanding history of IBS-C: Not currently using her constipation regimens. *Remote history of microscopic colitis diagnosed in 2011, treated with Lialda at that time *Chronic nausea thought possibly related to her constipation issues *History of iron deficiency anemia, most recent labs were normal.  Hemoccult negative. *History of cecal volvulus status post right hemicolectomy 02/2018 *Small bowel obstruction due to adhesions,  hospitalized at [facility] 08/2018 *History of NSAID related gastric ulcers, resolved on follow-up EGD *Episode of acute enteritis with associated ileus presenting with pain, but no diarrhea.  Seen on CT scan at [facility] in 02/2020 *GERD: On omeprazole 40 mg twice daily.  -Certainly her diarrhea could represent recurrence of her microscopic colitis.  I think that we need to rule out infectious source first, however.  We will check a C. difficile and ova and parasites at her request as she does have dogs, etc.  I am also going to check a fecal calprotectin and pancreatic fecal elastase. -If these are negative then we will start budesonide. -She could also have a component of SIBO, but will reserve treatment and testing if she does not respond to budesonide therapy. -We will refill Bentyl and omeprazole prescriptions at her request. -Follow-up pending results.  CC:  Loman Brooklyn, FNP

## 2021-03-03 NOTE — Patient Instructions (Signed)
If you are age 59 or older, your body mass index should be between 23-30. Your Body mass index is 24.32 kg/m. If this is out of the aforementioned range listed, please consider follow up with your Primary Care Provider.  If you are age 71 or younger, your body mass index should be between 19-25. Your Body mass index is 24.32 kg/m. If this is out of the aformentioned range listed, please consider follow up with your Primary Care Provider.   Your provider has requested that you go to the basement level for lab work before leaving today. Press "B" on the elevator. The lab is located at the first door on the left as you exit the elevator.   We have sent the following medications to your pharmacy for you to pick up at your convenience: Omeprazole 40 mg , Dicyclomine 20 mg.  The [place] [facility] providers would like to encourage you to use [facility] to communicate with providers for non-urgent requests or questions.  Due to long hold times on the telephone, sending your provider a message by [facility] may be a faster and more efficient way to get a response.  Please allow 48 business hours for a response.  Please remember that this is for non-urgent requests.   It was a pleasure to see you today!  Thank you for trusting me with your gastrointestinal care!    Alonza Bogus, PA-C

## 2021-03-04 ENCOUNTER — Other Ambulatory Visit: Payer: BC Managed Care – PPO

## 2021-03-04 DIAGNOSIS — K589 Irritable bowel syndrome without diarrhea: Secondary | ICD-10-CM

## 2021-03-04 DIAGNOSIS — K2101 Gastro-esophageal reflux disease with esophagitis, with bleeding: Secondary | ICD-10-CM

## 2021-03-04 DIAGNOSIS — R197 Diarrhea, unspecified: Secondary | ICD-10-CM

## 2021-03-04 DIAGNOSIS — K219 Gastro-esophageal reflux disease without esophagitis: Secondary | ICD-10-CM | POA: Diagnosis not present

## 2021-03-04 DIAGNOSIS — R933 Abnormal findings on diagnostic imaging of other parts of digestive tract: Secondary | ICD-10-CM

## 2021-03-04 DIAGNOSIS — K529 Noninfective gastroenteritis and colitis, unspecified: Secondary | ICD-10-CM

## 2021-03-05 LAB — CLOSTRIDIUM DIFFICILE BY PCR: Toxigenic C. Difficile by PCR: NEGATIVE

## 2021-03-06 ENCOUNTER — Other Ambulatory Visit: Payer: Self-pay

## 2021-03-06 MED ORDER — BUDESONIDE 3 MG PO CPEP: 9.0000 mg | ORAL_CAPSULE | Freq: Every day | ORAL | 1 refills | Status: DC

## 2021-03-07 ENCOUNTER — Telehealth: Payer: Self-pay

## 2021-03-07 LAB — OVA AND PARASITE EXAMINATION
CONCENTRATE RESULT:: NONE SEEN
MICRO NUMBER:: 13067440
SPECIMEN QUALITY:: ADEQUATE
TRICHROME RESULT:: NONE SEEN

## 2021-03-07 LAB — PANCREATIC ELASTASE, FECAL

## 2021-03-07 NOTE — Telephone Encounter (Signed)
-----   Message from Loralie Champagne, PA-C sent at 03/07/2021  2:46 PM EST ----- ?Can you check on the pancreatic fecal elastase? ? ?----- Message ----- ?From: Interface, [facility] Lab Results In ?Sent: 03/05/2021   4:37 PM EST ?To: Laban Emperor Zehr, PA-C ? ? ?

## 2021-03-07 NOTE — Telephone Encounter (Signed)
I called back to [facility] and spoke with [name] Kitchen she said they gave me the incorrect  information regarding the pancreatic elastase. They gave me the status for the calprotectin.  She was not able to tell me why the elastase was cancelled.  She could not find it in her system at all.  I faxed over the requisition for her to review and research.  She will call back when she gets an answer.   ?

## 2021-03-07 NOTE — Telephone Encounter (Signed)
I called [facility] and the test should result over the weekend.  ?

## 2021-03-09 LAB — CALPROTECTIN, FECAL: Calprotectin, Fecal: <16 ug/g (ref 0–120)

## 2021-03-24 ENCOUNTER — Telehealth: Payer: Self-pay | Admitting: Adult Health

## 2021-03-24 NOTE — Telephone Encounter (Signed)
Catherine Short see the notes from the pt.  I called the lab again and could not get an answer as to why the stool test was not completed.  Please advise  ?

## 2021-03-24 NOTE — Telephone Encounter (Signed)
Patient would like you or your nurse to call her.  ?

## 2021-03-24 NOTE — Telephone Encounter (Signed)
Pt has started having vaginal bleeding. Had not had a period in 10 years. Scheduled an appointment for patient on Wednesday at 410.  ?

## 2021-03-25 ENCOUNTER — Other Ambulatory Visit: Payer: Self-pay

## 2021-03-25 DIAGNOSIS — K589 Irritable bowel syndrome without diarrhea: Secondary | ICD-10-CM

## 2021-03-25 DIAGNOSIS — R197 Diarrhea, unspecified: Secondary | ICD-10-CM

## 2021-03-26 ENCOUNTER — Ambulatory Visit (INDEPENDENT_AMBULATORY_CARE_PROVIDER_SITE_OTHER): Payer: BC Managed Care – PPO | Admitting: Adult Health

## 2021-03-26 ENCOUNTER — Other Ambulatory Visit: Payer: Self-pay

## 2021-03-26 ENCOUNTER — Encounter: Payer: Self-pay | Admitting: Adult Health

## 2021-03-26 VITALS — BP 117/75 | HR 66 | Ht 62.75 in | Wt 142.0 lb

## 2021-03-26 DIAGNOSIS — Z79890 Hormone replacement therapy (postmenopausal): Secondary | ICD-10-CM | POA: Insufficient documentation

## 2021-03-26 DIAGNOSIS — F419 Anxiety disorder, unspecified: Secondary | ICD-10-CM | POA: Diagnosis not present

## 2021-03-26 DIAGNOSIS — F32A Depression, unspecified: Secondary | ICD-10-CM | POA: Diagnosis not present

## 2021-03-26 DIAGNOSIS — N950 Postmenopausal bleeding: Secondary | ICD-10-CM

## 2021-03-26 MED ORDER — SERTRALINE HCL 50 MG PO TABS: 50.0000 mg | ORAL_TABLET | Freq: Every day | ORAL | 3 refills | Status: DC

## 2021-03-26 NOTE — Progress Notes (Signed)
?  Subjective:  ?  ? Patient ID: Catherine Short, female   DOB: 1960-06-24, 61 y.o.   MRN: [id number] ? ?HPI ?Catherine Short is a 61 year old white female,married, PM started on climara pro in February and has had bleeding for aboit 4 days, light. Patches are wanting to come off.  ?PCP is Catherine Sizer NP ?Lab Results  ?Component Value Date  ? DIAGPAP (A) 02/24/2021  ?  - Atypical squamous cells of undetermined significance (ASC-US)  ? HPV NOT DETECTED 08/19/2016  ? [place] Negative 02/24/2021  ?  ? ?Review of Systems ?Light vaginal bleeding for 4 days ?Body aches better ?Night sweats better ?Still with decreased libido ?+anxiety ?Reviewed past medical,surgical, social and family history. Reviewed medications and allergies.  ?   ?Objective:  ? Physical Exam ?BP 117/75 (BP Location: Right Arm, Patient Position: Sitting, Cuff Size: Normal)   Pulse 66   Ht 5' 2.75" (1.594 m)   Wt 142 lb (64.4 kg)   LMP 07/05/2016 (Approximate) Comment: neg upt  BMI 25.36 kg/m?   ?  Skin warm and dry.Pelvic: external genitalia is normal in appearance no lesions, vagina: light blood,urethra has no lesions or masses noted, cervix:smooth and bulbous, uterus: normal size, shape and contour, non tender, no masses felt, adnexa: no masses or tenderness noted. Bladder is non tender and no masses felt.  ? Upstream - 03/26/21 1616   ? ?  ? Pregnancy Intention Screening  ? Does the patient want to become pregnant in the next year? No   ? Does the patient's partner want to become pregnant in the next year? No   ? Would the patient like to discuss contraceptive options today? No   ?  ? Contraception Wrap Up  ? Current Method Vasectomy   ? End Method Vasectomy   ? Contraception Counseling Provided No   ? ?  ?  ? ?  ? Examination chaperoned by Balinda Quails NP student  ?Assessment:  ?   ?1. PMB (postmenopausal bleeding) ?Will get pelvic US 03/31/21 at [facility] to assess uterine lining ?Will talk when results back  ? ?2. Hormone replacement therapy (HRT) ?Will  continue climara pro ?Try placing patch in different area ? ?3. Anxiety and depression ?Will try Zoloft 50 mg 1 daily, has tried others in the past, that did not work  ?Meds ordered this encounter  ?Medications  ? sertraline (ZOLOFT) 50 MG tablet  ?  Sig: Take 1 tablet (50 mg total) by mouth daily.  ?  Dispense:  30 tablet  ?  Refill:  3  ?  Order Specific Question:   Supervising Provider  ?  Answer:   Tania Ade H [[id number]]  ?  ?   ?Plan:  ?   ?Follow up in 8 weeks for ROS ?   ?

## 2021-03-27 ENCOUNTER — Other Ambulatory Visit: Payer: Self-pay | Admitting: Adult Health

## 2021-03-27 MED ORDER — BUPROPION HCL ER (XL) 150 MG PO TB24: 150.0000 mg | ORAL_TABLET | ORAL | 3 refills | Status: DC

## 2021-03-27 NOTE — Progress Notes (Signed)
Will d/c Zoloft and rx Wellbutrin  ?

## 2021-03-31 ENCOUNTER — Other Ambulatory Visit: Payer: BC Managed Care – PPO

## 2021-03-31 ENCOUNTER — Ambulatory Visit (HOSPITAL_COMMUNITY): Payer: BC Managed Care – PPO

## 2021-03-31 DIAGNOSIS — K589 Irritable bowel syndrome without diarrhea: Secondary | ICD-10-CM

## 2021-03-31 DIAGNOSIS — R197 Diarrhea, unspecified: Secondary | ICD-10-CM | POA: Diagnosis not present

## 2021-04-08 LAB — PANCREATIC ELASTASE, FECAL: Pancreatic Elastase-1, Stool: >500 mcg/g

## 2021-04-17 DIAGNOSIS — Z1231 Encounter for screening mammogram for malignant neoplasm of breast: Secondary | ICD-10-CM | POA: Diagnosis not present

## 2021-04-22 ENCOUNTER — Other Ambulatory Visit: Payer: Self-pay | Admitting: Gastroenterology

## 2021-05-02 ENCOUNTER — Other Ambulatory Visit: Payer: Self-pay | Admitting: Adult Health

## 2021-05-02 MED ORDER — NORETHINDRONE ACETATE 5 MG PO TABS: 2.5000 mg | ORAL_TABLET | Freq: Every day | ORAL | 0 refills | Status: DC

## 2021-05-02 NOTE — Progress Notes (Signed)
Will add 2.5 mg aygestin ?

## 2021-05-06 ENCOUNTER — Ambulatory Visit: Payer: BC Managed Care – PPO | Admitting: Gastroenterology

## 2021-05-10 IMAGING — CT CT ABD-PELV W/ CM
2 of 5 series · 16 of 46 positions shown, 18 images · IV contrast (Omnipaque or Isovue)
Comparison: 10/31/2018 CT abdomen/pelvis.

CLINICAL DATA: Right abdominal pain, nausea and diarrhea for 2 days
with abdominal distention.

EXAM:
CT ABDOMEN AND PELVIS WITH CONTRAST
TECHNIQUE: Multidetector CT imaging of the abdomen and pelvis was performed
using the standard protocol following bolus administration of
intravenous contrast.
CONTRAST:  100mL OMNIPAQUE IOHEXOL 300 MG/ML  SOLN

[Series 2: axial st · axial · 0.82mm/px · z∈[+779,+1139]mm · 13 of 82 slices shown, 15 images]
[im 5/82  soft-tissue]
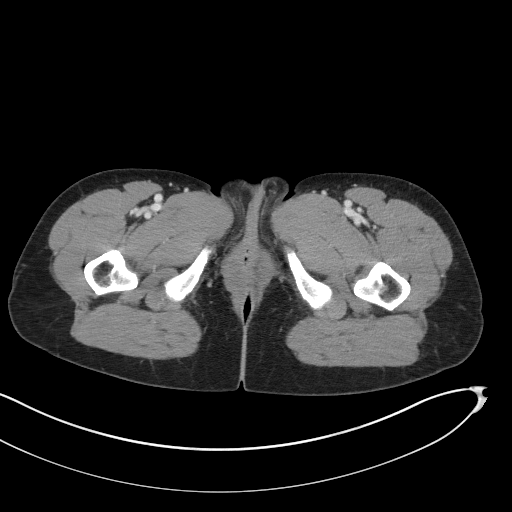
[im 5/82  bone]
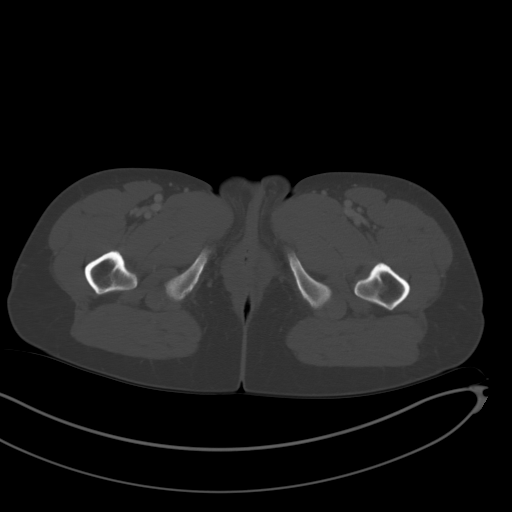
[im 10/82  soft-tissue]
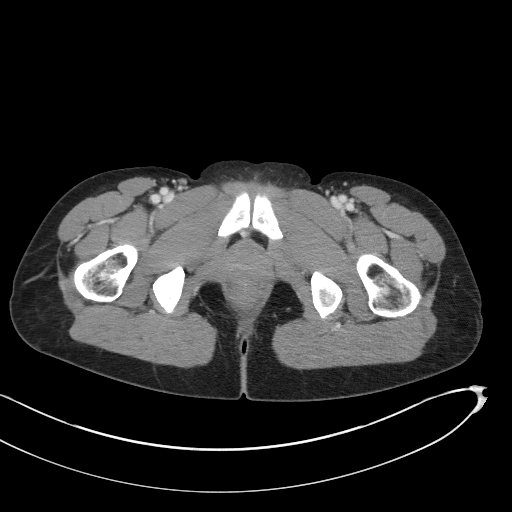
[im 19/82  soft-tissue]
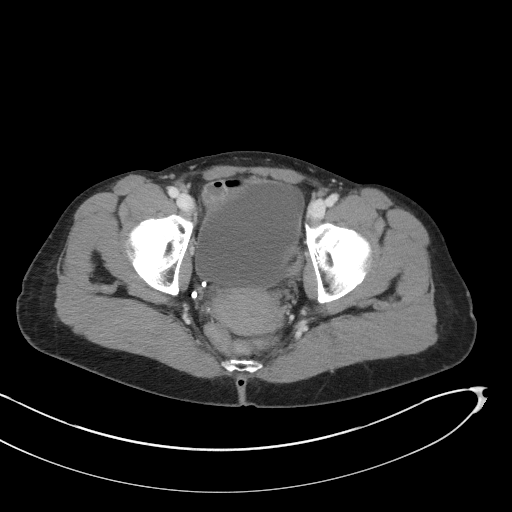
[im 23/82  soft-tissue]
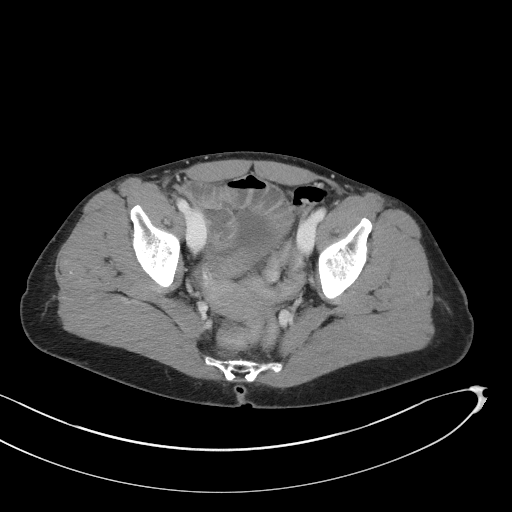
[im 28/82  soft-tissue]
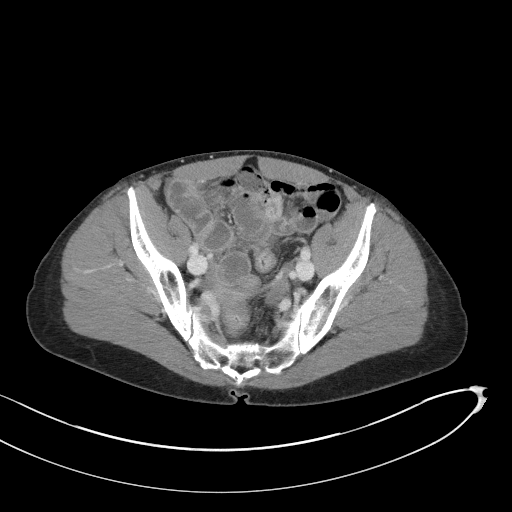
[im 37/82  soft-tissue]
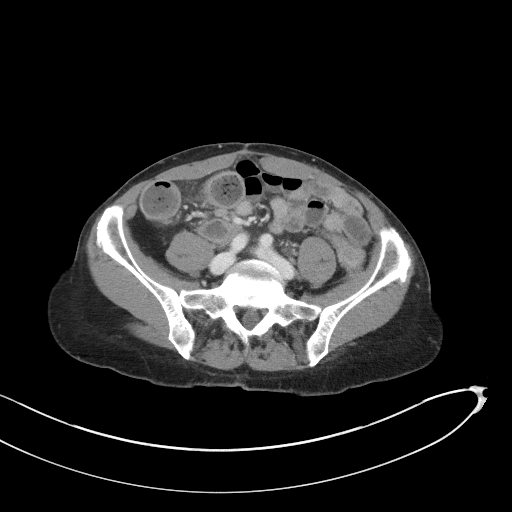
[im 41/82  soft-tissue]
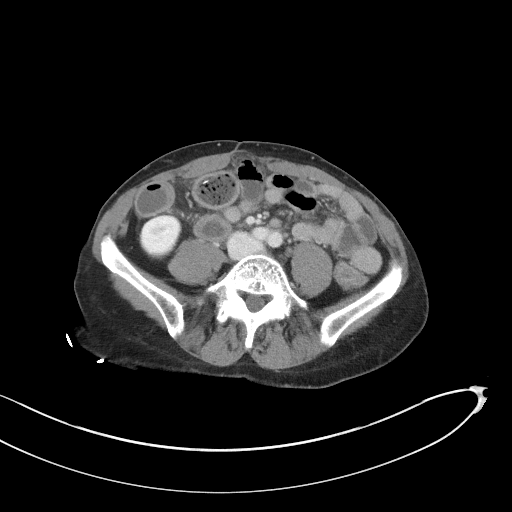
[im 46/82  soft-tissue]
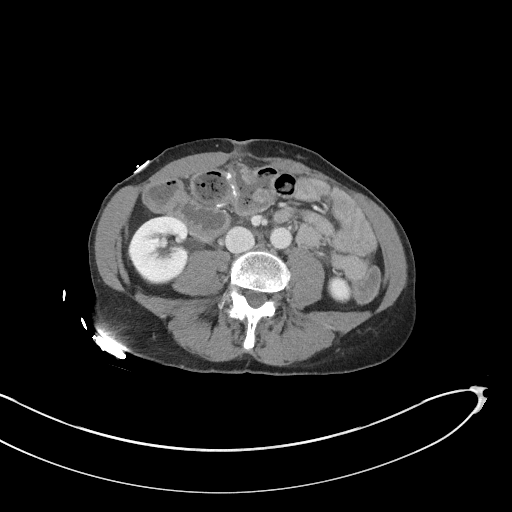
[im 55/82  soft-tissue]
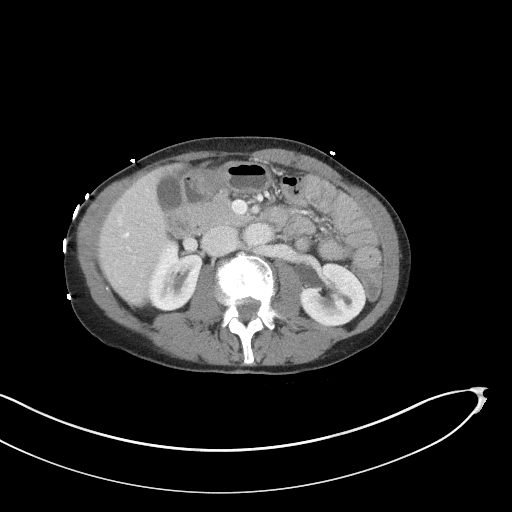
[im 55/82  bone]
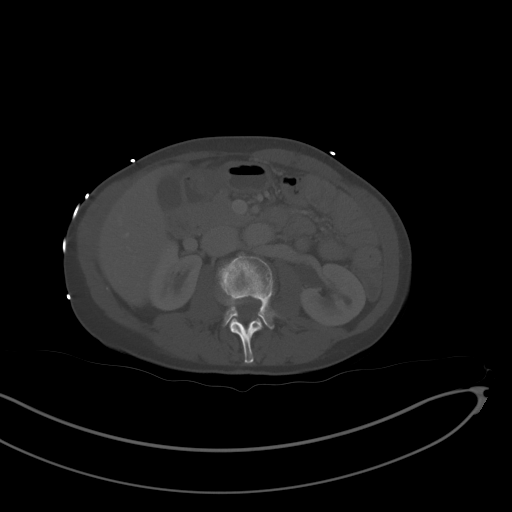
[im 59/82  soft-tissue]
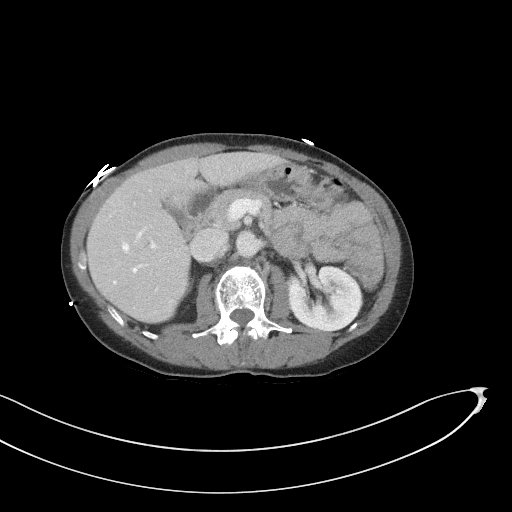
[im 64/82  soft-tissue]
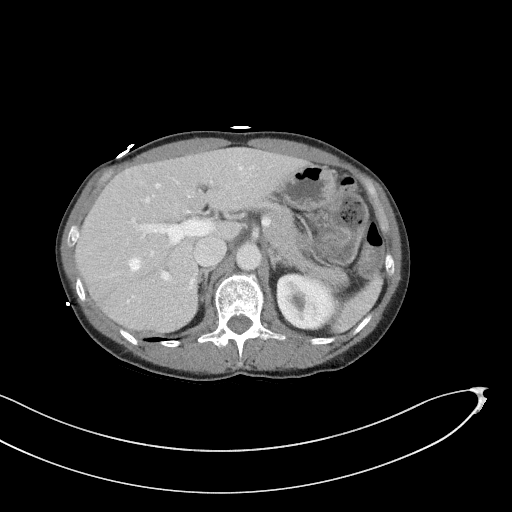
[im 73/82  soft-tissue]
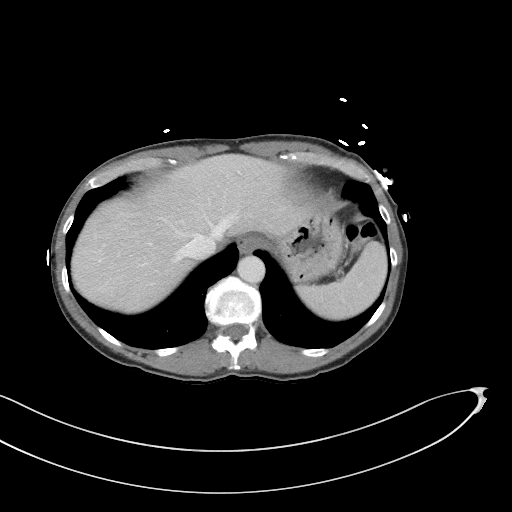
[im 77/82  soft-tissue]
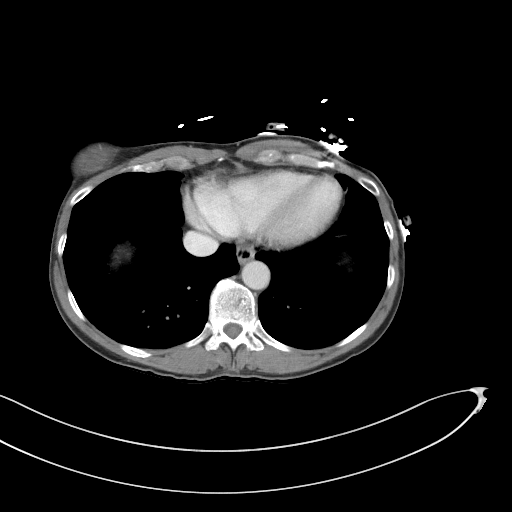

[Series 5: coronal st · coronal · 0.71mm/px · 3 of 88 slices shown]
[im 30/88  soft-tissue]
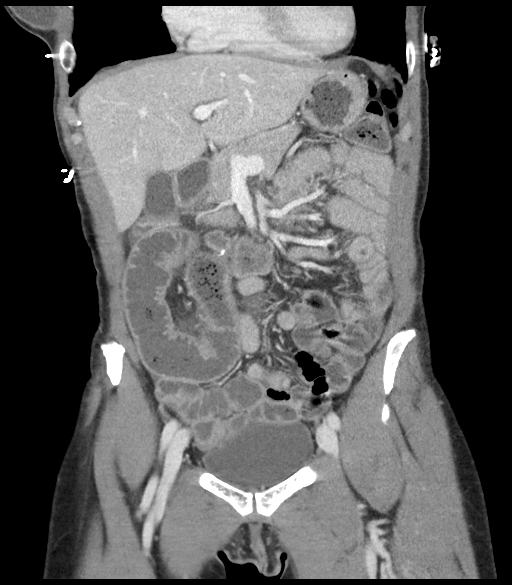
[im 39/88  soft-tissue]
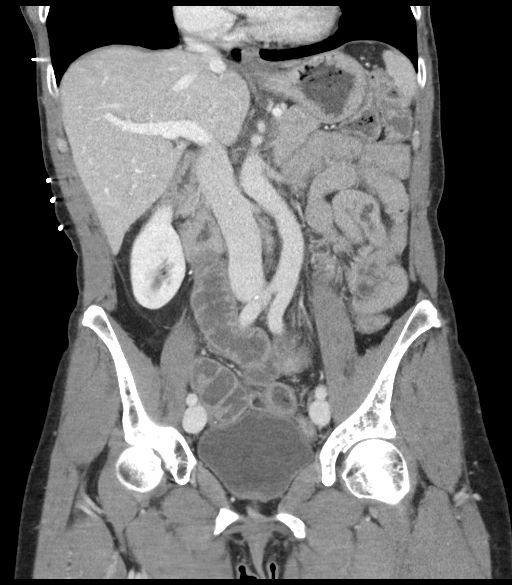
[im 49/88  soft-tissue]
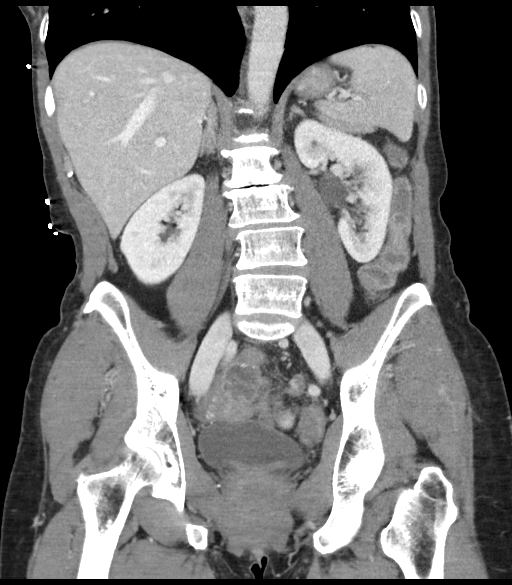

[16 of 46 positions shown; findings below may reference images not displayed]

FINDINGS: Lower chest: No significant pulmonary nodules or acute consolidative
airspace disease. Partially visualized bilateral breast prostheses.

Hepatobiliary: Normal liver size. Simple 1.5 cm posteroinferior
right liver cyst. A few scattered tiny subcentimeter hypodense liver
lesions are too small to characterize and appear unchanged,
presumably benign. No new liver lesions. Normal gallbladder with no
radiopaque cholelithiasis. No biliary ductal dilatation.

Pancreas: Normal, with no mass or duct dilation.

Spleen: Normal size. No mass.

Adrenals/Urinary Tract: Normal adrenals. Few scattered subcentimeter
hypodense renal cortical lesions in both kidneys, too small to
characterize, unchanged, considered benign. Otherwise normal
kidneys, with no hydronephrosis. Normal bladder.

Stomach/Bowel: Normal non-distended stomach. Postsurgical changes
from right hemicolectomy with intact and patent appearing ileocolic
anastomosis in medial right abdomen. There is mild dilatation and
mild wall thickening throughout the distal small bowel leading up to
the anastomosis (approximately distal 20 cm of small bowel) with
associated mild haziness of right mesenteric fat. Normal caliber
proximal small bowel loops. No wall thickening, diverticulosis or
pericolonic fat stranding in the remnant collapsed large bowel.

Vascular/Lymphatic: Normal caliber abdominal aorta. Patent portal,
splenic, hepatic and renal veins. Retroaortic left renal vein. No
pathologically enlarged lymph nodes in the abdomen or pelvis.

Reproductive: Grossly normal uterus.  No adnexal mass.

Other: No pneumoperitoneum, ascites or focal fluid collection.

Musculoskeletal: No aggressive appearing focal osseous lesions.
Moderate lumbar spondylosis.
IMPRESSION: Mild dilatation and mild wall thickening throughout the distal small
bowel leading up to the ileocolic anastomosis in the medial right
abdomen, with associated mild haziness of the right mesenteric fat.
Findings are most suggestive of a nonspecific enteritis with
associated mild ileus. Ileocolic anastomosis appears patent and
intact.

## 2021-05-23 ENCOUNTER — Other Ambulatory Visit: Payer: Self-pay | Admitting: Adult Health

## 2021-05-23 ENCOUNTER — Encounter: Payer: Self-pay | Admitting: Adult Health

## 2021-05-23 ENCOUNTER — Ambulatory Visit (INDEPENDENT_AMBULATORY_CARE_PROVIDER_SITE_OTHER): Payer: BC Managed Care – PPO | Admitting: Adult Health

## 2021-05-23 ENCOUNTER — Other Ambulatory Visit: Payer: Self-pay | Admitting: Gastroenterology

## 2021-05-23 DIAGNOSIS — Z780 Asymptomatic menopausal state: Secondary | ICD-10-CM

## 2021-05-23 DIAGNOSIS — F419 Anxiety disorder, unspecified: Secondary | ICD-10-CM | POA: Diagnosis not present

## 2021-05-23 DIAGNOSIS — F32A Depression, unspecified: Secondary | ICD-10-CM

## 2021-05-23 DIAGNOSIS — R232 Flushing: Secondary | ICD-10-CM | POA: Diagnosis not present

## 2021-05-23 DIAGNOSIS — Z79890 Hormone replacement therapy (postmenopausal): Secondary | ICD-10-CM | POA: Diagnosis not present

## 2021-05-23 DIAGNOSIS — R61 Generalized hyperhidrosis: Secondary | ICD-10-CM

## 2021-05-23 DIAGNOSIS — R52 Pain, unspecified: Secondary | ICD-10-CM | POA: Diagnosis not present

## 2021-05-23 MED ORDER — HYDROXYZINE HCL 10 MG PO TABS: 10.0000 mg | ORAL_TABLET | Freq: Three times a day (TID) | ORAL | 0 refills | Status: DC | PRN

## 2021-05-23 MED ORDER — NORETHINDRONE ACETATE 5 MG PO TABS
2.5000 mg | ORAL_TABLET | Freq: Every day | ORAL | 6 refills | Status: DC
Start: 2021-05-23 — End: 2022-04-07

## 2021-05-23 NOTE — Progress Notes (Signed)
Patient ID: Catherine Short, female   DOB: 01/22/1960, 61 y.o.   MRN: [id number]   TELEHEALTH GYNECOLOGY VISIT ENCOUNTER NOTE  Provider location: [facility] for [place] [facility]   Patient location: in RV not driving   I connected with YARIANNA VARBLE on 05/23/21 at 10:10 AM EDT by telephone and verified that I am speaking with the correct person using two identifiers. Patient was unable to do MyChart audiovisual encounter due to technical difficulties, she tried several times.    I discussed the limitations, risks, security and privacy concerns of performing an evaluation and management service by telephone and the availability of in person appointments. I also discussed with the patient that there may be a patient responsible charge related to this service. The patient expressed understanding and agreed to proceed.   History:  Catherine Short is a 61 y.o. [id number] female being evaluated today for increasing progesterone, and is not having hot flashes, or night sweats, and no more bleeding, body aches are better, still moody.  She using 1/2 patch and 2.5 mg aygestin. She denies any bleeding, pelvic pain or other concerns.       Past Medical History:  Diagnosis Date   Allergy    Anemia    d/t bleeding ulcers   Anxiety    Arthritis    ASCUS of cervix with negative high risk HPV 02/28/2021   02/28/21 repeat in 3 years per [facility], 5 year CIN3+ risk is 0.27%   Chronic constipation    Colitis    Depression    Esophagitis    Fibroid    Gastritis    GERD (gastroesophageal reflux disease)    H/O cold sores    H/O removal of cyst 10/2018   right arm    Headache    Heart murmur    "when I was 18"   Hormone replacement therapy (HRT) 11/14/2014   Hot flashes 11/14/2014   Increased endometrial stripe thickness 05/30/2015   Will get biopsy   Insomnia    Liver cyst    Menopause 11/14/2014   Moody 05/23/2015   Nerves 11/14/2014   Night sweats 11/14/2014   PMB (postmenopausal  bleeding) 05/23/2015   Prolactinoma ([place])    Rectocele 05/23/2015   Rotator cuff syndrome of left shoulder 10/30/2010   Stomach ulcer    Upper GI bleed 11/01/2018   due to ulcers   Past Surgical History:  Procedure Laterality Date   ABDOMINAL SURGERY     APPENDECTOMY     AUGMENTATION MAMMAPLASTY  2000   saline implants   BIOPSY  11/01/2018   Procedure: BIOPSY;  Surgeon: Danie Binder, MD;  [facility];  Service: Endoscopy;;   BREAST ENHANCEMENT SURGERY  1996   CARPAL TUNNEL RELEASE     COLONOSCOPY  12/2018   ESOPHAGOGASTRODUODENOSCOPY (EGD) WITH PROPOFOL N/A 11/01/2018   Procedure: ESOPHAGOGASTRODUODENOSCOPY (EGD) WITH PROPOFOL;  Surgeon: Danie Binder, MD;  [facility];  Service: Endoscopy;  Laterality: N/A;   LAPAROSCOPIC RIGHT HEMI COLECTOMY Right 02/27/2018   Procedure: LAPAROSCOPIC RIGHT HEMI COLECTOMY;  Surgeon: [name] Signs, MD;  Location: [facility];  Service: General;  Laterality: Right;   LAPAROTOMY N/A 02/27/2018   Procedure: EXPLORATORY LAPAROTOMY;  Surgeon: [name] Signs, MD;  Location: [facility];  Service: General;  Laterality: N/A;   TONSILECTOMY, ADENOIDECTOMY, BILATERAL MYRINGOTOMY AND TUBES     UPPER GASTROINTESTINAL ENDOSCOPY     WRIST SURGERY Left 09/04/2020   The following portions of the  patient's history were reviewed and updated as appropriate: allergies, current medications, past family history, past medical history, past social history, past surgical history and problem list.   Health Maintenance:  Lab Results  Component Value Date   DIAGPAP (A) 02/24/2021    - Atypical squamous cells of undetermined significance (ASC-US)   HPV NOT DETECTED 08/19/2016   [place] Negative 02/24/2021    Normal mammogram on 04/17/21  Review of Systems:  Pertinent items noted in HPI and remainder of comprehensive ROS otherwise negative.  Physical Exam:   General:  Alert, oriented and cooperative.   Mental Status: Normal mood and affect perceived.  Normal judgment and thought content.  Physical exam deferred due to nature of the encounter    05/23/2021   10:05 AM 02/24/2021    9:58 AM 01/02/2020    3:51 PM  Depression screen PHQ 2/9  Decreased Interest 0 3 2  Down, Depressed, Hopeless '1 2 2  '$ PHQ - 2 Score '1 5 4  '$ Altered sleeping 0 3 1  Tired, decreased energy '1 3 2  '$ Change in appetite 0 3 2  Feeling bad or failure about yourself  '2 2 2  '$ Trouble concentrating 0 1 1  Moving slowly or fidgety/restless '3 2 2  '$ Suicidal thoughts 0 0 0  PHQ-9 Score '7 19 14  '$ Difficult doing work/chores   Not difficult at all       05/23/2021   10:07 AM 02/24/2021    9:58 AM 01/02/2020    3:51 PM 06/07/2019    4:06 PM  GAD 7 : Generalized Anxiety Score  Nervous, Anxious, on Edge '3 3 3 1  '$ Control/stop worrying '3 3 3 1  '$ Worry too much - different things '3 3 3 1  '$ Trouble relaxing '3 2 3 1  '$ Restless '3 2 3 1  '$ Easily annoyed or irritable '3 3 3 1  '$ Afraid - awful might happen 0 '1 1 1  '$ Total GAD 7 Score '18 17 19 7  '$ Anxiety Difficulty   Not difficult at all Not difficult at all       Upstream - 05/23/21 1106       Pregnancy Intention Screening   Does the patient want to become pregnant in the next year? No    Does the patient's partner want to become pregnant in the next year? No    Would the patient like to discuss contraceptive options today? No      Contraception Wrap Up   Current Method Vasectomy    End Method Vasectomy    Contraception Counseling Provided No             Labs and Imaging No results found for this or any previous visit (from the past 336 hour(s)). No results found.    Assessment and Plan:      1. Hormone replacement therapy (HRT) Continue climara pro 1/2 patch weekly(has refills) and aygestin 2.5 mg daily Meds ordered this encounter  Medications   hydrOXYzine (ATARAX) 10 MG tablet    Sig: Take 1 tablet (10 mg total) by mouth 3 (three) times daily as needed.    Dispense:  30 tablet    Refill:  0    Order Specific  Question:   Supervising Provider    Answer:   Tania Ade H [[id number]]   norethindrone (AYGESTIN) 5 MG tablet    Sig: Take 0.5 tablets (2.5 mg total) by mouth daily.    Dispense:  30 tablet    Refill:  6  Order Specific Question:   Supervising Provider    Answer:   Tania Ade H [[id number]]     2. Anxiety and depression Continue Wellbutrin 150 mg XL daily,has refills  will add vistaril 10 mg  prn Let me know if vistaril helps   3. Generalized body aches, better   4. Hot flashes, resolved  5. Night sweats,resolved  6. Postmenopause          Follow prn  I discussed the assessment and treatment plan with the patient. The patient was provided an opportunity to ask questions and all were answered. The patient agreed with the plan and demonstrated an understanding of the instructions.   The patient was advised to call back or seek an in-person evaluation/go to the ED if the symptoms worsen or if the condition fails to improve as anticipated.  I provided 10  minutes of non-face-to-face time during this encounter. I was in my office at [facility] tree during this encounter   Derrek Monaco, NP Center for [employer], [place]

## 2021-05-26 ENCOUNTER — Other Ambulatory Visit: Payer: Self-pay | Admitting: Adult Health

## 2021-05-26 MED ORDER — VALACYCLOVIR HCL 500 MG PO TABS: 500.0000 mg | ORAL_TABLET | Freq: Every day | ORAL | 3 refills | Status: DC

## 2021-05-26 NOTE — Progress Notes (Signed)
Refilled [email or web address]

## 2021-05-29 ENCOUNTER — Telehealth: Payer: BC Managed Care – PPO | Admitting: Adult Health

## 2021-05-30 ENCOUNTER — Ambulatory Visit: Payer: BC Managed Care – PPO | Admitting: Adult Health

## 2021-06-10 ENCOUNTER — Encounter: Payer: Self-pay | Admitting: Gastroenterology

## 2021-06-10 ENCOUNTER — Ambulatory Visit (INDEPENDENT_AMBULATORY_CARE_PROVIDER_SITE_OTHER): Payer: BC Managed Care – PPO | Admitting: Gastroenterology

## 2021-06-10 VITALS — BP 110/80 | HR 72 | Ht 62.0 in | Wt 136.0 lb

## 2021-06-10 DIAGNOSIS — R197 Diarrhea, unspecified: Secondary | ICD-10-CM

## 2021-06-10 MED ORDER — MESALAMINE 1.2 G PO TBEC: 4.8000 g | DELAYED_RELEASE_TABLET | Freq: Every day | ORAL | 3 refills | Status: DC

## 2021-06-10 NOTE — Progress Notes (Signed)
Referring Provider: Loman Brooklyn, FNP Primary Care Physician:  Loman Brooklyn, FNP  Chief complaint:  constipation  IMPRESSION:  Chronic diarrhea    - recent studies negative including fecal calprotectin, pancreatic elastase, giardia    - normal duodenal biopsies on EGD 2021    - remote history of microscopic colitis History of acute CT-diagnosed enteritis with associated ileus presenting with severe pain 02/21/20 Chronic constipation    - No response to Miralax daily, Citrucel, magnesium oxide, swiss chris, Amitiza, Linzess PRN Chronic nausea - may be related to constipation History of NSAID related gastric ulcers, resolved on follow-up EGD History of iron deficiency anemia     - labs 4/21 were normal Internal hemorrhoids History of cecal volvulus status post right hemicolectomy 02/2018 Partial small bowel obstruction due to adhesions, hospitalized at [facility] 08/2018 Remote diagnosis of microscopic colitis in 2011 No polyps on colonoscopy 12/19/2018    - colonoscopy recommended again in 10 years   Chronic diarrhea with history of microscopic colitis. Eight months of symptoms despite empiric trial of budesonide. Fecal calprotectin, ESR, and CRP were normal. Giardia testing was negative. Duodenal biopsies were normal on EGD 2021. Will try Lialda as she remembers it dramatically helping her diarrhea in 2011.   Chronic constipation, previously not responding to Miralax daily, Citrucel, magnesium oxide, swiss chris, Amitiaz, or Linzess to 290 mcg daily. Not an issue since her diarrhea started.   Nausea: Resolved on omeprazole. Temporally associated with progressive constipation. May reflect diffuse GI dysmotility disorder.   H pylori negative gastritis, reflux, and PUD: Clinical improvement noted on last EGD. Symptoms controlled on omeprazole 40 mg QD. Avoid all NSAIDs.   History of iron deficiency anemia: Likely due to history of NSAID-related PUD and gastritis. Labs show  resolution.    PLAN: - Taper omeprazole 40 mg to QOD dosing and then discontinue is symptoms remain controlled - Continue Bentyl 20 mg QID PRN with instructions to take prior to meals - Hold Entecort 64m daily as she doesn't feel this is helping - Start Lialda 4.8 g daily - Discussed dietary strategies including avoiding sugar substitutes and dairy - Colonoscopy with random biopsies if no response to 2 weeks of Lialda. She is concerned about the cost of a colonoscopy.   Please see the "Patient Instructions" section for addition details about the plan.  HPI: Catherine VIVEROSis a 61y.o. female who is seen today for evaluation of diarrhea and abdominal pain.  She was last seen by me on 02/23/2020 for enteritis and more recently by JAlonza Bogus2/27/23.  The interval history is obtained obtained through the patient and review of electronic health records. She has a history of microscopic colitis diagnosed in 2011 (Dr. KDeatra Ina but had not been on any treatment since that time, [facility], NSAID induced gastric ulcers on EGD 10/20 (Dr. FOneida Alar with follow-up EGD 03/21/19 showing documented healing, and has more recently had chronic constipation.   Colonoscopy 12/19/2018 showed end-to-end ileocolonic anastomosis and was otherwise normal.  Repeat colonoscopy recommended in 10 years.  Last seen by me in 2022 for acute onset abdominal pain. CT performed in the ED showed enteritis with an associated mild ileus.  Seen 03/03/21 by JAlonza Bogusfor 3-4 months of diarrhea. She was having 6-7 poorly formed bowel movements each morning with associated crampy left midabdominal pain and urgency. Frequently triggered by eating.    Labs prior to that  visit through her gynecologist showed a normal sed rate, TSH, CBC, CMP, and fecal occult blood test.  Stool studies at that time included a negative C. difficile by PCR, normal fecal  calprotectin, ova and parasite testing, and normal pancreatic elastase.  She was started on budesonide 79m daily and continued on Bentyl. She has been using Imodium daily. Symptoms may be similar to those that she experienced in 2011 when she had microscopic colitis.   Returns today in follow-up. No nocturnal symptoms. Denies a precipitating event, trauma, close contacts with similar symptoms, changes in diet, recent travel or antibiotic use. No fevers, chills, night sweats.  She has actually been eating a high protein and has minimized fruits and vegetables that trigger diarrhea. She stopped all supplements and vitamins for a week without change. She is not using any NSAIDs. It is causing stress at work but she does not think that stress is contributing. Given her persistent symptoms, she is concerned about cancer.    Past Medical History:  Diagnosis Date   Allergy    Anemia    d/t bleeding ulcers   Anxiety    Arthritis    ASCUS of cervix with negative high risk HPV 02/28/2021   02/28/21 repeat in 3 years per [facility], 5 year CIN3+ risk is 0.27%   Chronic constipation    Colitis    Depression    Esophagitis    Fibroid    Gastritis    GERD (gastroesophageal reflux disease)    H/O cold sores    H/O removal of cyst 10/2018   right arm    Headache    Heart murmur    "when I was 18"   Hormone replacement therapy (HRT) 11/14/2014   Hot flashes 11/14/2014   Increased endometrial stripe thickness 05/30/2015   Will get biopsy   Insomnia    Liver cyst    Menopause 11/14/2014   Moody 05/23/2015   Nerves 11/14/2014   Night sweats 11/14/2014   PMB (postmenopausal bleeding) 05/23/2015   [place]    Rectocele 05/23/2015   Rotator cuff syndrome of left shoulder 10/30/2010   Stomach ulcer    Upper GI bleed 11/01/2018   due to ulcers    Past Surgical History:  Procedure Laterality Date   ABDOMINAL SURGERY     APPENDECTOMY     AUGMENTATION MAMMAPLASTY  2000   saline implants   BIOPSY   11/01/2018   Procedure: BIOPSY;  Surgeon: FDanie Binder MD;  [facility];  Service: Endoscopy;;   BREAST ENHANCEMENT SURGERY  1996   CARPAL TUNNEL RELEASE     COLONOSCOPY  12/2018   ESOPHAGOGASTRODUODENOSCOPY (EGD) WITH PROPOFOL N/A 11/01/2018   Procedure: ESOPHAGOGASTRODUODENOSCOPY (EGD) WITH PROPOFOL;  Surgeon: FDanie Binder MD;  [facility];  Service: Endoscopy;  Laterality: N/A;   LAPAROSCOPIC RIGHT HEMI COLECTOMY Right 02/27/2018   Procedure: LAPAROSCOPIC RIGHT HEMI COLECTOMY;  Surgeon: JAviva Signs MD;  Location: [facility];  Service: General;  Laterality: Right;   LAPAROTOMY N/A 02/27/2018   Procedure: EXPLORATORY LAPAROTOMY;  Surgeon: JAviva Signs MD;  Location: [facility];  Service: General;  Laterality: N/A;   TONSILECTOMY, ADENOIDECTOMY, BILATERAL MYRINGOTOMY AND TUBES     UPPER GASTROINTESTINAL ENDOSCOPY     WRIST SURGERY Left 09/04/2020       Physical Exam: General:   Alert,  well-nourished, pleasant and cooperative in NAD Head:  Normocephalic and atraumatic. Eyes:  Sclera clear, no icterus.   Conjunctiva pink. Abdomen:  Soft, nontender,  nondistended, normal bowel sounds, no rebound or guarding. No hepatosplenomegaly. No tympany.  Well-healed surgical scar with an associated ventral hernia.  Neurologic:  Alert and  oriented x4;  grossly nonfocal Skin:  Intact without significant lesions or rashes. Psych:  Alert and cooperative. Normal mood and affect.    Tashaun Obey L. Tarri Glenn, MD, MPH 06/10/2021, 9:13 AM

## 2021-06-10 NOTE — Patient Instructions (Addendum)
It was my pleasure to provide care to you today. Based on our discussion, I am providing you with my recommendations below:  RECOMMENDATION(S):   Take omeprazole every other day for one week. Stop it all together if you don't feel any different. If symptoms return restart the omeprazole.  Continue to take Bentyl. I would try taking it before meals to minimize some urgency.  Stop taking your Entecort. Resume it if your diarrhea gets worse.  Start Lialda 4.8 grams daily.   FOLLOW UP:  Send me a MyChart message in 2 weeks. If your diarrhea is not better, we should plan a colonoscopy with biopsies to reevaluate for microscopic colitis.   BMI:  If you are age 52 or older, your body mass index should be between 23-30. Your Body mass index is 24.87 kg/m. If this is out of the aforementioned range listed, please consider follow up with your Primary Care Provider.  If you are age 68 or younger, your body mass index should be between 19-25. Your Body mass index is 24.87 kg/m. If this is out of the aformentioned range listed, please consider follow up with your Primary Care Provider.   MY CHART:  The [facility] providers would like to encourage you to use [facility] to communicate with providers for non-urgent requests or questions.  Due to long hold times on the telephone, sending your provider a message by [facility] may be a faster and more efficient way to get a response.  Please allow 48 business hours for a response.  Please remember that this is for non-urgent requests.   Thank you for trusting me with your gastrointestinal care!    Thornton Park, MD, MPH

## 2021-06-11 ENCOUNTER — Encounter: Payer: Self-pay | Admitting: Gastroenterology

## 2021-07-08 ENCOUNTER — Other Ambulatory Visit: Payer: Self-pay | Admitting: Adult Health

## 2021-07-08 ENCOUNTER — Other Ambulatory Visit: Payer: Self-pay | Admitting: Gastroenterology

## 2021-08-05 ENCOUNTER — Telehealth: Payer: Self-pay

## 2021-08-05 NOTE — Chronic Care Management (AMB) (Signed)
  Care Coordination  Outreach Note  08/05/2021 Name: AAMANI MOOSE MRN: [id number] DOB: 1960/01/23   Care Coordination Outreach Attempts  An unsuccessful telephone outreach was attempted today to offer the patient information about available care coordination services as a benefit of their health plan.   Follow Up Plan:  Additional outreach attempts will be made to offer the patient care coordination information and services.   Encounter Outcome:  No Answer  Noreene Larsson, [place],  [address] Direct Dial: [phone] [email or web address]

## 2021-08-05 NOTE — Chronic Care Management (AMB) (Signed)
  Care Coordination  Note  08/05/2021 Name: ENORA TRILLO MRN: [id number] DOB: 06-09-1960  Lauris Chroman is a 61 y.o. year old female who is a primary care patient of Loman Brooklyn, FNP. I reached out to Lauris Chroman by phone today to offer care coordination services.      Ms. Schoenfelder was given information about Care Coordination services today including:  The Care Coordination services include support from the care team which includes your Nurse Coordinator, Clinical Social Worker, or Pharmacist.  The Care Coordination team is here to help remove barriers to the health concerns and goals most important to you. Care Coordination services are voluntary and the patient may decline or stop services at any time by request to their care team member.   Patient did not agree to participate in care coordination services at this time.  Follow up plan: Patient declines further follow up or participation in care coordination services.   Noreene Larsson, [place], [place] [address] Direct Dial: [phone] [email or web address]

## 2021-08-12 ENCOUNTER — Other Ambulatory Visit: Payer: Self-pay | Admitting: Adult Health

## 2021-08-12 MED ORDER — BUPROPION HCL ER (XL) 150 MG PO TB24
150.0000 mg | ORAL_TABLET | Freq: Every morning | ORAL | 1 refills | Status: DC
Start: 2021-08-12 — End: 2021-11-03

## 2021-08-12 NOTE — Progress Notes (Signed)
Refilled wellbutrin.  

## 2021-10-30 ENCOUNTER — Telehealth (INDEPENDENT_AMBULATORY_CARE_PROVIDER_SITE_OTHER): Payer: BC Managed Care – PPO | Admitting: Family Medicine

## 2021-10-30 DIAGNOSIS — J0100 Acute maxillary sinusitis, unspecified: Secondary | ICD-10-CM

## 2021-10-30 DIAGNOSIS — R002 Palpitations: Secondary | ICD-10-CM

## 2021-10-30 MED ORDER — LEVOFLOXACIN 500 MG PO TABS: 500.0000 mg | ORAL_TABLET | Freq: Every day | ORAL | 0 refills | Status: DC

## 2021-10-30 NOTE — Progress Notes (Signed)
    Subjective:    Patient ID: Catherine Short, female    DOB: 1960/07/31, 61 y.o.   MRN: [id number]   HPI: Catherine Short is a 61 y.o. female presenting for ears full of fluid for a week. Two days ago developed drainage and green phlegm, sore throat. Some nausea. Not much cough. HA over eyebrows, around eyes.  Took levofloxacin in the past without side effects and it worked well. REquests that medication . Has had some palpitations with this illness so avoid or use decongestants carefully  Relevant past medical, surgical, family and social history reviewed and updated as indicated.  Interim medical history since our last visit reviewed. Allergies and medications reviewed and updated.  ROS:  Review of Systems  Constitutional: Negative.   HENT:  Positive for congestion, ear pain, postnasal drip and rhinorrhea. Negative for hearing loss and nosebleeds.   Eyes:  Negative for visual disturbance.  Respiratory:  Positive for cough. Negative for shortness of breath.   Cardiovascular:  Positive for palpitations. Negative for chest pain.  Gastrointestinal:  Negative for abdominal pain.  Musculoskeletal:  Negative for arthralgias.  Neurological:  Positive for headaches (frontal).     Social History   Tobacco Use  Smoking Status Former   Years: 6.00   Types: Cigarettes   Quit date: 03/06/1993   Years since quitting: 28.6  Smokeless Tobacco Never       Objective:     Wt Readings from Last 3 Encounters:  06/10/21 136 lb (61.7 kg)  03/26/21 142 lb (64.4 kg)  03/03/21 136 lb 3.2 oz (61.8 kg)     Exam deferred. Pt. Harboring due to COVID 19. Phone visit performed.   Assessment & Plan:   1. Acute maxillary sinusitis, recurrence not specified   2. Palpitation     Meds ordered this encounter  Medications   levofloxacin (LEVAQUIN) 500 MG tablet    Sig: Take 1 tablet (500 mg total) by mouth daily. For 10 days    Dispense:  10 tablet    Refill:  0    No orders of the defined  types were placed in this encounter.     Diagnoses and all orders for this visit:  Acute maxillary sinusitis, recurrence not specified  Palpitation  Other orders -     levofloxacin (LEVAQUIN) 500 MG tablet; Take 1 tablet (500 mg total) by mouth daily. For 10 days    Virtual Visit via telephone Note  I discussed the limitations, risks, security and privacy concerns of performing an evaluation and management service by telephone and the availability of in person appointments. The patient was identified with two identifiers. Pt.expressed understanding and agreed to proceed. Pt. Is at home. Dr. Livia Snellen is in his office.  Follow Up Instructions:   I discussed the assessment and treatment plan with the patient. The patient was provided an opportunity to ask questions and all were answered. The patient agreed with the plan and demonstrated an understanding of the instructions.   The patient was advised to call back or seek an in-person evaluation if the symptoms worsen or if the condition fails to improve as anticipated.   Total minutes including chart review and phone contact time: 12   Follow up plan: No follow-ups on file.  Claretta Fraise, MD [place]

## 2021-11-01 ENCOUNTER — Other Ambulatory Visit: Payer: Self-pay | Admitting: Adult Health

## 2021-12-22 ENCOUNTER — Encounter: Payer: Self-pay | Admitting: Family Medicine

## 2021-12-22 ENCOUNTER — Other Ambulatory Visit: Payer: Self-pay | Admitting: Adult Health

## 2021-12-22 MED ORDER — BUSPIRONE HCL 5 MG PO TABS: 5.0000 mg | ORAL_TABLET | Freq: Three times a day (TID) | ORAL | 2 refills | Status: DC

## 2021-12-22 MED ORDER — BUPROPION HCL ER (XL) 300 MG PO TB24: 300.0000 mg | ORAL_TABLET | ORAL | 2 refills | Status: DC

## 2021-12-22 NOTE — Progress Notes (Signed)
Will increase Wellbutrin to 300 mg daily and add buspar 5 mg 1 tid

## 2022-01-06 ENCOUNTER — Other Ambulatory Visit: Payer: Self-pay | Admitting: Adult Health

## 2022-01-06 MED ORDER — BUPROPION HCL ER (XL) 300 MG PO TB24: 300.0000 mg | ORAL_TABLET | ORAL | 1 refills | Status: DC

## 2022-01-06 NOTE — Progress Notes (Signed)
Refilled Well[facility]rin to [facility]

## 2022-01-08 ENCOUNTER — Other Ambulatory Visit: Payer: Self-pay | Admitting: Adult Health

## 2022-01-08 MED ORDER — BUSPIRONE HCL 5 MG PO TABS: 5.0000 mg | ORAL_TABLET | Freq: Three times a day (TID) | ORAL | 2 refills | Status: DC

## 2022-01-08 NOTE — Progress Notes (Signed)
Rx for Buspar sent to [facility]

## 2022-03-03 ENCOUNTER — Ambulatory Visit: Payer: BC Managed Care – PPO | Admitting: Adult Health

## 2022-03-09 ENCOUNTER — Other Ambulatory Visit: Payer: Self-pay | Admitting: Adult Health

## 2022-03-09 MED ORDER — NITROFURANTOIN MONOHYD MACRO 100 MG PO CAPS: 100.0000 mg | ORAL_CAPSULE | Freq: Two times a day (BID) | ORAL | 0 refills | Status: DC

## 2022-03-09 NOTE — Progress Notes (Signed)
Rx macrobid

## 2022-04-07 ENCOUNTER — Encounter: Payer: Self-pay | Admitting: Adult Health

## 2022-04-07 ENCOUNTER — Other Ambulatory Visit (HOSPITAL_COMMUNITY)
Admission: RE | Admit: 2022-04-07 | Discharge: 2022-04-07 | Disposition: A | Discharge status: Home / Self Care | Payer: BC Managed Care – PPO | Source: Ambulatory Visit | Attending: Adult Health | Admitting: Adult Health

## 2022-04-07 ENCOUNTER — Ambulatory Visit (INDEPENDENT_AMBULATORY_CARE_PROVIDER_SITE_OTHER): Payer: BC Managed Care – PPO | Admitting: Adult Health

## 2022-04-07 VITALS — BP 117/76 | HR 61 | Ht 63.0 in | Wt 133.0 lb

## 2022-04-07 DIAGNOSIS — F419 Anxiety disorder, unspecified: Secondary | ICD-10-CM

## 2022-04-07 DIAGNOSIS — R232 Flushing: Secondary | ICD-10-CM | POA: Diagnosis not present

## 2022-04-07 DIAGNOSIS — Z13220 Encounter for screening for lipoid disorders: Secondary | ICD-10-CM | POA: Diagnosis not present

## 2022-04-07 DIAGNOSIS — Z01419 Encounter for gynecological examination (general) (routine) without abnormal findings: Secondary | ICD-10-CM | POA: Diagnosis not present

## 2022-04-07 DIAGNOSIS — Z1329 Encounter for screening for other suspected endocrine disorder: Secondary | ICD-10-CM | POA: Diagnosis not present

## 2022-04-07 DIAGNOSIS — R61 Generalized hyperhidrosis: Secondary | ICD-10-CM | POA: Diagnosis not present

## 2022-04-07 DIAGNOSIS — F32A Depression, unspecified: Secondary | ICD-10-CM

## 2022-04-07 DIAGNOSIS — Z131 Encounter for screening for diabetes mellitus: Secondary | ICD-10-CM

## 2022-04-07 DIAGNOSIS — Z780 Asymptomatic menopausal state: Secondary | ICD-10-CM

## 2022-04-07 DIAGNOSIS — D352 Benign neoplasm of pituitary gland: Secondary | ICD-10-CM | POA: Diagnosis not present

## 2022-04-07 MED ORDER — VEOZAH 45 MG PO TABS: 1.0000 | ORAL_TABLET | Freq: Every day | ORAL | 0 refills | Status: DC

## 2022-04-07 MED ORDER — VALACYCLOVIR HCL 500 MG PO TABS: 500.0000 mg | ORAL_TABLET | Freq: Every day | ORAL | 3 refills | Status: AC

## 2022-04-07 NOTE — Progress Notes (Signed)
Patient ID: Catherine Short, female   DOB: January 13, 1960, 62 y.o.   MRN: [id number] History of Present Illness: Catherine Short is a 62 year old white female,married, PM in for a well woman gyn exam and she wants a pap. She stopped HRT had hair loss, and is having 4-5 hot flashes during the day but having about 5 night sweats and not sleeping well.  She says adding Buspar to Wellbutrin is a Higher education careers adviser. She requests fasting labs today.  Last pap was ASCUS negative HPV 02/24/21.  PCP is Dr Livia Snellen at [facility]    Current Medications, Allergies, Past Medical History, Past Surgical History, Family History and Social History were reviewed in [employer] record.     Review of Systems: Patient denies any headaches, hearing loss, fatigue, blurred vision, shortness of breath, chest pain, abdominal pain, problems with bowel movements(she has normal BM in am then watery, sees GI) urination, or intercourse. No joint pain or mood swings. Denies any vaginal bleeding See HPI for positives   Physical Exam:BP 117/76 (BP Location: Left Arm, Patient Position: Sitting, Cuff Size: Normal)   Pulse 61   Ht 5\' 3"  (1.6 m)   Wt 133 lb (60.3 kg)   LMP 07/05/2016 (Approximate) Comment: neg upt  BMI 23.56 kg/m   General:  Well developed, well nourished, no acute distress Skin:  Warm and dry Neck:  Midline trachea, normal thyroid, good ROM, no lymphadenopathy Lungs; Clear to auscultation bilaterally Breast:  No dominant palpable mass, retraction, or nipple discharge,has bilateral implants Cardiovascular: Regular rate and rhythm Abdomen:  Soft, non tender, no hepatosplenomegaly Pelvic:  External genitalia is normal in appearance, no lesions.  The vagina is pale. Urethra has no lesions or masses. The cervix is smooth, pap with HR HPV genotyping performed. Uterus is felt to be normal size, shape, and contour.  No adnexal masses or tenderness noted.Bladder is non tender, no masses felt. Rectal:  Deferred at pt request Extremities/musculoskeletal:  No swelling or varicosities noted, no clubbing or cyanosis Psych:  No mood changes, alert and cooperative,seems happy AA is 2 Fallr isi is low    04/07/2022    8:57 AM 05/23/2021   10:05 AM 02/24/2021    9:58 AM  Depression screen PHQ 2/9  Decreased Interest 1 0 3  Down, Depressed, Hopeless 1 1 2   PHQ - 2 Score 2 1 5   Altered sleeping 3 0 3  Tired, decreased energy 1 1 3   Change in appetite 0 0 3  Feeling bad or failure about yourself  0 2 2  Trouble concentrating 0 0 1  Moving slowly or fidgety/restless 0 3 2  Suicidal thoughts 0 0 0  PHQ-9 Score 6 7 19        04/07/2022    8:57 AM 05/23/2021   10:07 AM 02/24/2021    9:58 AM 01/02/2020    3:51 PM  GAD 7 : Generalized Anxiety Score  Nervous, Anxious, on Edge 2 3 3 3   Control/stop worrying 2 3 3 3   Worry too much - different things 2 3 3 3   Trouble relaxing 2 3 2 3   Restless 2 3 2 3   Easily annoyed or irritable 1 3 3 3   Afraid - awful might happen 2 0 1 1  Total GAD 7 Score 13 18 17 19   Anxiety Difficulty    Not difficult at all      Upstream - 04/07/22 0905       Pregnancy Intention Screening  Does the patient want to become pregnant in the next year? N/A    Does the patient's partner want to become pregnant in the next year? N/A    Would the patient like to discuss contraceptive options today? No      Contraception Wrap Up   Current Method Vasectomy    End Method Vasectomy    Contraception Counseling Provided No            Examination chaperoned by Levy Pupa LPN   Impression and plan: 1. Encounter for gynecological examination with Papanicolaou smear of cervix Pap sent Pap in 3 years if normal Physical in 1 year Will check labs  - Cytology - PAP( ) - CBC - Comprehensive metabolic panel - TSH + free T4 - Lipid panel Refilled valtrex at her request  2. Anxiety and depression Continue Buspar 5 mg 1 tid Continue Wellbutrin XL 300 mg 1  daily She has refills   3. Hot flashes Having 4-5 a day but about 5 at night Will try veozah after labs back Gave 21 tablets to try and handout Will rx if helps   Meds ordered this encounter  Medications   valACYclovir (VALTREX) 500 MG tablet    Sig: Take 1 tablet (500 mg total) by mouth daily.    Dispense:  90 tablet    Refill:  3    Requesting 1 year supply    Order Specific Question:   Supervising Provider    Answer:   Elonda Husky, LUTHER H [[id number]]   Fezolinetant (VEOZAH) 45 MG TABS    Sig: Take 1 tablet (45 mg total) by mouth daily.    Dispense:  21 tablet    Refill:  0    Order Specific Question:   Supervising Provider    Answer:   Elonda Husky, LUTHER H [[id number]]     4. Night sweats Has about 5 a night and it is affecting sleep  5. Postmenopause Denies any bleeding   6. Prolactinoma Hx of prolactinoma, requests prolactin level  - Prolactin  7. Screening cholesterol level - Lipid panel  8. Screening for thyroid disorder - TSH + free T4  9. Screening for diabetes mellitus - Hemoglobin A1c

## 2022-04-08 LAB — CBC
Hematocrit: 43.2 % (ref 34.0–46.6)
Hemoglobin: 14.2 g/dL (ref 11.1–15.9)
MCH: 32.2 pg (ref 26.6–33.0)
MCHC: 32.9 g/dL (ref 31.5–35.7)
MCV: 98 fL — ABNORMAL HIGH (ref 79–97)
Platelets: 220 10*3/uL (ref 150–450)
RBC: 4.41 x10E6/uL (ref 3.77–5.28)
RDW: 12.8 % (ref 11.7–15.4)
WBC: 5.3 10*3/uL (ref 3.4–10.8)

## 2022-04-08 LAB — COMPREHENSIVE METABOLIC PANEL
ALT: 23 IU/L (ref 0–32)
AST: 28 IU/L (ref 0–40)
Albumin/Globulin Ratio: 2.6 — ABNORMAL HIGH (ref 1.2–2.2)
Albumin: 4.6 g/dL (ref 3.8–4.9)
Alkaline Phosphatase: 48 IU/L (ref 44–121)
BUN/Creatinine Ratio: 20 (ref 9–23)
BUN: 17 mg/dL (ref 6–24)
Bilirubin Total: 0.7 mg/dL (ref 0.0–1.2)
CO2: 22 mmol/L (ref 20–29)
Calcium: 9.5 mg/dL (ref 8.7–10.2)
Chloride: 103 mmol/L (ref 96–106)
Creatinine, Ser: 0.85 mg/dL (ref 0.57–1.00)
Globulin, Total: 1.8 g/dL (ref 1.5–4.5)
Glucose: 99 mg/dL (ref 70–99)
Potassium: 5 mmol/L (ref 3.5–5.2)
Sodium: 142 mmol/L (ref 134–144)
Total Protein: 6.4 g/dL (ref 6.0–8.5)
eGFR: 79 mL/min/{1.73_m2} (ref >59)

## 2022-04-08 LAB — TSH+FREE T4
Free T4: 1.33 ng/dL (ref 0.82–1.77)
TSH: 1.34 u[IU]/mL (ref 0.450–4.500)

## 2022-04-08 LAB — HEMOGLOBIN A1C
Est. average glucose Bld gHb Est-mCnc: 105 mg/dL
Hgb A1c MFr Bld: 5.3 % (ref 4.8–5.6)

## 2022-04-08 LAB — LIPID PANEL
Chol/HDL Ratio: 2.1 ratio (ref 0.0–4.4)
Cholesterol, Total: 183 mg/dL (ref 100–199)
HDL: 88 mg/dL (ref >39)
LDL Chol Calc (NIH): 83 mg/dL (ref 0–99)
Triglycerides: 62 mg/dL (ref 0–149)
VLDL Cholesterol Cal: 12 mg/dL (ref 5–40)

## 2022-04-08 LAB — PROLACTIN: Prolactin: 13.2 ng/mL (ref 3.6–25.2)

## 2022-04-10 LAB — CYTOLOGY - PAP
Comment: NEGATIVE
Diagnosis: NEGATIVE
High risk HPV: NEGATIVE

## 2022-04-14 ENCOUNTER — Other Ambulatory Visit: Payer: Self-pay | Admitting: Adult Health

## 2022-04-14 MED ORDER — VEOZAH 45 MG PO TABS: 1.0000 | ORAL_TABLET | Freq: Every day | ORAL | 3 refills | Status: DC

## 2022-04-14 NOTE — Progress Notes (Signed)
Will send veozah to [facility]

## 2022-05-01 DIAGNOSIS — Z1231 Encounter for screening mammogram for malignant neoplasm of breast: Secondary | ICD-10-CM | POA: Diagnosis not present

## 2022-05-04 ENCOUNTER — Telehealth: Payer: Self-pay | Admitting: *Deleted

## 2022-05-04 NOTE — Telephone Encounter (Signed)
Pt's insurance has approved Veozah. Pt aware and voiced understanding. JSY

## 2022-05-11 ENCOUNTER — Other Ambulatory Visit: Payer: Self-pay | Admitting: Adult Health

## 2022-05-11 MED ORDER — NITROFURANTOIN MONOHYD MACRO 100 MG PO CAPS: 100.0000 mg | ORAL_CAPSULE | Freq: Two times a day (BID) | ORAL | 2 refills | Status: DC

## 2022-05-11 NOTE — Progress Notes (Signed)
Rx macrobid  

## 2022-06-03 ENCOUNTER — Other Ambulatory Visit: Payer: Self-pay | Admitting: Adult Health

## 2022-07-13 ENCOUNTER — Other Ambulatory Visit: Payer: Self-pay | Admitting: Adult Health

## 2022-11-16 ENCOUNTER — Other Ambulatory Visit: Payer: Self-pay | Admitting: Adult Health

## 2022-11-26 ENCOUNTER — Other Ambulatory Visit: Payer: Self-pay

## 2022-11-26 ENCOUNTER — Emergency Department (HOSPITAL_COMMUNITY)
Admission: EM | Admit: 2022-11-26 | Discharge: 2022-11-26 | Disposition: A | Discharge status: Home / Self Care | Payer: BC Managed Care – PPO | Attending: Emergency Medicine | Admitting: Emergency Medicine

## 2022-11-26 ENCOUNTER — Emergency Department (HOSPITAL_COMMUNITY): Payer: BC Managed Care – PPO

## 2022-11-26 ENCOUNTER — Encounter (HOSPITAL_COMMUNITY): Payer: Self-pay

## 2022-11-26 DIAGNOSIS — K7689 Other specified diseases of liver: Secondary | ICD-10-CM | POA: Diagnosis not present

## 2022-11-26 DIAGNOSIS — R1033 Periumbilical pain: Secondary | ICD-10-CM | POA: Insufficient documentation

## 2022-11-26 DIAGNOSIS — R1030 Lower abdominal pain, unspecified: Secondary | ICD-10-CM | POA: Diagnosis not present

## 2022-11-26 DIAGNOSIS — R197 Diarrhea, unspecified: Secondary | ICD-10-CM | POA: Diagnosis not present

## 2022-11-26 DIAGNOSIS — K429 Umbilical hernia without obstruction or gangrene: Secondary | ICD-10-CM | POA: Diagnosis not present

## 2022-11-26 DIAGNOSIS — K529 Noninfective gastroenteritis and colitis, unspecified: Secondary | ICD-10-CM | POA: Diagnosis not present

## 2022-11-26 LAB — CBC WITH DIFFERENTIAL/PLATELET
Abs Immature Granulocytes: 0.02 10*3/uL (ref 0.00–0.07)
Basophils Absolute: 0 10*3/uL (ref 0.0–0.1)
Basophils Relative: 0 %
Eosinophils Absolute: 0.1 10*3/uL (ref 0.0–0.5)
Eosinophils Relative: 2 %
HCT: 39.8 % (ref 36.0–46.0)
Hemoglobin: 13.8 g/dL (ref 12.0–15.0)
Immature Granulocytes: 0 %
Lymphocytes Relative: 32 %
Lymphs Abs: 1.7 10*3/uL (ref 0.7–4.0)
MCH: 33 pg (ref 26.0–34.0)
MCHC: 34.7 g/dL (ref 30.0–36.0)
MCV: 95.2 fL (ref 80.0–100.0)
Monocytes Absolute: 0.5 10*3/uL (ref 0.1–1.0)
Monocytes Relative: 8 %
Neutro Abs: 3 10*3/uL (ref 1.7–7.7)
Neutrophils Relative %: 58 %
Platelets: 210 10*3/uL (ref 150–400)
RBC: 4.18 MIL/uL (ref 3.87–5.11)
RDW: 12.2 % (ref 11.5–15.5)
WBC: 5.4 10*3/uL (ref 4.0–10.5)
nRBC: 0 % (ref 0.0–0.2)

## 2022-11-26 LAB — COMPREHENSIVE METABOLIC PANEL
ALT: 36 U/L (ref 0–44)
AST: 25 U/L (ref 15–41)
Albumin: 4 g/dL (ref 3.5–5.0)
Alkaline Phosphatase: 36 U/L — ABNORMAL LOW (ref 38–126)
Anion gap: 6 (ref 5–15)
BUN: 20 mg/dL (ref 6–20)
CO2: 28 mmol/L (ref 22–32)
Calcium: 8.5 mg/dL — ABNORMAL LOW (ref 8.9–10.3)
Chloride: 102 mmol/L (ref 98–111)
Creatinine, Ser: 0.75 mg/dL (ref 0.44–1.00)
GFR, Estimated: >60 mL/min (ref >60)
Glucose, Bld: 104 mg/dL — ABNORMAL HIGH (ref 70–99)
Potassium: 3.4 mmol/L — ABNORMAL LOW (ref 3.5–5.1)
Sodium: 136 mmol/L (ref 135–145)
Total Bilirubin: 0.7 mg/dL (ref <1.2)
Total Protein: 6.4 g/dL — ABNORMAL LOW (ref 6.5–8.1)

## 2022-11-26 LAB — URINALYSIS, ROUTINE W REFLEX MICROSCOPIC
Bacteria, UA: NONE SEEN
Bilirubin Urine: NEGATIVE
Glucose, UA: NEGATIVE mg/dL
Ketones, ur: NEGATIVE mg/dL
Leukocytes,Ua: NEGATIVE
Nitrite: NEGATIVE
Protein, ur: NEGATIVE mg/dL
Specific Gravity, Urine: 1.034 — ABNORMAL HIGH (ref 1.005–1.030)
pH: 6 (ref 5.0–8.0)

## 2022-11-26 LAB — SEDIMENTATION RATE: Sed Rate: 2 mm/h (ref 0–22)

## 2022-11-26 LAB — LIPASE, BLOOD: Lipase: 25 U/L (ref 11–51)

## 2022-11-26 LAB — MAGNESIUM: Magnesium: 2 mg/dL (ref 1.7–2.4)

## 2022-11-26 LAB — C-REACTIVE PROTEIN: CRP: <0.5 mg/dL (ref <1.0)

## 2022-11-26 MED ORDER — IOHEXOL 300 MG/ML  SOLN
100.0000 mL | Freq: Once | INTRAMUSCULAR | Status: AC | PRN
  Administered 2022-11-26: 100 mL via INTRAVENOUS

## 2022-11-26 MED ORDER — ONDANSETRON 4 MG PO TBDP: 4.0000 mg | ORAL_TABLET | Freq: Three times a day (TID) | ORAL | 0 refills | Status: AC | PRN

## 2022-11-26 NOTE — Discharge Instructions (Signed)
You were seen for your abdominal pain and diarrhea in the emergency department.   At home, please continue the Imodium.  Take the Zofran for any nausea that you may have or vomiting.    Check your MyChart online for the results of any tests that had not resulted by the time you left the emergency department.   Follow-up with your primary doctor in 2-3 days regarding your visit.  Follow-up with your GI doctor soon as possible.  Return immediately to the emergency department if you experience any of the following: Worsening pain, fevers, or any other concerning symptoms.    Thank you for visiting our Emergency Department. It was a pleasure taking care of you today.

## 2022-11-26 NOTE — ED Provider Notes (Signed)
[place] EMERGENCY DEPARTMENT AT [facility] Provider Note   CSN: [id number] Arrival date & time: 11/26/22  0930     History  Chief Complaint  Patient presents with   Abdominal Pain    Catherine Short is a 62 y.o. female.  62 year old female with a history of microscopic colitis, SBO, and cecal volvulus status post right hemicolectomy who presents to the emergency department with diarrhea.  Patient reports that she was constipated her entire life until 2 years ago when she started having more frequent bowel movements.  Typically will have up to 5 bowel movements before going into work when she wakes up.  Says that they have started to become more frequent recently.  This weekend had over 10 bowel movements in 1 day.  Started to get better and then last night had approximately 14 bowel movements.  Occasionally they are greasy.  Black because she is taking Pepto-Bismol but no blood.  Not on blood thinners.  Tried taking some Imodium today as well.  No fevers, rashes, joint pains.  Says she is not currently on any other medications for her abdominal problems or diarrhea.       Home Medications Prior to Admission medications   Medication Sig Start Date End Date Taking? Authorizing Provider  ondansetron (ZOFRAN-ODT) 4 MG disintegrating tablet Take 1 tablet (4 mg total) by mouth every 8 (eight) hours as needed for nausea or vomiting. 11/26/22  Yes Rondel Baton, MD  acetaminophen (TYLENOL) 325 MG tablet Take 2 tablets (650 mg total) by mouth every 6 (six) hours as needed for mild pain (or Fever >/= 101). 11/01/18   Shon Hale, MD  Amino Acids (AMINO ACID PO) Take by mouth.    [provider]  B Complex Vitamins (B COMPLEX PO) Take by mouth.    [provider]  buPROPion (WELLBUTRIN XL) 300 MG 24 hr tablet TAKE 1 TABLET BY MOUTH EVERY  MORNING 06/03/22   Cyril Mourning A, NP  busPIRone (BUSPAR) 5 MG tablet Take 1 tablet (5 mg total) by mouth 3  (three) times daily. 01/08/22   Adline Potter, NP  Calcium Carbonate-Vit D-Min (CALTRATE PLUS PO) Take by mouth.    [provider]  cetirizine (ZYRTEC) 10 MG tablet Take 1 tablet (10 mg total) by mouth daily. Patient taking differently: Take 10 mg by mouth. 06/07/19   Gwenlyn Fudge, FNP  COLLAGEN PO Take 1 tablet by mouth every morning.     [provider]  diclofenac Sodium (VOLTAREN) 1 % GEL Apply 2 g topically daily as needed. 12/09/18   [provider]  fluticasone (FLONASE) 50 MCG/ACT nasal spray Place 2 sprays into both nostrils daily. 06/07/19   Gwenlyn Fudge, FNP  Linoleic Acid-Sunflower Oil (CLA PO) Take by mouth.    [provider]  Multiple Minerals-Vitamins (MULTI MEGA MINERALS PO) Take by mouth.    [provider]  nitrofurantoin, macrocrystal-monohydrate, (MACROBID) 100 MG capsule Take 1 capsule (100 mg total) by mouth 2 (two) times daily. 05/11/22   Adline Potter, NP  Nutritional Supplements (DHEA PO) Take 50 mg by mouth.    [provider]  Omega-3 Fatty Acids (FISH OIL PO) Take 1 capsule by mouth every evening. DHA and EFA-daily    [provider]  tretinoin (RETIN-A) 0.1 % cream Apply 1 application topically at bedtime. 10/28/18   [provider]  valACYclovir (VALTREX) 500 MG tablet Take 1 tablet (500 mg total) by mouth daily.  04/07/22   Adline Potter, NP  VEOZAH 45 MG TABS Take 1 tablet (45 mg total) by mouth daily. 07/13/22   Adline Potter, NP      Allergies    Cyclobenzaprine, Ibuprofen, Nsaids, Sulfa antibiotics, and Xyzal [levocetirizine]    Review of Systems   Review of Systems  Physical Exam Updated Vital Signs BP 105/75   Pulse 71   Temp 98.3 F (36.8 C) (Oral)   Resp 20   Ht 5\' 3"  (1.6 m)   Wt 60.3 kg   LMP 07/05/2016 (Approximate) Comment: neg upt  SpO2 97%   BMI 23.56 kg/m  Physical Exam Vitals and nursing note reviewed.  Constitutional:      General: She is  not in acute distress.    Appearance: She is well-developed.  HENT:     Head: Normocephalic and atraumatic.     Right Ear: External ear normal.     Left Ear: External ear normal.     Nose: Nose normal.  Eyes:     Extraocular Movements: Extraocular movements intact.     Conjunctiva/sclera: Conjunctivae normal.     Pupils: Pupils are equal, round, and reactive to light.  Pulmonary:     Effort: Pulmonary effort is normal. No respiratory distress.  Abdominal:     General: Abdomen is flat. There is no distension.     Palpations: Abdomen is soft. There is no mass.     Tenderness: There is abdominal tenderness (periumbilical). There is no guarding.  Musculoskeletal:     Cervical back: Normal range of motion and neck supple.     Right lower leg: No edema.     Left lower leg: No edema.  Skin:    General: Skin is warm and dry.  Neurological:     Mental Status: She is alert and oriented to person, place, and time. Mental status is at baseline.  Psychiatric:        Mood and Affect: Mood normal.     ED Results / Procedures / Treatments   Labs (all labs ordered are listed, but only abnormal results are displayed) Labs Reviewed  COMPREHENSIVE METABOLIC PANEL - Abnormal; Notable for the following components:      Result Value   Potassium 3.4 (*)    Glucose, Bld 104 (*)    Calcium 8.5 (*)    Total Protein 6.4 (*)    Alkaline Phosphatase 36 (*)    All other components within normal limits  URINALYSIS, ROUTINE W REFLEX MICROSCOPIC - Abnormal; Notable for the following components:   Specific Gravity, Urine 1.034 (*)    Hgb urine dipstick MODERATE (*)    All other components within normal limits  C DIFFICILE QUICK SCREEN W PCR REFLEX    GASTROINTESTINAL PANEL BY PCR, STOOL (REPLACES STOOL CULTURE)  LIPASE, BLOOD  CBC WITH DIFFERENTIAL/PLATELET  SEDIMENTATION RATE  C-REACTIVE PROTEIN  MAGNESIUM    EKG None  Radiology CT ABDOMEN PELVIS W CONTRAST  Result Date:  11/26/2022 CLINICAL DATA:  diarrhea, abodminal pain, hx of colitis. EXAM: CT ABDOMEN AND PELVIS WITH CONTRAST TECHNIQUE: Multidetector CT imaging of the abdomen and pelvis was performed using the standard protocol following bolus administration of intravenous contrast. RADIATION DOSE REDUCTION: This exam was performed according to the departmental dose-optimization program which includes automated exposure control, adjustment of the mA and/or kV according to patient size and/or use of iterative reconstruction technique. CONTRAST:  OMNIPAQUE IOHEXOL 300 MG/ML  SOLN COMPARISON:  CT abdomen/pelvis dated February 21, 2020.  FINDINGS: Lower chest: No acute abnormality. Bilateral breast implants in place. Hepatobiliary: The liver is stable in size and morphology. Stable 1.5 cm cyst at the posterior right hepatic lobe. A few additional scattered subcentimeter focal hypodensities are too small to definitively characterize and appear grossly unchanged. No new lesion identified. No gallstones, gallbladder wall thickening, or biliary dilatation. Pancreas: Unremarkable. No pancreatic ductal dilatation or surrounding inflammatory changes. Spleen: Normal in size without focal abnormality. Adrenals/Urinary Tract: Adrenal glands are unremarkable. Kidneys are normal, without renal calculi, focal lesion, or hydronephrosis. Bladder is unremarkable. Stomach/Bowel: Stomach is within normal limits. Postsurgical changes related to right hemicolectomy with anastomotic staple line noted in the right mid abdomen. Visualized small bowel loops are mildly distended with fluid. Moderate volume of stool in the transverse and descending colon. No evidence of obstruction. No focal inflammatory changes. Vascular/Lymphatic: The abdominal aorta is normal in caliber. A retroaortic left renal vein is noted. No enlarged abdominal or pelvic lymph nodes. Reproductive: Uterus and bilateral adnexa are unremarkable. Other: Small fat containing umbilical  hernia. No abdominopelvic ascites. No free air. Musculoskeletal: Degenerative disc disease at L2-L3 with similar grade 1 retrolisthesis of L2 on L3. No acute osseous abnormality. No suspicious osseous lesion. IMPRESSION: 1. Postoperative changes related to prior right hemicolectomy. No evidence of obstruction. Fluid-filled mildly distended distal small bowel and areas of air-fluid levels in the colon, which can be seen in setting of diarrheal illness. 2. Otherwise, no acute localizing findings in the abdomen or pelvis. 3. Additional unchanged ancillary findings, as described above. Electronically Signed   By: Hart Robinsons M.D.   On: 11/26/2022 14:37    Procedures Procedures    Medications Ordered in ED Medications  iohexol (OMNIPAQUE) 300 MG/ML solution 100 mL (100 mLs Intravenous Contrast Given 11/26/22 1155)    ED Course/ Medical Decision Making/ A&P                                 Medical Decision Making Amount and/or Complexity of Data Reviewed Labs: ordered. Radiology: ordered.  Risk Prescription drug management.   BIANCHA KRESSE is a 62 y.o. female with comorbidities that complicate the patient evaluation including microscopic colitis, SBO, and cecal volvulus status post right hemicolectomy who presents to the emergency department with diarrhea.   Initial Ddx:  Colitis, gastroenteritis, small bowel obstruction, ileus, appendicitis/infection  MDM/Course:  Patient presents to the emergency department with abdominal pain and diarrhea.  Has not had C. difficile no recent antibiotics.  Does have a history of colitis and chronic diarrhea.  Has some lower abdominal tenderness to palpation on exam.  Has already had a colectomy that involved her appendix so low concern for appendicitis.  CT scan obtained which did not show any acute findings aside from some mild sequelae of diarrheal illness.  No obvious colitis.  Patient did not have any additional diarrhea in the emergency  department.  Was not able to obtain a C. difficile or GI pathogen panel on the patient.  Suspect this may be due to her microscopic colitis or chronic diarrhea.  Also send inflammatory markers to evaluate for colitis which were WNL.  Will have her follow-up with GI as an outpatient.    This patient presents to the ED for concern of complaints listed in HPI, this involves an extensive number of treatment options, and is a complaint that carries with it a high risk of complications and morbidity. Disposition including potential  need for admission considered.   Dispo: DC Home. Return precautions discussed including, but not limited to, those listed in the AVS. Allowed pt time to ask questions which were answered fully prior to dc.  Records reviewed Outpatient Clinic Notes The following labs were independently interpreted: Chemistry and show no acute abnormality I independently reviewed the following imaging with scope of interpretation limited to determining acute life threatening conditions related to emergency care: CT Abdomen/Pelvis and agree with the radiologist interpretation with the following exceptions: none I personally reviewed and interpreted cardiac monitoring: normal sinus rhythm  I personally reviewed and interpreted the pt's EKG: see above for interpretation  I have reviewed the patients home medications and made adjustments as needed  Portions of this note were generated with Dragon dictation software. Dictation errors may occur despite best attempts at proofreading.           Final Clinical Impression(s) / ED Diagnoses Final diagnoses:  Diarrhea, unspecified type  Lower abdominal pain    Rx / DC Orders ED Discharge Orders          Ordered    ondansetron (ZOFRAN-ODT) 4 MG disintegrating tablet  Every 8 hours PRN        11/26/22 1500              Rondel Baton, MD 11/27/22 1008

## 2022-11-26 NOTE — ED Notes (Signed)
Pt states she will try to provide urine and stool specimen soon but isn't able to at the moment.

## 2022-11-26 NOTE — ED Triage Notes (Signed)
Pt has had abdominal/diarrhea mostly for around 2 yrs now after being more constipated the majority of her life. She, this past Friday was up and down all night she states with even more diarrhea than usual. States she was up at least 12 times overnight and it happened again last night, Wednesday night. She c/o nausea to left abdomen, and diarrhea. No vomiting presently, and no fevers as of yet.

## 2022-12-17 ENCOUNTER — Other Ambulatory Visit: Payer: Self-pay | Admitting: Adult Health

## 2022-12-22 DIAGNOSIS — M542 Cervicalgia: Secondary | ICD-10-CM | POA: Diagnosis not present

## 2023-01-21 DIAGNOSIS — M5030 Other cervical disc degeneration, unspecified cervical region: Secondary | ICD-10-CM | POA: Diagnosis not present

## 2023-01-21 DIAGNOSIS — M47812 Spondylosis without myelopathy or radiculopathy, cervical region: Secondary | ICD-10-CM | POA: Diagnosis not present

## 2023-01-21 DIAGNOSIS — M5412 Radiculopathy, cervical region: Secondary | ICD-10-CM | POA: Diagnosis not present

## 2023-01-21 DIAGNOSIS — M542 Cervicalgia: Secondary | ICD-10-CM | POA: Diagnosis not present

## 2023-03-08 DIAGNOSIS — M5412 Radiculopathy, cervical region: Secondary | ICD-10-CM | POA: Diagnosis not present

## 2023-03-16 DIAGNOSIS — M5412 Radiculopathy, cervical region: Secondary | ICD-10-CM | POA: Diagnosis not present

## 2023-03-23 DIAGNOSIS — M5412 Radiculopathy, cervical region: Secondary | ICD-10-CM | POA: Diagnosis not present

## 2023-03-30 DIAGNOSIS — M5412 Radiculopathy, cervical region: Secondary | ICD-10-CM | POA: Diagnosis not present

## 2023-04-09 ENCOUNTER — Other Ambulatory Visit: Payer: Self-pay | Admitting: Adult Health

## 2023-04-17 ENCOUNTER — Other Ambulatory Visit: Payer: Self-pay | Admitting: Adult Health

## 2023-04-27 ENCOUNTER — Encounter: Payer: Self-pay | Admitting: Adult Health

## 2023-04-27 ENCOUNTER — Ambulatory Visit: Payer: BC Managed Care – PPO | Admitting: Adult Health

## 2023-04-27 VITALS — BP 113/77 | HR 61 | Ht 63.0 in | Wt 135.5 lb

## 2023-04-27 DIAGNOSIS — F419 Anxiety disorder, unspecified: Secondary | ICD-10-CM | POA: Diagnosis not present

## 2023-04-27 DIAGNOSIS — R7989 Other specified abnormal findings of blood chemistry: Secondary | ICD-10-CM

## 2023-04-27 DIAGNOSIS — Z01419 Encounter for gynecological examination (general) (routine) without abnormal findings: Secondary | ICD-10-CM | POA: Diagnosis not present

## 2023-04-27 DIAGNOSIS — R5383 Other fatigue: Secondary | ICD-10-CM | POA: Diagnosis not present

## 2023-04-27 DIAGNOSIS — Z13220 Encounter for screening for lipoid disorders: Secondary | ICD-10-CM | POA: Diagnosis not present

## 2023-04-27 DIAGNOSIS — Z1329 Encounter for screening for other suspected endocrine disorder: Secondary | ICD-10-CM

## 2023-04-27 DIAGNOSIS — Z131 Encounter for screening for diabetes mellitus: Secondary | ICD-10-CM

## 2023-04-27 DIAGNOSIS — N941 Unspecified dyspareunia: Secondary | ICD-10-CM | POA: Insufficient documentation

## 2023-04-27 DIAGNOSIS — F32A Depression, unspecified: Secondary | ICD-10-CM

## 2023-04-27 DIAGNOSIS — Z1331 Encounter for screening for depression: Secondary | ICD-10-CM

## 2023-04-27 DIAGNOSIS — Z1321 Encounter for screening for nutritional disorder: Secondary | ICD-10-CM

## 2023-04-27 DIAGNOSIS — R232 Flushing: Secondary | ICD-10-CM | POA: Diagnosis not present

## 2023-04-27 MED ORDER — IMVEXXY MAINTENANCE PACK 10 MCG VA INST: 1.0000 | VAGINAL_INSERT | Freq: Every day | VAGINAL | Status: DC

## 2023-04-27 NOTE — Progress Notes (Signed)
 Patient ID: Catherine Short, female   DOB: 1960/10/23, 63 y.o.   MRN: [id number] History of Present Illness: Catherine Short is a 63 year old white female, married, PM in for a well woman gyn exam. She is doing well on Wellbutrin  and Buspar , still has some anxiety,(work related). She has pain with sex, and is having diarrhea, using imodium  and sees GI. Hot flashes are much better, has stopped veozah . She is tired.      Component Value Date/Time   DIAGPAP  04/07/2022 0908    - Negative for intraepithelial lesion or malignancy (NILM)   DIAGPAP (A) 02/24/2021 1017    - Atypical squamous cells of undetermined significance (ASC-US )   DIAGPAP  08/19/2016 0000    NEGATIVE FOR INTRAEPITHELIAL LESIONS OR MALIGNANCY.   HPVHIGH Negative 04/07/2022 0908   HPVHIGH Negative 02/24/2021 1017   ADEQPAP  04/07/2022 0908    Satisfactory for evaluation; transformation zone component PRESENT.   ADEQPAP  02/24/2021 1017    Satisfactory for evaluation; transformation zone component PRESENT.   ADEQPAP  08/19/2016 0000    Satisfactory for evaluation  endocervical/transformation zone component PRESENT.     Current Medications, Allergies, Past Medical History, Past Surgical History, Family History and Social History were reviewed in [employer] record.     Review of Systems: Patient denies any headaches, hearing loss,  blurred vision, shortness of breath, chest pain, abdominal pain, problems with urination. No joint pain or mood swings.  See HPI for positives.    Physical Exam:BP 113/77 (BP Location: Right Arm, Patient Position: Sitting, Cuff Size: Normal)   Pulse 61   Ht 5\' 3"  (1.6 m)   Wt 135 lb 8 oz (61.5 kg)   LMP 07/05/2016 (Approximate) Comment: neg upt  BMI 24.00 kg/m   General:  Well developed, well nourished, no acute distress Skin:  Warm and dry Neck:  Midline trachea, normal thyroid , good ROM, no lymphadenopathy,no carotid bruits heard  Lungs; Clear to auscultation  bilaterally Breast:  No dominant palpable mass, retraction, or nipple discharge, has bilateral implants Cardiovascular: Regular rate and rhythm Abdomen:  Soft, non tender, no hepatosplenomegaly Pelvic:  External genitalia is normal in appearance, no lesions.  The vagina is normal in appearance. Urethra has no lesions or masses. The cervix is smooth.  Uterus is felt to be normal size, shape, and contour.  No adnexal masses or tenderness noted.Bladder is non tender, no masses felt. Rectal: Deferred Extremities/musculoskeletal:  No swelling or varicosities noted, no clubbing or cyanosis Psych:  No mood changes, alert and cooperative,seems happy AA is 1  Fall risk is low    04/27/2023    8:41 AM 04/07/2022    8:57 AM 05/23/2021   10:05 AM  Depression screen PHQ 2/9  Decreased Interest 1 1 0  Down, Depressed, Hopeless 1 1 1   PHQ - 2 Score 2 2 1   Altered sleeping 2 3 0  Tired, decreased energy 3 1 1   Change in appetite 1 0 0  Feeling bad or failure about yourself  1 0 2  Trouble concentrating 0 0 0  Moving slowly or fidgety/restless 0 0 3  Suicidal thoughts 0 0 0  PHQ-9 Score 9 6 7        04/27/2023    8:41 AM 04/07/2022    8:57 AM 05/23/2021   10:07 AM 02/24/2021    9:58 AM  GAD 7 : Generalized Anxiety Score  Nervous, Anxious, on Edge 2 2 3 3   Control/stop worrying 2 2 3  3  Worry too much - different things 2 2 3 3   Trouble relaxing 2 2 3 2   Restless 2 2 3 2   Easily annoyed or irritable 2 1 3 3   Afraid - awful might happen 0 2 0 1  Total GAD 7 Score 12 13 18 17     Upstream - 04/27/23 0840       Pregnancy Intention Screening   Does the patient want to become pregnant in the next year? N/A    Does the patient's partner want to become pregnant in the next year? N/A    Would the patient like to discuss contraceptive options today? N/A      Contraception Wrap Up   Current Method Vasectomy   PM   End Method Vasectomy   PM   Contraception Counseling Provided No              [place]  exam with Doria Garden NP student  Impression and plan: 1. Encounter for well woman exam with routine gynecological exam (Primary) Pap in 2027 Physical in 1 year Check CBC and CMP 2. Dyspareunia, female Will try imvexxy , 8 samples given to use nightly and let me know if helps, will rx Meds ordered this encounter  Medications   Estradiol  (IMVEXXY  MAINTENANCE PACK) 10 MCG INST    Sig: Place 1 Insert vaginally at bedtime. For 2 weeks    Supervising Provider:   Evalyn Hillier H [[id number]]     3. Anxiety and depression Doing well on Wellbutrin  XL 300 mg 1 daily and Buspar  5 mg can increase to tid or 7.5 mg bid Has refills   4. Hot flashes Much better   5. Tired Will check labs  - TSH + free T4 - VITAMIN D  25 Hydroxy (Vit-D Deficiency, Fractures)  6. Screening cholesterol level  - Lipid panel  7. Screening for thyroid  disorder  - TSH + free T4  8. Screening for diabetes mellitus - Hemoglobin A1c  9. Encounter for vitamin deficiency  - VITAMIN D  25 Hydroxy (Vit-D Deficiency, Fractures)  10. Elevated prolactin level - Prolactin

## 2023-04-28 LAB — TSH+FREE T4
Free T4: 1.38 ng/dL (ref 0.82–1.77)
TSH: 1.09 u[IU]/mL (ref 0.450–4.500)

## 2023-04-28 LAB — VITAMIN D 25 HYDROXY (VIT D DEFICIENCY, FRACTURES): Vit D, 25-Hydroxy: 117 ng/mL — ABNORMAL HIGH (ref 30.0–100.0)

## 2023-04-28 LAB — COMPREHENSIVE METABOLIC PANEL WITH GFR
ALT: 18 IU/L (ref 0–32)
AST: 21 IU/L (ref 0–40)
Albumin: 4.4 g/dL (ref 3.8–4.9)
Alkaline Phosphatase: 45 IU/L (ref 44–121)
BUN/Creatinine Ratio: 20 (ref 12–28)
BUN: 18 mg/dL (ref 8–27)
Bilirubin Total: 0.5 mg/dL (ref 0.0–1.2)
CO2: 24 mmol/L (ref 20–29)
Calcium: 9.5 mg/dL (ref 8.7–10.3)
Chloride: 104 mmol/L (ref 96–106)
Creatinine, Ser: 0.89 mg/dL (ref 0.57–1.00)
Globulin, Total: 1.9 g/dL (ref 1.5–4.5)
Glucose: 89 mg/dL (ref 70–99)
Potassium: 4.7 mmol/L (ref 3.5–5.2)
Sodium: 141 mmol/L (ref 134–144)
Total Protein: 6.3 g/dL (ref 6.0–8.5)
eGFR: 74 mL/min/{1.73_m2} (ref >59)

## 2023-04-28 LAB — LIPID PANEL
Chol/HDL Ratio: 1.8 ratio (ref 0.0–4.4)
Cholesterol, Total: 180 mg/dL (ref 100–199)
HDL: 98 mg/dL (ref >39)
LDL Chol Calc (NIH): 74 mg/dL (ref 0–99)
Triglycerides: 38 mg/dL (ref 0–149)
VLDL Cholesterol Cal: 8 mg/dL (ref 5–40)

## 2023-04-28 LAB — CBC
Hematocrit: 40.7 % (ref 34.0–46.6)
Hemoglobin: 13.6 g/dL (ref 11.1–15.9)
MCH: 31.9 pg (ref 26.6–33.0)
MCHC: 33.4 g/dL (ref 31.5–35.7)
MCV: 95 fL (ref 79–97)
Platelets: 207 10*3/uL (ref 150–450)
RBC: 4.27 x10E6/uL (ref 3.77–5.28)
RDW: 12.5 % (ref 11.7–15.4)
WBC: 4.8 10*3/uL (ref 3.4–10.8)

## 2023-04-28 LAB — HEMOGLOBIN A1C
Est. average glucose Bld gHb Est-mCnc: 103 mg/dL
Hgb A1c MFr Bld: 5.2 % (ref 4.8–5.6)

## 2023-04-28 LAB — PROLACTIN: Prolactin: 13.5 ng/mL (ref 3.6–25.2)

## 2023-05-03 ENCOUNTER — Other Ambulatory Visit: Payer: Self-pay | Admitting: Adult Health

## 2023-05-03 MED ORDER — IMVEXXY MAINTENANCE PACK 10 MCG VA INST: VAGINAL_INSERT | VAGINAL | 6 refills | Status: DC

## 2023-05-03 NOTE — Progress Notes (Signed)
 Will rx imvexxy 

## 2023-05-11 DIAGNOSIS — Z1231 Encounter for screening mammogram for malignant neoplasm of breast: Secondary | ICD-10-CM | POA: Diagnosis not present

## 2023-09-13 ENCOUNTER — Ambulatory Visit (INDEPENDENT_AMBULATORY_CARE_PROVIDER_SITE_OTHER): Admitting: Gastroenterology

## 2023-09-13 ENCOUNTER — Encounter: Payer: Self-pay | Admitting: Gastroenterology

## 2023-09-13 VITALS — BP 102/68 | HR 82 | Ht 63.0 in | Wt 136.0 lb

## 2023-09-13 DIAGNOSIS — K52831 Collagenous colitis: Secondary | ICD-10-CM | POA: Diagnosis not present

## 2023-09-13 DIAGNOSIS — K529 Noninfective gastroenteritis and colitis, unspecified: Secondary | ICD-10-CM

## 2023-09-13 DIAGNOSIS — Z9049 Acquired absence of other specified parts of digestive tract: Secondary | ICD-10-CM | POA: Diagnosis not present

## 2023-09-13 DIAGNOSIS — R197 Diarrhea, unspecified: Secondary | ICD-10-CM

## 2023-09-13 MED ORDER — CHOLESTYRAMINE 4 G PO PACK: 4.0000 g | PACK | Freq: Two times a day (BID) | ORAL | 1 refills | Status: DC

## 2023-09-13 NOTE — Patient Instructions (Addendum)
 We have sent the following medications to your pharmacy for you to pick up at your convenience: cholestyramine .   You have been given a low-fodmap diet.   _______________________________________________________  If your blood pressure at your visit was 140/90 or greater, please contact your primary care physician to follow up on this.  _______________________________________________________  If you are age 61 or older, your body mass index should be between 23-30. Your Body mass index is 24.09 kg/m. If this is out of the aforementioned range listed, please consider follow up with your Primary Care Provider.  If you are age 47 or younger, your body mass index should be between 19-25. Your Body mass index is 24.09 kg/m. If this is out of the aformentioned range listed, please consider follow up with your Primary Care Provider.   ________________________________________________________  The [facility] providers would like to encourage you to use MYCHART to communicate with providers for non-urgent requests or questions.  Due to long hold times on the telephone, sending your provider a message by [facility] may be a faster and more efficient way to get a response.  Please allow 48 business hours for a response.  Please remember that this is for non-urgent requests.  _______________________________________________________  [facility] is using a team-based approach to care.  Your team is made up of your doctor and two to three APPS. Our APPS (Nurse Practitioners and Physician Assistants) work with your physician to ensure care continuity for you. They are fully qualified to address your health concerns and develop a treatment plan. They communicate directly with your gastroenterologist to care for you. Seeing the Advanced Practice Practitioners on your physician's team can help you by facilitating care more promptly, often allowing for earlier appointments, access to diagnostic testing,  procedures, and other specialty referrals.

## 2023-09-13 NOTE — Progress Notes (Signed)
 Discussed the use of AI scribe software for clinical note transcription with the patient, who gave verbal consent to proceed.  HPI : Catherine Short is a 63 year old female with a history of anxiety, questionable history of microscopic colitis, history of cecal volvulus status post right hemicolectomy who presents for further management of chronic diarrhea.  She was previously followed by Dr. Eda, last seen by her in June 2023, at which time budesonide  was stopped for her diarrhea as it was not working, and she was started on Lialda  (which she had a reported clinical response to in the past).  She has a lifelong history of constipation, which transitioned to diarrhea a few years ago.   The surgery involved the removal of 40% of her colon, including the appendix. Post-surgery, she experienced diarrhea for two to three months, which then resolved, and she returned to a constipated state for the next few years.  Her diarrhea seems to have started in late 2022/early 2023  She has tried various medications including Lialda , budesonide , Bentyl , and Imodium , with limited success. She takes four tablets of Imodium  daily, sometimes more, but notes it does not significantly help. Her bowel movements start normal in the morning but become loose, with urgency and occasional accidents, occurring about five times a day on average. She experiences bloating, gas, and a sensation of 'stomach rolling'.  Her stools are often green in color at the end of the day.  She reports urgency with bowel movements and occasional accidents.  Very rarely has nocturnal stools.  Weight is stable.  No blood in stool.  A colonoscopy in 2011 biopsy previously revealed collagenous colitis, which initially responded to Lialda  but not in recent attempts. She has not been on NSAIDs or other medications known to exacerbate colitis. She has stopped taking vitamins and supplements 2 months ago as they seemed to pass through undigested, but  she has not noticed any improvement in her diarrhea since stopping these vitamins. Her diet has been adjusted to avoid foods that exacerbate symptoms, such as salads and raw vegetables, which cause severe discomfort.  She occasionally consumes diet sodas but not in significant amounts (perhaps one/week).         Dr. Eda last note, June 2023 Chronic diarrhea    - recent studies negative including fecal calprotectin, pancreatic elastase, giardia    - normal duodenal biopsies on EGD 2021    - remote history of microscopic colitis History of acute CT-diagnosed enteritis with associated ileus presenting with severe pain 02/21/20 Chronic constipation    - No response to Miralax daily, Citrucel, magnesium  oxide, swiss chris, Amitiza, Linzess  PRN Chronic nausea - may be related to constipation History of NSAID related gastric ulcers, resolved on follow-up EGD History of iron deficiency anemia     - labs 4/21 were normal Internal hemorrhoids History of cecal volvulus status post right hemicolectomy 02/2018 Partial small bowel obstruction due to adhesions, hospitalized at [facility] 08/2018 Remote diagnosis of microscopic colitis in 2011 No polyps on colonoscopy 12/19/2018 (was not having diarrhea at the time, no colonic biopsies taken)    - colonoscopy recommended again in 10 years    Chronic diarrhea with history of microscopic colitis. Eight months of symptoms despite empiric trial of budesonide . Fecal calprotectin, ESR, and CRP were normal. Giardia testing was negative. Duodenal biopsies were normal on EGD 2021. Will try Lialda  as she remembers it dramatically helping her diarrhea in 2011.    Chronic constipation, previously not responding  to Miralax daily, Citrucel, magnesium  oxide, swiss chris, Amitiza, or Linzess  to 290 mcg daily. Not an issue since her diarrhea started.    Nausea: Resolved on omeprazole . Temporally associated with progressive constipation. May reflect diffuse GI  dysmotility disorder.    H pylori negative gastritis, reflux, and PUD: Clinical improvement noted on last EGD. Symptoms controlled on omeprazole  40 mg QD. Avoid all NSAIDs.    History of iron deficiency anemia: Likely due to history of NSAID-related PUD and gastritis. Labs show resolution.     CT Abdomen/pelvis Nov 26, 2022 Indication:  Diarrhea IMPRESSION: 1. Postoperative changes related to prior right hemicolectomy. No evidence of obstruction. Fluid-filled mildly distended distal small bowel and areas of air-fluid levels in the colon, which can be seen in setting of diarrheal illness. 2. Otherwise, no acute localizing findings in the abdomen or pelvis. 3. Additional unchanged ancillary findings, as described above.    Past Medical History:  Diagnosis Date   Allergy    Anemia    d/t bleeding ulcers   Anxiety    Arthritis    ASCUS of cervix with negative high risk HPV 02/28/2021   02/28/21 repeat in 3 years per [facility], 5 year CIN3+ risk is 0.27%   Chronic constipation    Colitis    Depression    Esophagitis    Fibroid    Gastritis    GERD (gastroesophageal reflux disease)    H/O cold sores    H/O removal of cyst 10/2018   right arm    Headache    Heart murmur    when I was 18   Hormone replacement therapy (HRT) 11/14/2014   Hot flashes 11/14/2014   Increased endometrial stripe thickness 05/30/2015   Will get biopsy   Insomnia    Liver cyst    Menopause 11/14/2014   Moody 05/23/2015   Nerves 11/14/2014   Night sweats 11/14/2014   PMB (postmenopausal bleeding) 05/23/2015   Prolactinoma (HCC)    Rectocele 05/23/2015   Rotator cuff syndrome of left shoulder 10/30/2010   Stomach ulcer    Upper GI bleed 11/01/2018   due to ulcers     Past Surgical History:  Procedure Laterality Date   ABDOMINAL SURGERY     APPENDECTOMY     AUGMENTATION MAMMAPLASTY  2000   saline implants   BIOPSY  11/01/2018   Procedure: BIOPSY;  Surgeon: Harvey Margo CROME, MD;  Location: [facility];  Service: Endoscopy;;   BREAST ENHANCEMENT SURGERY  1996   CARPAL TUNNEL RELEASE     COLONOSCOPY  12/2018   ESOPHAGOGASTRODUODENOSCOPY (EGD) WITH PROPOFOL  N/A 11/01/2018   Procedure: ESOPHAGOGASTRODUODENOSCOPY (EGD) WITH PROPOFOL ;  Surgeon: Harvey Margo CROME, MD;  Location: [facility];  Service: Endoscopy;  Laterality: N/A;   LAPAROSCOPIC RIGHT HEMI COLECTOMY Right 02/27/2018   Procedure: LAPAROSCOPIC RIGHT HEMI COLECTOMY;  Surgeon: Mavis Anes, MD;  Location: [facility];  Service: General;  Laterality: Right;   LAPAROTOMY N/A 02/27/2018   Procedure: EXPLORATORY LAPAROTOMY;  Surgeon: Mavis Anes, MD;  Location: [facility];  Service: General;  Laterality: N/A;   TONSILECTOMY, ADENOIDECTOMY, BILATERAL MYRINGOTOMY AND TUBES     UPPER GASTROINTESTINAL ENDOSCOPY     WRIST SURGERY Left 09/04/2020   Family History  Problem Relation Age of Onset   Colon cancer Paternal Grandfather        Diagnosed in late 68s.    Uterine cancer Maternal Grandmother    Emphysema Maternal Grandfather    Colon polyps Maternal Grandfather  Heart attack Father    Heart disease Father    Other Mother        digestive type issues   Diabetes Mother    Hypertension Mother    Early death Brother        MVA   Heart attack Brother    Alcohol abuse Brother    Esophageal cancer Neg Hx    Rectal cancer Neg Hx    Stomach cancer Neg Hx    Social History   Tobacco Use   Smoking status: Former    Current packs/day: 0.00    Types: Cigarettes    Start date: 03/07/1987    Quit date: 03/06/1993    Years since quitting: 30.5   Smokeless tobacco: Never  Vaping Use   Vaping status: Never Used  Substance Use Topics   Alcohol use: Yes    Alcohol/week: 2.0 standard drinks of alcohol    Types: 2 Glasses of wine per week    Comment: twice a week wine   Drug use: No   Current Outpatient Medications  Medication Sig Dispense Refill   acetaminophen  (TYLENOL ) 325 MG tablet Take 2 tablets (650 mg total) by mouth every 6  (six) hours as needed for mild pain (or Fever >/= 101). 12 tablet 0   Amino Acids (AMINO ACID PO) Take by mouth.     B Complex Vitamins (B COMPLEX PO) Take by mouth.     buPROPion  (WELLBUTRIN  XL) 300 MG 24 hr tablet TAKE 1 TABLET BY MOUTH IN THE  MORNING 90 tablet 3   busPIRone  (BUSPAR ) 5 MG tablet TAKE 1 TABLET BY MOUTH 3 TIMES  DAILY 270 tablet 0   Calcium Carbonate-Vit D-Min (CALTRATE PLUS PO) Take by mouth.     cetirizine  (ZYRTEC ) 10 MG tablet Take 1 tablet (10 mg total) by mouth daily. (Patient taking differently: Take 10 mg by mouth daily as needed.) 90 tablet 1   COLLAGEN PO Take 1 tablet by mouth every morning.      diclofenac Sodium (VOLTAREN) 1 % GEL Apply 2 g topically daily as needed.     fluticasone  (FLONASE ) 50 MCG/ACT nasal spray Place 2 sprays into both nostrils daily. 32 g 3   Linoleic Acid-Sunflower Oil (CLA PO) Take by mouth.     Multiple Minerals-Vitamins (MULTI MEGA MINERALS PO) Take by mouth.     Omega-3 Fatty Acids (FISH OIL PO) Take 1 capsule by mouth every evening. DHA and EFA-daily     ondansetron  (ZOFRAN -ODT) 4 MG disintegrating tablet Take 1 tablet (4 mg total) by mouth every 8 (eight) hours as needed for nausea or vomiting. 12 tablet 0   tretinoin (RETIN-A) 0.1 % cream Apply 1 application topically at bedtime.     valACYclovir  (VALTREX ) 500 MG tablet Take 1 tablet (500 mg total) by mouth daily. (Patient taking differently: Take 500 mg by mouth as needed.) 90 tablet 3   No current facility-administered medications for this visit.   Allergies  Allergen Reactions   Cyclobenzaprine  Other (See Comments)    disoriented  Altered mental status   Ibuprofen Other (See Comments)   Nsaids Other (See Comments)    Avoid use due to gastric ulcers.    Sulfa  Antibiotics    Xyzal [Levocetirizine] Other (See Comments)    Headache     Review of Systems: All systems reviewed and negative except where noted in HPI.    No results found.  Physical Exam: BP 102/68    Pulse 82   Ht 5'  3 (1.6 m)   Wt 136 lb (61.7 kg)   LMP 07/05/2016 (Approximate) Comment: neg upt  SpO2 99%   BMI 24.09 kg/m  Constitutional: Pleasant,well-developed, Caucasian female in no acute distress.  Accompanied by husband HEENT: Normocephalic and atraumatic. Conjunctivae are normal. No scleral icterus. Neck supple.  Cardiovascular: Normal rate, regular rhythm.  Pulmonary/chest: Effort normal and breath sounds normal. No wheezing, rales or rhonchi. Abdominal: Soft, nondistended, nontender. Bowel sounds active throughout. There are no masses palpable. No hepatomegaly. Extremities: no edema Neurological: Alert and oriented to person place and time. Skin: Skin is warm and dry. No rashes noted. Psychiatric: Normal mood and affect. Behavior is normal.  CBC    Component Value Date/Time   WBC 4.8 04/27/2023 0954   WBC 5.4 11/26/2022 1008   RBC 4.27 04/27/2023 0954   RBC 4.18 11/26/2022 1008   HGB 13.6 04/27/2023 0954   HCT 40.7 04/27/2023 0954   PLT 207 04/27/2023 0954   MCV 95 04/27/2023 0954   MCH 31.9 04/27/2023 0954   MCH 33.0 11/26/2022 1008   MCHC 33.4 04/27/2023 0954   MCHC 34.7 11/26/2022 1008   RDW 12.5 04/27/2023 0954   LYMPHSABS 1.7 11/26/2022 1008   LYMPHSABS 2.2 01/03/2019 1613   MONOABS 0.5 11/26/2022 1008   EOSABS 0.1 11/26/2022 1008   EOSABS 0.1 01/03/2019 1613   BASOSABS 0.0 11/26/2022 1008   BASOSABS 0.0 01/03/2019 1613    CMP     Component Value Date/Time   NA 141 04/27/2023 0954   K 4.7 04/27/2023 0954   CL 104 04/27/2023 0954   CO2 24 04/27/2023 0954   GLUCOSE 89 04/27/2023 0954   GLUCOSE 104 (H) 11/26/2022 1008   BUN 18 04/27/2023 0954   CREATININE 0.89 04/27/2023 0954   CALCIUM 9.5 04/27/2023 0954   PROT 6.3 04/27/2023 0954   ALBUMIN 4.4 04/27/2023 0954   AST 21 04/27/2023 0954   ALT 18 04/27/2023 0954   ALKPHOS 45 04/27/2023 0954   BILITOT 0.5 04/27/2023 0954   GFRNONAA >60 11/26/2022 1008   GFRAA 91 11/21/2018 0808        Latest Ref Rng & Units 04/27/2023    9:54 AM 11/26/2022   10:08 AM 04/07/2022    9:48 AM  CBC EXTENDED  WBC 3.4 - 10.8 x10E3/uL 4.8  5.4  5.3   RBC 3.77 - 5.28 x10E6/uL 4.27  4.18  4.41   Hemoglobin 11.1 - 15.9 g/dL 86.3  86.1  85.7   HCT 34.0 - 46.6 % 40.7  39.8  43.2   Platelets 150 - 450 x10E3/uL 207  210  220   NEUT# 1.7 - 7.7 K/uL  3.0    Lymph# 0.7 - 4.0 K/uL  1.7        ASSESSMENT AND PLAN:    63 year old female with history of chronic, longstanding constipation, but also with history of collagenous colitis diagnosed on colonoscopy in 2011, who experienced cecal volvulus in 2020, status post right hemicolectomy, who has been having persistent diarrhea for the past 2 to 3 years.  Empiric treatment with budesonide  and Lialda  did not improve diarrhea.  She is not on any medications known to exacerbate collagenous colitis.  Treatment with Pepto-Bismol and Imodium  with limited improvement. Although her diarrhea did not start for several years after her surgery, query whether patient may have bile acid induced diarrhea given she has had resection of her ileocecal valve and part of terminal ileum.  Chronic diarrhea, hx of collagenous colitis, history of R hemicolectomy  Chronic diarrhea possibly due to bile salt diarrhea. Previous empiric treatments for collagenous colitis ineffective.  She denies being treated with any bile acid binders previously. - Initiate cholestyramine  twice daily for 6-8 weeks. Discussed potential side effects, including nausea and interference with absorption of other medications. - Provide low FODMAP diet handout. - Reassess symptoms after 6-8 weeks. - Consider alternative treatments or further diagnostic evaluations, such as a repeat colonoscopy with biopsies or empiric treatment for diarrhea predominant IBS, if symptoms persist.  History of microscopic colitis Current symptoms unresponsive to mesalamine  or budesonide , suggesting different etiology, as discussed  above.  Recording duration: 17 minutes     Casen Pryor E. Stacia, MD [facility]     No ref. provider found

## 2023-10-31 ENCOUNTER — Encounter: Payer: Self-pay | Admitting: Gastroenterology

## 2023-11-01 ENCOUNTER — Other Ambulatory Visit: Payer: Self-pay | Admitting: Adult Health

## 2023-11-01 MED ORDER — BUSPIRONE HCL 10 MG PO TABS: 10.0000 mg | ORAL_TABLET | Freq: Three times a day (TID) | ORAL | 2 refills | Status: AC

## 2023-11-01 NOTE — Progress Notes (Signed)
 Refilled buspar  10 mg

## 2023-11-09 ENCOUNTER — Other Ambulatory Visit: Payer: Self-pay | Admitting: Gastroenterology

## 2023-11-09 ENCOUNTER — Other Ambulatory Visit: Payer: Self-pay

## 2023-11-09 MED ORDER — CHOLESTYRAMINE 4 G PO PACK: 4.0000 g | PACK | Freq: Two times a day (BID) | ORAL | 3 refills | Status: DC

## 2023-11-16 ENCOUNTER — Ambulatory Visit: Admitting: Gastroenterology
